# Patient Record
Sex: Male | Born: 1960 | Race: White | Hispanic: No | Marital: Married | Smoking: Former smoker
Health system: Southern US, Community
[De-identification: ages and names within clinical notes are randomized; demographics above are authoritative.]

## PROBLEM LIST (undated history)

## (undated) DIAGNOSIS — E669 Obesity, unspecified: Secondary | ICD-10-CM

## (undated) DIAGNOSIS — E119 Type 2 diabetes mellitus without complications: Secondary | ICD-10-CM

## (undated) DIAGNOSIS — E7800 Pure hypercholesterolemia, unspecified: Secondary | ICD-10-CM

## (undated) DIAGNOSIS — I2510 Atherosclerotic heart disease of native coronary artery without angina pectoris: Secondary | ICD-10-CM

## (undated) DIAGNOSIS — M1990 Unspecified osteoarthritis, unspecified site: Secondary | ICD-10-CM

## (undated) DIAGNOSIS — K219 Gastro-esophageal reflux disease without esophagitis: Secondary | ICD-10-CM

## (undated) DIAGNOSIS — I10 Essential (primary) hypertension: Secondary | ICD-10-CM

## (undated) HISTORY — PX: BACK SURGERY: SHX140

## (undated) HISTORY — DX: Gastro-esophageal reflux disease without esophagitis: K21.9

## (undated) HISTORY — PX: CORONARY ANGIOPLASTY WITH STENT PLACEMENT: SHX49

## (undated) HISTORY — PX: TIBIA HARDWARE REMOVAL: SUR1133

## (undated) HISTORY — PX: LEG SURGERY: SHX1003

## (undated) HISTORY — DX: Unspecified osteoarthritis, unspecified site: M19.90

---

## 1998-11-04 ENCOUNTER — Encounter: Admission: RE | Admit: 1998-11-04 | Discharge: 1999-02-02 | Payer: Self-pay | Admitting: Family Medicine

## 1999-06-10 ENCOUNTER — Encounter: Admission: RE | Admit: 1999-06-10 | Discharge: 1999-09-08 | Payer: Self-pay | Admitting: Family Medicine

## 1999-07-14 ENCOUNTER — Encounter: Payer: Self-pay | Admitting: Family Medicine

## 1999-07-14 ENCOUNTER — Encounter: Admission: RE | Admit: 1999-07-14 | Discharge: 1999-07-14 | Payer: Self-pay | Admitting: Family Medicine

## 1999-08-11 ENCOUNTER — Encounter: Admission: RE | Admit: 1999-08-11 | Discharge: 1999-08-11 | Payer: Self-pay | Admitting: Family Medicine

## 1999-08-11 ENCOUNTER — Encounter: Payer: Self-pay | Admitting: Family Medicine

## 1999-09-10 ENCOUNTER — Encounter: Payer: Self-pay | Admitting: Family Medicine

## 1999-09-10 ENCOUNTER — Encounter: Admission: RE | Admit: 1999-09-10 | Discharge: 1999-09-10 | Payer: Self-pay | Admitting: Family Medicine

## 1999-09-14 ENCOUNTER — Encounter: Admission: RE | Admit: 1999-09-14 | Discharge: 1999-09-14 | Payer: Self-pay | Admitting: Family Medicine

## 1999-09-14 ENCOUNTER — Encounter: Payer: Self-pay | Admitting: Family Medicine

## 2000-02-07 ENCOUNTER — Ambulatory Visit (HOSPITAL_COMMUNITY): Admission: RE | Admit: 2000-02-07 | Discharge: 2000-02-07 | Payer: Self-pay | Admitting: Neurosurgery

## 2000-02-07 ENCOUNTER — Encounter: Payer: Self-pay | Admitting: Neurosurgery

## 2001-09-19 HISTORY — PX: ORIF PROXIMAL TIBIAL PLATEAU FRACTURE: SUR953

## 2001-09-19 HISTORY — PX: DECOMPRESSION FASCIOTOMY LEG: SUR403

## 2001-12-07 ENCOUNTER — Encounter: Admission: RE | Admit: 2001-12-07 | Discharge: 2001-12-07 | Payer: Self-pay | Admitting: Gastroenterology

## 2001-12-07 ENCOUNTER — Encounter: Payer: Self-pay | Admitting: Gastroenterology

## 2002-03-13 ENCOUNTER — Encounter: Admission: RE | Admit: 2002-03-13 | Discharge: 2002-06-11 | Payer: Self-pay | Admitting: Family Medicine

## 2002-05-11 ENCOUNTER — Inpatient Hospital Stay (HOSPITAL_COMMUNITY): Admission: EM | Admit: 2002-05-11 | Discharge: 2002-05-14 | Payer: Self-pay | Admitting: Emergency Medicine

## 2002-05-11 ENCOUNTER — Encounter: Payer: Self-pay | Admitting: Orthopedic Surgery

## 2002-05-24 ENCOUNTER — Inpatient Hospital Stay (HOSPITAL_COMMUNITY): Admission: RE | Admit: 2002-05-24 | Discharge: 2002-05-29 | Payer: Self-pay | Admitting: Orthopedic Surgery

## 2002-05-24 ENCOUNTER — Encounter: Payer: Self-pay | Admitting: Orthopedic Surgery

## 2002-05-26 ENCOUNTER — Encounter: Payer: Self-pay | Admitting: Orthopedic Surgery

## 2002-08-09 ENCOUNTER — Inpatient Hospital Stay (HOSPITAL_COMMUNITY): Admission: RE | Admit: 2002-08-09 | Discharge: 2002-08-13 | Payer: Self-pay | Admitting: Orthopedic Surgery

## 2002-08-09 ENCOUNTER — Encounter: Payer: Self-pay | Admitting: Orthopedic Surgery

## 2005-05-20 HISTORY — PX: ANTERIOR CERVICAL DECOMP/DISCECTOMY FUSION: SHX1161

## 2005-06-02 ENCOUNTER — Inpatient Hospital Stay (HOSPITAL_COMMUNITY): Admission: RE | Admit: 2005-06-02 | Discharge: 2005-06-04 | Payer: Self-pay | Admitting: Specialist

## 2005-09-12 ENCOUNTER — Emergency Department (HOSPITAL_COMMUNITY): Admission: EM | Admit: 2005-09-12 | Discharge: 2005-09-13 | Payer: Self-pay | Admitting: Emergency Medicine

## 2007-06-20 HISTORY — PX: CARDIAC CATHETERIZATION: SHX172

## 2007-06-28 ENCOUNTER — Inpatient Hospital Stay (HOSPITAL_BASED_OUTPATIENT_CLINIC_OR_DEPARTMENT_OTHER): Admission: RE | Admit: 2007-06-28 | Discharge: 2007-06-28 | Payer: Self-pay | Admitting: Interventional Cardiology

## 2007-07-03 ENCOUNTER — Ambulatory Visit (HOSPITAL_COMMUNITY): Admission: RE | Admit: 2007-07-03 | Discharge: 2007-07-04 | Payer: Self-pay | Admitting: Interventional Cardiology

## 2007-07-19 ENCOUNTER — Encounter (HOSPITAL_COMMUNITY): Admission: RE | Admit: 2007-07-19 | Discharge: 2007-09-19 | Payer: Self-pay | Admitting: Interventional Cardiology

## 2007-09-20 ENCOUNTER — Encounter (HOSPITAL_COMMUNITY): Admission: RE | Admit: 2007-09-20 | Discharge: 2007-11-02 | Payer: Self-pay | Admitting: Interventional Cardiology

## 2007-11-03 ENCOUNTER — Encounter (HOSPITAL_COMMUNITY): Admission: RE | Admit: 2007-11-03 | Discharge: 2007-11-23 | Payer: Self-pay | Admitting: Interventional Cardiology

## 2008-07-08 ENCOUNTER — Emergency Department (HOSPITAL_COMMUNITY): Admission: EM | Admit: 2008-07-08 | Discharge: 2008-07-08 | Payer: Self-pay | Admitting: Emergency Medicine

## 2010-12-06 ENCOUNTER — Observation Stay (HOSPITAL_COMMUNITY)
Admission: RE | Admit: 2010-12-06 | Discharge: 2010-12-07 | Disposition: A | Discharge status: Home / Self Care | Payer: 59 | Source: Ambulatory Visit | Attending: Interventional Cardiology | Admitting: Interventional Cardiology

## 2010-12-06 DIAGNOSIS — I2510 Atherosclerotic heart disease of native coronary artery without angina pectoris: Principal | ICD-10-CM | POA: Insufficient documentation

## 2010-12-06 DIAGNOSIS — E785 Hyperlipidemia, unspecified: Secondary | ICD-10-CM | POA: Insufficient documentation

## 2010-12-06 DIAGNOSIS — I209 Angina pectoris, unspecified: Secondary | ICD-10-CM | POA: Insufficient documentation

## 2010-12-06 LAB — GLUCOSE, CAPILLARY
Glucose-Capillary: 112 mg/dL — ABNORMAL HIGH (ref 70–99)
Glucose-Capillary: 135 mg/dL — ABNORMAL HIGH (ref 70–99)

## 2010-12-07 LAB — BASIC METABOLIC PANEL
CO2: 27 mEq/L (ref 19–32)
GFR calc Af Amer: >60 mL/min (ref >60)
GFR calc non Af Amer: >60 mL/min (ref >60)
Potassium: 4 mEq/L (ref 3.5–5.1)
Sodium: 139 mEq/L (ref 135–145)

## 2010-12-07 LAB — PLATELET INHIBITION P2Y12
Platelet Function  P2Y12: 285 [PRU] (ref 194–418)
Platelet Function Baseline: 331 [PRU] (ref 194–418)

## 2010-12-07 LAB — CBC
HCT: 39.7 % (ref 39.0–52.0)
MCV: 84.3 fL (ref 78.0–100.0)
Platelets: 207 10*3/uL (ref 150–400)
RBC: 4.71 MIL/uL (ref 4.22–5.81)

## 2010-12-07 LAB — GLUCOSE, CAPILLARY: Glucose-Capillary: 131 mg/dL — ABNORMAL HIGH (ref 70–99)

## 2010-12-20 ENCOUNTER — Other Ambulatory Visit: Payer: Self-pay | Admitting: Interventional Cardiology

## 2010-12-20 ENCOUNTER — Encounter (HOSPITAL_COMMUNITY): Payer: 59 | Attending: Interventional Cardiology

## 2010-12-20 DIAGNOSIS — E785 Hyperlipidemia, unspecified: Secondary | ICD-10-CM | POA: Insufficient documentation

## 2010-12-20 DIAGNOSIS — I209 Angina pectoris, unspecified: Secondary | ICD-10-CM | POA: Insufficient documentation

## 2010-12-20 DIAGNOSIS — I2510 Atherosclerotic heart disease of native coronary artery without angina pectoris: Secondary | ICD-10-CM | POA: Insufficient documentation

## 2010-12-20 DIAGNOSIS — Z5189 Encounter for other specified aftercare: Secondary | ICD-10-CM | POA: Insufficient documentation

## 2010-12-20 DIAGNOSIS — Z9861 Coronary angioplasty status: Secondary | ICD-10-CM | POA: Insufficient documentation

## 2010-12-20 LAB — GLUCOSE, CAPILLARY
Glucose-Capillary: 244 mg/dL — ABNORMAL HIGH (ref 70–99)
Glucose-Capillary: 174 mg/dL — ABNORMAL HIGH (ref 70–99)

## 2010-12-22 ENCOUNTER — Other Ambulatory Visit: Payer: Self-pay | Admitting: Interventional Cardiology

## 2010-12-22 ENCOUNTER — Encounter (HOSPITAL_COMMUNITY): Payer: 59

## 2010-12-22 NOTE — Discharge Summary (Signed)
  NAME:  Troy Martinez, KINCAID NO.:  [id number]  MEDICAL RECORD NO.:  [id number]           PATIENT TYPE:  O  LOCATION:  6526                         FACILITY:  [facility]  PHYSICIAN:  Corky Crafts, MDDATE OF BIRTH:  17-Apr-1961  DATE OF ADMISSION:  12/06/2010 DATE OF DISCHARGE:  12/07/2010                              DISCHARGE SUMMARY   FINAL DIAGNOSES: 1. Coronary artery disease. 2. Hyperlipidemia.  PROCEDURE PERFORMED:  Cardiac catheterization with drug-eluting stent implantation of the proximal right coronary artery.  HOSPITAL COURSE:  The patient underwent cardiac catheterization from the right radial approach.  He had a 3.5 x 16 Promus Element stents placed in his proximal right coronary artery after an 80% stenosis was found. He subsequently had the stent postdilated to greater than 4 mm.  He tolerated the procedure well.  He was stable in the day following.  He did have a platelet function tests showing inadequate response to Plavix, therefore he was switched to Brilinta.  DISCHARGE MEDICATIONS: 1. Aspirin 81 mg daily. 2. Tylenol p.r.n. 3. Metformin 500 mg nightly, first dose on December 08, 2010. 4. Lisinopril 10 mg daily. 5. Nexium 20 mg daily. 6. Simvastatin 40 mg daily. 7. Multivitamin. 8. Bystolic 5 mg daily. 9. Fenofibrate 160 mg daily.  HOSPITAL LABORATORY DATA:  Creatinine 0.83 on the day of discharge. P2Y12 inhibition on Plavix only 12%.  Hemoglobin 12.9.  FOLLOWUP:  He will follow up with Dr. Eldridge Dace in 2 weeks.  Return to work on Thursday, December 09, 2010.  ACTIVITY:  Follow post-radial cath instructions.  DIET:  Low-sodium, heart-healthy diet.  OTHER INSTRUCTIONS:  He will pick up some samples of Brilinta in our office.  He will also join cardiac rehab.     Corky Crafts, MD     JSV/MEDQ  D:  12/07/2010  T:  12/08/2010  Job:  [id number]  Electronically Signed by Lance Muss MD on 12/22/2010 08:20:34 AM

## 2010-12-22 NOTE — Procedures (Signed)
NAME:  Troy Martinez, Troy Martinez NO.:  [id number]  MEDICAL RECORD NO.:  [id number]           PATIENT TYPE:  O  LOCATION:  6526                         FACILITY:  [facility]  PHYSICIAN:  Corky Crafts, MDDATE OF BIRTH:  September 03, 1961  DATE OF PROCEDURE:  12/06/2010 DATE OF DISCHARGE:  12/07/2010                           CARDIAC CATHETERIZATION   PROCEDURES PERFORMED: 1. Left heart catheterization. 2. Left ventriculogram. 3. Coronary angiogram. 4. PCI of the RCA.  PRIMARY CARE PHYSICIAN:  L. Lupe Carney, MD  OPERATOR:  Corky Crafts, MD  INDICATIONS:  Abnormal stress test and angina.  PROCEDURE NARRATIVE:  The risks and benefits of cardiac catheterization were explained to the patient and informed consent was obtained.  He was brought to the cath lab.  He was prepped and draped in usual sterile fashion.  His right wrist was infiltrated with 1% lidocaine.  A 5-French glide sheath was placed into the right radial artery using the modified Seldinger technique.  Right coronary artery angiography was performed using a JR-4 pigtail catheter.  Catheter was advanced to the vessel ostium under fluoroscopic guidance.  Digital angiography was performed in multiple projections using hand injection of contrast.  Left coronary artery angiography was performed using a JL-3.5 catheter in a similar fashion.  A pigtail catheter was advanced to the ascending aorta and across the aortic valve under fluoroscopic guidance.  Power injection of contrast was performed in the RAO projection to image the left ventricle.  Catheter was pulled back under continuous hemodynamic pressure monitoring.  The intervention was then performed.  Please see below for details.  Heparin was initially given after arterial access was obtained.  Angiomax was used for the intervention.  A TR band was applied for hemostasis.  FINDINGS:  The right coronary artery is a large dominant vessel.  There is an  80% ulcerated plaque in the proximal vessel and mild irregularities in the remainder of the vessel. The PDA is a medium-sized vessel. The posterolateral artery is small and patent. Left main is a short vessel and widely patent. The ramus vessel has moderate disease with up to sequential 50% stenosis in 1 view. Left circumflex is a large vessel.  There are mild luminal irregularities.  The stent in the distal vessel is widely patent.  AV continuation branch is small with a 50% lesion distally. Left anterior descending is a large vessel, wraps around the apex. There is mild-to-moderate proximal disease which appears unchanged when compared with prior catheterization from 2008.  There are mild luminal irregularities in the rest of the vessel.  The first diagonal is a medium-sized vessel and appears patent and the second diagonal is a large vessel and appears widely patent. Left ventriculogram reveals normal ventricular function with an estimated EF of 55%.  HEMODYNAMIC RESULTS:  Left ventricular pressure 101/0 with an LVEDP of 14 mmHg.  Aortic pressure 98/56 with a mean aortic pressure of 76 mmHg.  PCI NARRATIVE:  JR-4 guiding catheter was used.  A Prowater wire was placed across the area of disease in the proximal right coronary artery. A 3.0 x 12 Apex balloon was inflated at  10 atmospheres.  Subsequently, 3.5 x 16 Promus stent was placed across the diseased area and deployed at 18 atmospheres.  A 4.0 x 12 mm [place] Quantum Apex balloon was inflated at 18 atmospheres to postdilate the stent.  There was no residual stenosis.  TIMI-3 flow was maintained throughout the procedure.  IMPRESSION: 1. Significant proximal right coronary artery disease, successfully     stented with a drug-eluting stent, both the proximal and distal     portion of the stent were postdilated to greater than 4 mm with 2     separate balloon inflations. 2. Mild-to-moderate disease in the ramus and LAD.  The LAD  disease     appears stable from prior cath.  There has been some progression in     the ramus vessel disease. 3. Normal left ventricular function and normal hemodynamics.  RECOMMENDATIONS:  Continue aggressive secondary prevention.  He will need to stay on dual antiplatelet therapy.  We will check a platelet function testing since he has been on Plavix for sometime and now with a second drug-eluting stent, the plaque is somewhat ulcerated and I wanted to be sure that he does respond well to Plavix.  He will be watched overnight.     Corky Crafts, MD     JSV/MEDQ  D:  12/09/2010  T:  12/10/2010  Job:  [id number]  Electronically Signed by Lance Muss MD on 12/22/2010 08:22:11 AM

## 2010-12-24 ENCOUNTER — Encounter (HOSPITAL_COMMUNITY): Payer: 59

## 2010-12-27 ENCOUNTER — Encounter (HOSPITAL_COMMUNITY): Payer: 59

## 2010-12-29 ENCOUNTER — Encounter (HOSPITAL_COMMUNITY): Payer: 59

## 2010-12-31 ENCOUNTER — Encounter (HOSPITAL_COMMUNITY): Payer: 59

## 2010-12-31 ENCOUNTER — Other Ambulatory Visit: Payer: Self-pay | Admitting: Interventional Cardiology

## 2011-01-03 ENCOUNTER — Encounter (HOSPITAL_COMMUNITY): Payer: 59

## 2011-01-05 ENCOUNTER — Encounter (HOSPITAL_COMMUNITY): Payer: 59

## 2011-01-07 ENCOUNTER — Encounter (HOSPITAL_COMMUNITY): Payer: 59

## 2011-01-10 ENCOUNTER — Encounter (HOSPITAL_COMMUNITY): Payer: 59

## 2011-01-12 ENCOUNTER — Encounter (HOSPITAL_COMMUNITY): Payer: 59

## 2011-01-14 ENCOUNTER — Encounter (HOSPITAL_COMMUNITY): Payer: 59

## 2011-01-14 ENCOUNTER — Other Ambulatory Visit: Payer: Self-pay | Admitting: Interventional Cardiology

## 2011-01-17 ENCOUNTER — Encounter (HOSPITAL_COMMUNITY): Payer: 59

## 2011-01-19 ENCOUNTER — Encounter (HOSPITAL_COMMUNITY): Payer: 59 | Attending: Interventional Cardiology

## 2011-01-19 DIAGNOSIS — I209 Angina pectoris, unspecified: Secondary | ICD-10-CM | POA: Insufficient documentation

## 2011-01-19 DIAGNOSIS — I2510 Atherosclerotic heart disease of native coronary artery without angina pectoris: Secondary | ICD-10-CM | POA: Insufficient documentation

## 2011-01-19 DIAGNOSIS — Z9861 Coronary angioplasty status: Secondary | ICD-10-CM | POA: Insufficient documentation

## 2011-01-19 DIAGNOSIS — E785 Hyperlipidemia, unspecified: Secondary | ICD-10-CM | POA: Insufficient documentation

## 2011-01-19 DIAGNOSIS — Z5189 Encounter for other specified aftercare: Secondary | ICD-10-CM | POA: Insufficient documentation

## 2011-01-21 ENCOUNTER — Encounter (HOSPITAL_COMMUNITY): Payer: 59

## 2011-01-24 ENCOUNTER — Encounter (HOSPITAL_COMMUNITY): Payer: 59

## 2011-01-26 ENCOUNTER — Encounter (HOSPITAL_COMMUNITY): Payer: 59

## 2011-01-28 ENCOUNTER — Encounter (HOSPITAL_COMMUNITY): Payer: 59

## 2011-01-31 ENCOUNTER — Encounter (HOSPITAL_COMMUNITY): Payer: 59

## 2011-02-01 NOTE — Cardiovascular Report (Signed)
NAME:  Troy Martinez, Troy Martinez NO.:  [id number]   MEDICAL RECORD NO.:  [id number]          PATIENT TYPE:  OIB   LOCATION:  1961                         FACILITY:  [facility]   PHYSICIAN:  Corky Crafts, MDDATE OF BIRTH:  Feb 19, 1961   DATE OF PROCEDURE:  06/28/2007  DATE OF DISCHARGE:                            CARDIAC CATHETERIZATION   REFERRING PHYSICIAN:  Dr. Lupe Carney   PROCEDURE PERFORMED:  Left heart catheterization, left ventriculogram,  coronary angiogram, abdominal aortogram.   OPERATOR:  Dr. Eldridge Dace   INDICATIONS:  Abnormal stress test, angina.   PROCEDURE NARRATIVE:  The risks and benefits of cardiac catheterization  were explained to the patient and informed consent was obtained.  The  patient was brought to the catheterization laboratory.  He was prepped  and draped in the usual sterile fashion.  His right groin was  infiltrated with 1% lidocaine.  A 4-French arterial sheath was placed  into the right femoral artery using the modified Seldinger technique.  Left coronary artery angiography was performed using a JL-4.0 catheter.  The catheter was advanced through the vessel ostium under fluoroscopic  guidance.  Digital angiography was performed in multiple projections  using hand injection of contrast.  Right coronary artery angiography was  performed using a 3DRC catheter.  The catheter was advanced through the  vessel ostium under fluoroscopic guidance.  Visual angiography was  performed in multiple projections using hand injection of contrast.  A  pigtail catheter was then advanced through the ascending aorta and  across the aortic valve under fluoroscopic guidance.  A power injection  of contrast was performed in the 30-degree RAO position.  The catheter  was pulled back under continuous hemodynamic pressure monitoring.  The  catheter was then pulled back to the abdominal aorta at the level of the  renal arteries.  A power injection of contrast was  performed in the AP  projection to visualize the infrarenal abdominal aorta.  The sheath was  removed and manual compression was used for hemostasis.   FINDINGS:  The left main was a short vessel angiographically normal but  the left circumflex was a large vessel.  There were minor irregularities  throughout the proximal and mid vessel.  There is a 95% distal  circumflex lesion.  There is a large first obtuse marginal which had  minor irregularities.  There is a medium-sized second obtuse marginal  which originated just after the 95% distal circumflex lesion.  This OM-2  had minor irregularities.  The left anterior descending was a large  vessel with mild atherosclerosis throughout.  The first and second  diagonals were medium-sized vessels with mild luminal irregularities.  The right coronary artery was a large dominant vessel.  There was a 30%  proximal lesion.  The left ventriculogram showed normal LV function.  There was no significant mitral regurgitation   HEMODYNAMICS:  Left ventricular pressure 116/90 with an LVEDP of 13  mmHg.  Aortic pressure of 113/75 with a mean aortic pressure of 93 mmHg.  The abdominal aortogram showed single renal arteries bilaterally, both  of which were widely patent.  There was no abdominal aortic aneurysm.   IMPRESSION:  1. A 95% distal circumflex lesion which corresponds with the patient's      stress test abnormality.  2. Normal left ventricular function.  3. No renal artery stenosis.  4. Normal hemodynamics.   RECOMMENDATIONS:  We will start the patient on Plavix and plan for a PCI  of the distal circumflex next week.  The risks and benefits were  explained to the patient and he is willing to proceed.      Corky Crafts, MD  Electronically Signed     JSV/MEDQ  D:  06/28/2007  T:  06/28/2007  Job:  [id number]

## 2011-02-01 NOTE — Cardiovascular Report (Signed)
NAME:  Troy Martinez, Troy Martinez NO.:  [id number]   MEDICAL RECORD NO.:  [id number]          PATIENT TYPE:  OIB   LOCATION:  6525                         FACILITY:  [facility]   PHYSICIAN:  Corky Crafts, MDDATE OF BIRTH:  16-Jul-1961   DATE OF PROCEDURE:  07/03/2007  DATE OF DISCHARGE:                            CARDIAC CATHETERIZATION   REFERRING PHYSICIAN:  Dr. Lupe Carney.   PROCEDURES PERFORMED:  Percutaneous coronary intervention of the distal  left circumflex.   OPERATOR:  Corky Crafts, M.D.   INDICATIONS:  Stable angina, coronary artery disease, abnormal stress  test.   FINDINGS:  Diagnostic angiography revealed a 90% distal left circumflex  lesion.  A CLS guide was used to engage the ostium of the left main.  A  Prowater wire was placed across the lesion.  A  2 x 12 [place] balloon  was deployed across the lesion and inflated 8 atmospheres for 25  seconds.  A 2.5 x 18 mm Endeavor stent was then placed across the lesion  and deployed at 9 atmospheres for 30 seconds.  The proximal and mid  portion of the stent were post dilated with a 2.75 x 12-mm Quantum  balloon initially inflated at 16 atmospheres for 29 seconds.  The  balloon was then advanced slightly and inflated again to 14 atmospheres  for 17 seconds in the mid portion of the stent.  There was mild vessel  spasm noted distal to the stent.  This resolved with 100 mcg of  intracoronary nitroglycerin.  There is an excellent angiographic result  with no residual stenosis.  There is TIMI-3 flow at the end of the  procedure.   IMPRESSION:  1. Successful drug-eluting stent to the distal circumflex with a 2.5 x      18 mm Endeavor stent.  This was postdilated to 2.8 mm in diameter.  2. Excellent angiographic result.   RECOMMENDATIONS:  Continue aspirin and Plavix for 1 year along with  other secondary prevention.  I will follow up with the patient in the  office.  Angiomax was used for  anticoagulation.  The patient was already  on Plavix and he will be watched overnight.      Corky Crafts, MD  Electronically Signed     JSV/MEDQ  D:  07/03/2007  T:  07/04/2007  Job:  [id number]

## 2011-02-02 ENCOUNTER — Encounter (HOSPITAL_COMMUNITY): Payer: 59

## 2011-02-04 ENCOUNTER — Encounter (HOSPITAL_COMMUNITY): Payer: 59
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
## 2011-02-04 NOTE — Discharge Summary (Signed)
NAME:  [name], [name] NO.:  [id number]   MEDICAL RECORD NO.:  [id number]          PATIENT TYPE:  INP   LOCATION:  5008                         FACILITY:  [facility]   PHYSICIAN:  Troy Martinez, M.D.   DATE OF BIRTH:  11-Jun-1961   DATE OF ADMISSION:  06/02/2005  DATE OF DISCHARGE:  06/04/2005                                 DISCHARGE SUMMARY   ADMISSION DIAGNOSIS:  1.  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  2.  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  3.  Type 2 diabetes mellitus.   DISCHARGE DIAGNOSIS:  1.  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  2.  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  3.  Type 2 diabetes mellitus.   PROCEDURE:  On June 02, 2005 the patient underwent anterior cervical  diskectomy and fusion at C4-5 and C5-6 with right iliac crest bone graft  harvested through a separate fascial incision.  This was performed by Dr.  Otelia Sergeant, assisted by Maud Deed, P.A.-C. under general anesthesia.   CONSULTATIONS:  None.   BRIEF HISTORY:  Patient is a 50 year old right hand dominant male with  severe neck pain radiation into the left arm for the past month.  MRI study  demonstrated moderately large disk protrusion left-sided at C5-6 with  foraminal narrowing causing left C6 nerve root compression, also significant  spondylosis involving uncovertebral joint at the C4-5 with apparent C5 nerve  root compression as well.  Conservative treatment was attempted, however,  unsuccessful in giving him relief of his symptoms.  It was felt he would  need surgical intervention and was admitted for the procedure as stated  above.   BRIEF HOSPITAL COURSE:  Patient was seen preoperatively by Dr. Lupe Carney  and found to have minimal risk for surgery.  He underwent the procedure and  general anesthesia without complications.  Postoperatively he had  significant pain control issues.   Medications were adjusted utilizing IV  medications initially and weaning to oral medications including OxyContin  and OxyIR combination.  Valium was given for muscle relaxation as well.  Eventually, his pain was better controlled and he was able to utilize oral  analgesics only for his discomfort.  Drain was removed from the anterior  neck on the first postoperative day.  Dressing changes done daily thereafter  of the neck as well as the right hip showed wounds to be healing well.  Patient was seen by occupational therapy for ADLs and physical therapy for  ambulation and gait training utilizing a walker.  Patient was seen by  diabetic treatment team for evaluation and recommendations.  His blood sugar  was stable during the hospital stay.  Neurovascular and motor function of  the upper extremities remained intact throughout the hospital stay.  On  June 04, 2005 the patient was felt stable for transfer to home.   PERTINENT LABORATORY VALUES:  On admission CBC within normal limits.  Postoperative hemoglobin and hematocrit 12.6 and 38.8, respectively.  Coagulation studies on admission normal.  Chemistry studies on admission  with values normal range with the exception of AST 40 and ALT 67.  Urinalysis on admission showed moderate hemoglobin, otherwise within normal  limits.   PLANS:  Patient was discharged to his home.  He was given instructions to  change his dressing daily and he would be allowed to shower four to five  days after surgery if no drainage from his wound.  He was instructed to use  his [place] collar for showering purposes.  He will continue to use his  [place] collar at all times.  He was encouraged to use ice on the neck and hip  incision.  Patient was advised to continue on his diabetic diet utilizing  clear liquids until he had less sore throat.  He will continue to ambulate  using his walker.   DISCHARGE MEDICATIONS:  1.  OxyContin 20 mg q.12h.  2.  OxyIR  one to two every four to six hours as needed for pain.  3.  Robaxin 500 mg one every eight hours as needed for spasm.   He was instructed in no aspirin, Advil, or ibuprofen products.  He was  instructed in smoking cessation.  Patient will use over-the-counter stool  softeners or laxatives as needed.  He was advised to call to arrange an  appointment to see Dr. Otelia Sergeant two weeks from the date of surgery.  All  questions encouraged and answered.   CONDITION ON DISCHARGE:  Stable.      Troy Martinez, P.A.      Troy Martinez, M.D.  Electronically Signed    SMV/MEDQ  D:  08/08/2005  T:  08/08/2005  Job:  [id number]

## 2011-02-04 NOTE — Op Note (Signed)
NAME:  Troy Martinez, Troy Martinez NO.:  [id number]   MEDICAL RECORD NO.:  [id number]          PATIENT TYPE:  INP   LOCATION:  2864                         FACILITY:  [facility]   PHYSICIAN:  Kerrin Champagne, M.D.   DATE OF BIRTH:  [birth year]   DATE OF PROCEDURE:  06/02/2005  DATE OF DISCHARGE:                                 OPERATIVE REPORT   PREOPERATIVE DIAGNOSES:  1.  Herniated nucleus pulposus, left C5-C6, with severe left C6      radiculopathy.  2.  Spondylosis changes at the left side, C4-C5.   POSTOPERATIVE DIAGNOSES:  1.  Herniated nucleus pulposus, left C5-C6, with severe left C6      radiculopathy.  2.  Spondylosis changes at the left side, C4-C5.   PROCEDURES:  1.  Anterior cervical diskectomy and fusion at C4-C5 and C5-C6 with right      iliac crest bone graft harvested through a separate incision.  2.  Internal fixation from C4-C6 over two disc space levels using 45 mm      DePuy Slim Locking cervical plate with 14 mm screws x 4.   SURGEON:  Kerrin Champagne, M.D.   ASSISTANT:  Wende Neighbors, P.A.   ANESTHESIA:  GOT, Judie Petit, M.D. also supplemented with local  infiltration of the neck and right iliac crest, 5 cc each, with Marcaine  0.5% with 1:200,000 epinephrine.   SPECIMEN:  None.   ESTIMATED BLOOD LOSS:  75 cc.   DRAINS:  Foley to straight drain, TLS 10 French anterior neck.   COMPLICATIONS:  None.   The patient returned to the PACU in good condition.   HISTORY OF PRESENT ILLNESS:  The patient is a 50 year old male who is right-  hand dominant.  He has been experiencing severe neck pain, radiation into  his left arm for the past four weeks.  Initially seen by Dr. Jene Every  and evaluated with severe pain in the left arm, an abduction sign in which  pain was relieved by elevation of his arm.  Significant weakness in biceps,  triceps and pronation of the left forearm.  He underwent MRI study which  demonstrated a moderately large  disc protrusion, left-sided, at C5-C6 with  foraminal narrowing causing left C6 nerve root compression.  Also, was found  to have a significant spondylosis involving uncovertebral joint at C4-C5  with apparent C5 nerve root compression as well.  After undergoing a  preoperative evaluation and steroid management because of significant  weakness, the patient was brought to the operating room to undergo  anteriorly diskectomy and fusion.  He was requiring extreme amounts of  narcotic medication to alleviate pain.   INTRAOPERATIVE FINDINGS:  At least four free fragments of disc material were  found over the left side of the cord and left neural foramen for C6 causing  left C6 nerve root compression.  Significant degenerative disc disease of  both C4-C5 and C5-C6, spondylosis with uncovertebral hypertrophy,  particularly over the superior aspect of C5 into the left C4-C5 neural  foramen affecting the left C5 nerve root.   DESCRIPTION  OF PROCEDURE:  After adequate general anesthesia, the patient in  the beach chair position, bump under his right iliac crest, the neck in  slight extension with [place] horseshoe well-padded, 5 pounds cervical  halter traction applied.  Standard prep over the anterior neck after marking  the patient's skin lines for the expected incision and also the right iliac  crest, prepping over the iliac crest and anterior neck with DuraPrep  solution.  All pressure points were well-padded.  The skids were used to  hold the shoulders and arms well within the bed, well-padding each of the  upper extremities to prevent any nerve compression areas.  Standard TED hose  stockings for both lower extremities to prevent DVT.  Foley catheter placed  prior to the beginning of the procedure.  Following prep, the patient was  draped in the usual manner.   Incision was made over the anterior left neck at the expected C5 level after  palpation of carotid tuberosity, cricothyroid  cartilage also, through the  skin and subcutaneous layers.  The incision was approximately 3.5 inches in  length, down to the platysma layer.  This was incised in line with the skin  incision using electrocautery.  Metzenbaum scissors then used to spread the  fascial layers, developing the interval between the trachea and the  esophagus medially and the carotid sheath laterally, medial to the anterior  prevertebral space where the prevertebral fascia was encountered.  The  omohyoid muscle was preserved.  Retracting the esophagus and trachea with  hand-held Cloward, the medial border of the longus colli muscle was freed  from the prevertebral fascia using electrocautery and then teased across the  midline.  Needle was then placed in the expected disc space of C4-C5 and C5-  C6.  Intraoperative lateral radiograph obtained.  These did demonstrate the  needles at the expected C4-C5 and C5-C6 levels.   While the radiograph was being developed for the neck, right iliac crest  bone graft harvest site was exposed using approximately 3.5 inch incision  through skin and subcutaneous layers in line with the right anterolateral  iliac crest, approximately 3 inches posterior to the anterior superior iliac  spine through the skin and subcutaneous layers, down to the patient's  superior and lateral aspect of the iliac crest.  The periosteal dissection  was then taken medially and laterally, exposing the superomedial and lateral  aspects of the iliac crest anterolateral, about 2.5-3 inches posterior to  the anterior superior iliac spine.  This area was then packed later to  obtain bone graft.   Returning to the neck then, after finding the needle was at the expected C4-  C5 and C5-C6 level, under direct visualization using head lamp and loop  magnification, the needles were each removed individually and a portion of the anterior aspect of the disc excised above C4-C5 and C5-C6 using both 15  blade  scalpel and pituitary rongeur.  Following marking of these levels for  continued identification throughout the case, hand-held Clowards were  continued within the incision and the longus colli muscle was then freed up  on both sides of the anterior aspect of the cervical spine, extending from  the vertebral body of C4 to C6.  Freeing up using a T-elevator as well as  using electrocautery to remove any periosteum over the anterior aspect of  the cervical regions so that the plate later could be applied.   Attention then turned to the C5-C6 level where a Insurance claims handler  was  inserted with the blade beneath the medial border of the longus colli muscle  on each side, obtaining excellent visualization here.  A 14 mm screw post  was then inserted in the vertebral body of C5 and C6 and distraction  obtained across the disc space.  A 15 lade scalpel was then used to further  incise the anterior disc, pituitary rongeurs.  A Kerrison was then used to  debride disc material from the anterior aspect of the intervertebral disc  space, removing anterior lip osteophyte with Kerrisons.  Micro curets were  then used to debride further disc material within the intervertebral disc  space, excising and removing the cartilaginous end plates, inferior aspect  of C5 and superior aspect of C6.  High-speed bur used to further thin  posterior lip osteophytes, then 1 mm Kerrison introduced to excise posterior  lip osteophytes over the posterior superior aspect of C6 and posterior  inferior aspect of C5.  The posterior annulus was resected in total.   Following this resection, noted to have disc material extending into the  left C6 neural foramen anterior to the posterior longitudinal ligament.  This was excised using micropituitary rongeurs after bringing the microscope  into the field sterilely.  While bringing the microscope into the field, a  portion of the microscope was noted to have a tear in it.  This  required a  changing of the instruments and the irrigation of the field.  Changing of  the outer gloves each.  This was noted early on after draping of the scope  and bringing it in so that I felt the procedure could continue without  changing a full set of instruments.   With this then continuing on, irrigation was performed of the surgical  operative sites.  The osteophytes of the posterior lip of C5-C6 were  resected with the disc space.  The posterior annulus resected.  The  posterior longitudinal ligament resected into the left C6 neural foramen.  At least four pieces of free disc material were excised from the posterior  left corner of the disc space into the neural foramen, affecting the left C6  nerve root. Following the resection and resection of the vertebral joint,  the C6 nerve root was noted to be exiting without further compression.  A  regular nerve hook could be passed out the foramen easily.  Turning the right side, additional disc material was noted to be herniated  into the right C6 neural foramen through a rent in the right posterior  longitudinal ligament.  This was excised.  The uncovertebral joint was  excised on the right side and the spinal canal was felt to be fully  decompressed at this point.  Measuring the depth with the Cloward depth cage  at 17 mm, a height at 8 mm using sounders, a #7 Green sounder.   A dual oscillating saw measuring 8 mm was then used to obtain iliac crest  bone graft from the right side, protecting the soft tissue structures  medially with Cobbs and outer layers with hand-held Clowards and the [employer].  The base of the graft was then cut following the use of the dual  oscillating saw, using a 1/4-inch curved osteotome.  This was carefully  tapered to the dimensions of the intervertebral disc space.  It was made  into a J-graft as it was quite wide, 8 mm in height with a depth of about 14  mm was chosen. The graft was  carefully  tapered and rounded in order to allow  for keying into the disc space, and this was performed after first  irrigation of the intervertebral disc space.  Hemostasis with thrombin-  soaked Gelfoam.  Removing the thrombin-soaked Gelfoam, the graft was then  impacted into place carefully with distraction in place, insuring that there  was no soft tissue present that could be retropulsed through the insertion.   Following this, then, the screw post at the C6 level was removed.  Bleeding  from the screw post hole controlled using bone wax.  The McCullough  retractors were then replaced at the C4-C5 level for the blade to lie  beneath the medial border of the longus colli muscle on both sides.  Careful  retraction obtained.  A 14 mm screw post was then placed into the vertebral  body of C4 and distraction obtained across the C4-C5 disc space.  Similarly,  then, the anterior portion of the anulus was excised using a 15 blade  scalpel, pituitary rongeurs, curets.  A Kerrison was used to debride the  disc space of degenerative disc disease.  The cartilaginous end plates were  then resected over the inferior aspect of C4, superior aspect of C6 and C5,  back to the posterior lip osteophytes which were carefully burred using high-  speed drill and removed using 1 and 2 mm Kerrisons across their base,  excising posterior lip osteophytes.  Over the left side over the superior  posterior lip of C5, in particular, a rather large osteophyte affected to be  affecting the C5 nerve root.  The left C5 neural foramen was then carefully  decompressed, excising uncovertebral joint here posteriorly until the nerve  root was well decompressed. The height of the intervertebral disc space was  also measured and with a #7 sounder, a Green provided the best fit.  Again,  an 8 mm width.  Depth measured with a Cloward depth gauge again at 17 mm.  Graft obtained from the right iliac crest bone graft harvest site  again protecting the soft tissue structures medially and laterally using a dual  oscillating saw.  Base of the graft cut using a 1/4-inch osteotome.  This  was then tapered  to the dimensions of the intervertebral disc space.  Again, another J-graft was necessary, removing single portion of the  cortical bone on one side of the graft in order to allow for graft made less  wide, in order to be inserted into the disc space at the C4-C5 level and  fit.  The graft was then placed over the disc space and noted that the high-  speed bur was used to carefully decorticate the bone on either side of the  disc space down to bleeding cortical bone.  Graft then inserted, impacted  into place after careful inspection demonstrated there was no soft tissue  remaining that could be retropulsed with insertion of the bone graft.  Note  that the spinal canal was well-decompressed prior to the insertion of the  graft.  The graft was inserted, subset an additional 1-2 mm.  Note that the  depth of the graft, again, at 14 mm and height 8 mm.   Following insertion of this graft then, longitudinal retraction was released  on the neck.  Careful measurement obtained across the disc spaces.  A 45 mm  DePuy Slim Lock locking plate was then applied to the anterior aspect of the  cervical spine.  This then fixed into place superiorly  using a single  fixation pin.  The two middle screw holes at the C5 level were then first  placed using a 14 mm drill and a positive stop.  A 14 mm screw was first  placed on the left side at C5, and on the right at C5.  Then at the C4 level  on the left side, then right side after removal of the retaining pin.  Following this, attention turned to the C6 level where again 14 mm screws  were placed on left side then right side.  Each of the locking screws were  then turned, locking the plate to the screws at each level.   Intraoperative lateral radiograph then obtained with traction on the  arms to  allow for reviewing of the neck and this demonstrated placement of screws in  adequate position and alignment.  No evidence of retropulsion of graft  material.  The screw length appeared to be appropriate.  Restoration of  normal alignment of the cervical spine was present.  With this, irrigation  was performed.  The patient had some generalized oozing present.  The area  where the retention pin was placed did show some bleeding and this was bone  waxed through the hole and it was placed through in the plate.  Other areas  demonstrated oozing.  There were no bleeders present.  Examination of the  esophagus demonstrated no abnormalities.   A 10 French drain was then placed in the depth of the incision, exiting over  the anterior aspect of the neck just to the right side of the incision and  sewn into place with a 4-0 nylon stitch.  As there was no active bleeding  present, the incision in the neck was closed.  Right iliac crest bone graft harvest site was also closed, first applying bone wax to the bleeding  cancellous bone surfaces and removing excess bone wax and applying Gelfoam.  The abdominal fascia was approximated with proximal thigh fascia on the  right iliac crest bone graft harvest site using interrupted #1 Vicryl  sutures following irrigation there.  Deep subcutaneous layers were  approximated with #1 and 0 Vicryl sutures, more superficial layers with  interrupted 2-0 Vicryl sutures and skin closed with a running subcuticular  stitch of 4-0 Vicryl.  Tincture of benzoin and Steri-Strips applied here.  In the neck area, platysma layer was reapproximated with interrupted 3-0  Vicryl sutures.  The deep subcutaneous layers were approximated with  interrupted 3-0 Vicryl sutures and the skin closed with a running  subcutaneous stitch of 4-0 Vicryl.  Tincture of benzoin and Steri-Strips  applied.  The TLS drain charged to a red top tube.  [place] collar was  applied.  The  patient was then extubated and returned to the recovery room  in satisfactory condition.  All instrument and sponge counts were correct.      Kerrin Champagne, M.D.  Electronically Signed     JEN/MEDQ  D:  06/02/2005  T:  06/02/2005  Job:  [id number]

## 2011-02-07 ENCOUNTER — Encounter (HOSPITAL_COMMUNITY): Payer: 59

## 2011-02-09 ENCOUNTER — Encounter (HOSPITAL_COMMUNITY): Payer: 59

## 2011-02-11 ENCOUNTER — Encounter (HOSPITAL_COMMUNITY): Payer: 59

## 2011-02-14 ENCOUNTER — Encounter (HOSPITAL_COMMUNITY): Payer: 59

## 2011-02-16 ENCOUNTER — Encounter (HOSPITAL_COMMUNITY): Payer: 59

## 2011-02-18 ENCOUNTER — Encounter (HOSPITAL_COMMUNITY): Payer: 59 | Attending: Interventional Cardiology

## 2011-02-18 DIAGNOSIS — I209 Angina pectoris, unspecified: Secondary | ICD-10-CM | POA: Insufficient documentation

## 2011-02-18 DIAGNOSIS — I2510 Atherosclerotic heart disease of native coronary artery without angina pectoris: Secondary | ICD-10-CM | POA: Insufficient documentation

## 2011-02-18 DIAGNOSIS — Z9861 Coronary angioplasty status: Secondary | ICD-10-CM | POA: Insufficient documentation

## 2011-02-18 DIAGNOSIS — Z5189 Encounter for other specified aftercare: Secondary | ICD-10-CM | POA: Insufficient documentation

## 2011-02-18 DIAGNOSIS — E785 Hyperlipidemia, unspecified: Secondary | ICD-10-CM | POA: Insufficient documentation

## 2011-02-21 ENCOUNTER — Encounter (HOSPITAL_COMMUNITY): Payer: 59

## 2011-02-23 ENCOUNTER — Encounter (HOSPITAL_COMMUNITY): Payer: 59

## 2011-02-25 ENCOUNTER — Encounter (HOSPITAL_COMMUNITY): Payer: 59

## 2011-02-28 ENCOUNTER — Encounter (HOSPITAL_COMMUNITY): Payer: 59

## 2011-03-02 ENCOUNTER — Encounter (HOSPITAL_COMMUNITY): Payer: 59

## 2011-03-04 ENCOUNTER — Encounter (HOSPITAL_COMMUNITY): Payer: 59

## 2011-03-07 ENCOUNTER — Encounter (HOSPITAL_COMMUNITY): Payer: 59

## 2011-03-09 ENCOUNTER — Encounter (HOSPITAL_COMMUNITY): Payer: 59

## 2011-03-11 ENCOUNTER — Encounter (HOSPITAL_COMMUNITY): Payer: 59

## 2011-03-14 ENCOUNTER — Encounter (HOSPITAL_COMMUNITY): Payer: 59

## 2011-03-16 ENCOUNTER — Encounter (HOSPITAL_COMMUNITY): Payer: 59

## 2011-03-18 ENCOUNTER — Encounter (HOSPITAL_COMMUNITY): Payer: 59

## 2011-03-21 ENCOUNTER — Encounter (HOSPITAL_COMMUNITY): Payer: 59

## 2011-03-23 ENCOUNTER — Encounter (HOSPITAL_COMMUNITY): Payer: 59

## 2011-03-25 ENCOUNTER — Encounter (HOSPITAL_COMMUNITY): Payer: 59

## 2011-06-20 LAB — CBC
HCT: 41.1
Hemoglobin: 13.4
MCHC: 32.7
MCV: 83.2
Platelets: 225
RBC: 4.94
WBC: 5.7

## 2011-06-20 LAB — POCT CARDIAC MARKERS
CKMB, poc: <1 — ABNORMAL LOW
CKMB, poc: <1 — ABNORMAL LOW

## 2011-06-20 LAB — POCT I-STAT, CHEM 8
Creatinine, Ser: 0.9
Potassium: 4.1

## 2011-06-20 LAB — DIFFERENTIAL
Basophils Absolute: 0
Eosinophils Absolute: 0.1
Lymphs Abs: 1.4
Monocytes Relative: 7
Neutro Abs: 3.8
Neutrophils Relative %: 66

## 2011-06-29 LAB — CBC
MCHC: 32.7
Platelets: 210
RBC: 4.73

## 2011-06-29 LAB — BASIC METABOLIC PANEL
Calcium: 8.5
Chloride: 106
GFR calc Af Amer: >60
GFR calc non Af Amer: >60
Potassium: 4.2
Sodium: 139

## 2013-08-08 ENCOUNTER — Other Ambulatory Visit: Payer: Self-pay | Admitting: Gastroenterology

## 2013-08-08 DIAGNOSIS — R140 Abdominal distension (gaseous): Secondary | ICD-10-CM

## 2013-08-14 ENCOUNTER — Ambulatory Visit
Admission: RE | Admit: 2013-08-14 | Discharge: 2013-08-14 | Disposition: A | Discharge status: Home / Self Care | Payer: 59 | Source: Ambulatory Visit | Attending: Gastroenterology | Admitting: Gastroenterology

## 2013-08-14 DIAGNOSIS — R140 Abdominal distension (gaseous): Secondary | ICD-10-CM

## 2013-10-24 ENCOUNTER — Ambulatory Visit: Payer: 59 | Admitting: Interventional Cardiology

## 2013-11-04 ENCOUNTER — Ambulatory Visit: Payer: 59 | Admitting: Interventional Cardiology

## 2013-11-27 ENCOUNTER — Encounter: Payer: Self-pay | Admitting: General Surgery

## 2013-11-27 ENCOUNTER — Encounter: Payer: Self-pay | Admitting: Interventional Cardiology

## 2013-11-27 ENCOUNTER — Ambulatory Visit (INDEPENDENT_AMBULATORY_CARE_PROVIDER_SITE_OTHER): Payer: 59 | Admitting: Interventional Cardiology

## 2013-11-27 ENCOUNTER — Encounter (INDEPENDENT_AMBULATORY_CARE_PROVIDER_SITE_OTHER): Payer: Self-pay

## 2013-11-27 VITALS — BP 96/60 | HR 62 | Ht 68.0 in | Wt 210.0 lb

## 2013-11-27 DIAGNOSIS — I2510 Atherosclerotic heart disease of native coronary artery without angina pectoris: Secondary | ICD-10-CM

## 2013-11-27 DIAGNOSIS — E782 Mixed hyperlipidemia: Secondary | ICD-10-CM

## 2013-11-27 DIAGNOSIS — I10 Essential (primary) hypertension: Secondary | ICD-10-CM

## 2013-11-27 DIAGNOSIS — E669 Obesity, unspecified: Secondary | ICD-10-CM

## 2013-11-27 NOTE — Progress Notes (Signed)
Patient ID: Troy Martinez, male   DOB: 1960/12/21, 53 y.o.   MRN: [id number]    [address] [place], [place]  [address] Phone: [phone] Fax:  [phone]  Date:  11/27/2013   ID:  Troy Martinez, DOB 06-20-1961, MRN [id number]  PCP:  Desmond Dike, MD      History of Present Illness: Troy Martinez is a 53 y.o. male who has had CAD. He did a hiking trip in Jan 2014 where hiked 11 miles and had no chest/throat discomfort. He was limited by his knees.  Angina in the past has been pain in the throat and DOE with exertion. Minimal sx like that recently in the past few weeks, only in cold weather.  Used NTG one time with no change in sx. He had one episode of left shoulder soreness after working a lot in the yard. it has resolved. CAD/ASCVD:  Denies : Dizziness.  Dyspnea on exertion.  Fatigue.  Leg edema.  Orthopnea.  Palpitations.  Paroxysmal nocturnal dyspnea.  Syncope.  improvement with coughing.    Wt Readings from Last 3 Encounters:  11/27/13 210 lb (95.255 kg)     Past Medical History  Diagnosis Date  . GERD (gastroesophageal reflux disease)   . Diabetes     diet controlled  . Arthritis     Dr. Margaretha Sheffield    Current Outpatient Prescriptions  Medication Sig Dispense Refill  . acetaminophen (TYLENOL) 325 MG tablet Take 650 mg by mouth every 6 (six) hours as needed.      Marland Kitchen aspirin 81 MG tablet Take 81 mg by mouth daily.      Marland Kitchen atorvastatin (LIPITOR) 80 MG tablet Take 80 mg by mouth daily.      Marland Kitchen esomeprazole (NEXIUM) 20 MG capsule Take 20 mg by mouth daily at 12 noon.      . fenofibrate 160 MG tablet Take 160 mg by mouth daily.      Marland Kitchen glimepiride (AMARYL) 1 MG tablet Take 0.5 mg by mouth daily with breakfast.      . lisinopril (PRINIVIL,ZESTRIL) 10 MG tablet Take 10 mg by mouth daily.      . nebivolol (BYSTOLIC) 5 MG tablet Take 5 mg by mouth daily.      . nitroGLYCERIN (NITROSTAT) 0.4 MG SL tablet Place 0.4 mg under the tongue every 5 (five) minutes  as needed for chest pain.      . ONE TOUCH ULTRA TEST test strip 1 each.       . sitaGLIPtin-metformin (JANUMET) 50-1000 MG per tablet Take 1 tablet by mouth 2 (two) times daily with a meal.       No current facility-administered medications for this visit.    Allergies:   No Known Allergies  Social History:  The patient  reports that he has never smoked. He does not have any smokeless tobacco history on file. He reports that he does not drink alcohol or use illicit drugs.   Family History:  The patient's family history is not on file.   ROS:  Please see the history of present illness.  No nausea, vomiting.  No fevers, chills.  No focal weakness.  No dysuria.   All other systems reviewed and negative.   PHYSICAL EXAM: VS:  BP 96/60  Pulse 62  Ht 5\' 8"  (1.727 m)  Wt 210 lb (95.255 kg)  BMI 31.94 kg/m2 Well nourished, well developed, in no acute distress HEENT: normal Neck: no JVD, no carotid bruits  Cardiac:  normal S1, S2; RRR;  Lungs:  clear to auscultation bilaterally, no wheezing, rhonchi or rales Abd: soft, nontender, no hepatomegaly WUJ:WJXBJExt:right leg edema Skin: warm and dry Neuro:   no focal abnormalities noted  EKG:  NSR, rSR', mo ST segment changes     ASSESSMENT AND PLAN:  Coronary atherosclerosis of native coronary artery  Increase Aspirin Tablet Delayed Release, 81 MG, 2 tablets, Orally, Once a day Stopped Effient Tablet, 10 MG, 1 tablet, Orally, once a day, 90 days, 90 bruising is better  Notes: No typical angina. Changed Brilinta to Effient and SHOB better. Plavix resistant in the past. Encouraged him to try to modify risk factors with diet and exercise.  No chest pain. THroat pain as noted above. Continue to follow. It has been over a year since his last stent. He will let us know symptoms persist or get worse. I would've a low threshold to perform a repeat angiogram. He did have a negative nuclear stress test in April 2014.    2. Mixed hyperlipidemia  Refill  Fenofibrate Tablet, 160 MG, 1 tablet, Orally, every evening, 90 days, 90, Refills 3 Continue Atorvastatin Calcium Tablet, 80 MG, 1 tablet, Orally, Once a day Notes: Controlled. Lipids checked by his primary care Dr.    3. Essential hypertension, benign  Continue Lisinopril Tablet, 10 MG, 1 tablet, Orally, Once a day Refill Bystolic Tablet, 5 MG, 1 tablet Once a day, orally, Once a day, 90, Refills 3 Notes: Controlled. He has some sx of low BP. He will get dizzy and fatigued, and will feel better with eating salt and ice. He does not check his BP at home. Try to stay well hydrated. Check readings at home. COuld consider stopping bystolic if readings are low.  Target blood pressure was 100  To 130 systolic   4. Obesity, unspecified  Notes: Continue to try to lose weight with diet and exercise. He has lost some weight. He has tried a fresh juice diet. He started weight watchers.    Preventive Medicine  Adult topics discussed:  Diet: healthy diet, low calorie, low fat.  Exercise: 5 days a week, at least 30 minutes of aerobic exercise.      Signed, Fredric MareJay S. Marlyce Mcdougald, MD, [facility] 11/27/2013 4:34 PM

## 2013-11-27 NOTE — Patient Instructions (Signed)
Your physician has requested that you regularly monitor and record your blood pressure readings at home. Please use the same machine at the same time of day to check your readings and record them. Systolic should range from 100-130 (top number). If the systolic is out of this range call to let us know.  Your physician recommends that you continue on your current medications as directed. Please refer to the Current Medication list given to you today.  Your physician wants you to follow-up in: 6 months with Dr. Eldridge DaceVaranasi.  You will receive a reminder letter in the mail two months in advance. If you don't receive a letter, please call our office to schedule the follow-up appointment.

## 2013-12-16 ENCOUNTER — Telehealth: Payer: Self-pay | Admitting: Interventional Cardiology

## 2013-12-16 MED ORDER — RANOLAZINE ER 500 MG PO TB12: 500.0000 mg | ORAL_TABLET | Freq: Two times a day (BID) | ORAL | Status: DC

## 2013-12-16 NOTE — Telephone Encounter (Signed)
New message     Blood pressure was 95/57 and 98/57.  Pt is supposed to call if it drops under 100.  Please advise

## 2013-12-16 NOTE — Telephone Encounter (Addendum)
Spoke with pt and he is feeling okay, but he feels just extremely tired. Pt has checked BP daily over the weekend. Pts BP was low yesterday morning and yesterday afternoon 90's/50's. Pts Bp this am was 120/57. Pt denies dizziness and lighthedtedness. Pt has had several more instances of where his throat tightens as well.

## 2013-12-16 NOTE — Telephone Encounter (Signed)
Per Dr. Eldridge DaceVaranasi add Ranexa 500 mg BID to current regimen and call if symptoms persist. Continue to monitor BP.

## 2013-12-16 NOTE — Telephone Encounter (Signed)
Pt notified and samples upfront

## 2013-12-16 NOTE — Telephone Encounter (Signed)
Lmtrc, I need to know if pt is symptomatic.

## 2013-12-17 MED ORDER — LISINOPRIL 10 MG PO TABS: 5.0000 mg | ORAL_TABLET | Freq: Every day | ORAL | Status: DC

## 2013-12-17 NOTE — Telephone Encounter (Signed)
Pt.notified

## 2013-12-17 NOTE — Telephone Encounter (Signed)
Decrease lisinopril to 5 mg daily to see if BP increases.  Would like systolic between [id number].

## 2013-12-17 NOTE — Telephone Encounter (Signed)
Lmtrc, meds updated.

## 2013-12-17 NOTE — Addendum Note (Signed)
Addended byOrlene Plum: Christabelle Hanzlik H on: 12/17/2013 01:30 PM   Modules accepted: Orders

## 2014-03-31 ENCOUNTER — Telehealth: Payer: Self-pay | Admitting: Interventional Cardiology

## 2014-03-31 DIAGNOSIS — R072 Precordial pain: Secondary | ICD-10-CM

## 2014-03-31 DIAGNOSIS — R070 Pain in throat: Secondary | ICD-10-CM

## 2014-03-31 NOTE — Telephone Encounter (Signed)
New message          Pt is extremely tired and has had some pains in his body / pt has a recall for Sept but would like to be worked in to be seen sooner

## 2014-03-31 NOTE — Telephone Encounter (Signed)
Spoke with pt and he has felt extremely tired since last OV. Pt denies SOB. Pt will intermittently have chest pressure after exertion about twice a week. Pt has also noticed occurences of "pouring sweat" with light exertion outside, but it has been hot. He also has some occasional sweating while just sitting at his desk at work. Pts BP consistently runs 110's/60's with a HR of 70-80's.

## 2014-04-02 NOTE — Telephone Encounter (Signed)
Already on 2 antianginals.  If he is still having regular chest pressure, would go straight to cath.

## 2014-04-02 NOTE — Telephone Encounter (Signed)
If he feels sx are similar to what he had before previous stents, can schedule cath to further evaluate.

## 2014-04-03 ENCOUNTER — Encounter: Payer: Self-pay | Admitting: Cardiology

## 2014-04-03 ENCOUNTER — Other Ambulatory Visit: Payer: Self-pay | Admitting: Interventional Cardiology

## 2014-04-03 ENCOUNTER — Other Ambulatory Visit (INDEPENDENT_AMBULATORY_CARE_PROVIDER_SITE_OTHER): Payer: 59

## 2014-04-03 ENCOUNTER — Encounter (HOSPITAL_COMMUNITY): Payer: Self-pay | Admitting: Pharmacy Technician

## 2014-04-03 DIAGNOSIS — R070 Pain in throat: Secondary | ICD-10-CM

## 2014-04-03 DIAGNOSIS — I209 Angina pectoris, unspecified: Secondary | ICD-10-CM

## 2014-04-03 DIAGNOSIS — R072 Precordial pain: Secondary | ICD-10-CM

## 2014-04-03 LAB — CBC WITH DIFFERENTIAL/PLATELET
Basophils Absolute: 0 10*3/uL (ref 0.0–0.1)
Basophils Relative: 0.2 % (ref 0.0–3.0)
EOS PCT: 1 % (ref 0.0–5.0)
Eosinophils Absolute: 0.1 10*3/uL (ref 0.0–0.7)
HCT: 38.4 % — ABNORMAL LOW (ref 39.0–52.0)
Hemoglobin: 12.6 g/dL — ABNORMAL LOW (ref 13.0–17.0)
Lymphocytes Relative: 16.2 % (ref 12.0–46.0)
Lymphs Abs: 1.5 10*3/uL (ref 0.7–4.0)
MCHC: 33 g/dL (ref 30.0–36.0)
MCV: 85.3 fl (ref 78.0–100.0)
MONOS PCT: 8.9 % (ref 3.0–12.0)
Monocytes Absolute: 0.8 10*3/uL (ref 0.1–1.0)
NEUTROS PCT: 73.7 % (ref 43.0–77.0)
Neutro Abs: 6.8 10*3/uL (ref 1.4–7.7)
PLATELETS: 230 10*3/uL (ref 150.0–400.0)
RBC: 4.5 Mil/uL (ref 4.22–5.81)
RDW: 14.5 % (ref 11.5–15.5)
WBC: 9.3 10*3/uL (ref 4.0–10.5)

## 2014-04-03 LAB — BASIC METABOLIC PANEL
BUN: 18 mg/dL (ref 6–23)
CO2: 28 meq/L (ref 19–32)
Calcium: 9.2 mg/dL (ref 8.4–10.5)
Chloride: 100 mEq/L (ref 96–112)
Creatinine, Ser: 0.9 mg/dL (ref 0.4–1.5)
GFR: 98.9 mL/min (ref >60.00)
GLUCOSE: 90 mg/dL (ref 70–99)
POTASSIUM: 3.8 meq/L (ref 3.5–5.1)
SODIUM: 136 meq/L (ref 135–145)

## 2014-04-03 LAB — PROTIME-INR
INR: 1.1 ratio — ABNORMAL HIGH (ref 0.8–1.0)
Prothrombin Time: 11.7 s (ref 9.6–13.1)

## 2014-04-03 NOTE — Telephone Encounter (Addendum)
Spoke with pt and I offered to try and work him in with the PA today but he feels he can wait a few weeks. Pt does feel that he is having symptoms like he did prior to his previous stents. He feels that they are not as significant as what he has had in the past. He also told me that he has been off Ranexa for over a month because he became extremely fatgiued while taking it and it didn't help the tightness in his throat. Pt still has fatigue and he would be willing to go back on Ranexa if Dr. Eldridge DaceVaranasi would like for him to. Appt made for 04/14/14 for Pre-cath Work-up.

## 2014-04-03 NOTE — Telephone Encounter (Signed)
Spoke with Dr. Eldridge DaceVaranasi and he thinks pt should go ahead with heart cath tomorrow. Cath scheduled for 04/04/14 at 9 am. Pt notified and instructions given. Pt will come for stat labs today. H&P will be done at the hospital in the am per Dr. Eldridge DaceVaranasi.

## 2014-04-04 ENCOUNTER — Other Ambulatory Visit: Payer: Self-pay

## 2014-04-04 ENCOUNTER — Ambulatory Visit (HOSPITAL_COMMUNITY)
Admission: RE | Admit: 2014-04-04 | Discharge: 2014-04-05 | Disposition: A | Discharge status: Home / Self Care | Payer: 59 | Source: Ambulatory Visit | Attending: Interventional Cardiology | Admitting: Interventional Cardiology

## 2014-04-04 ENCOUNTER — Encounter (HOSPITAL_COMMUNITY): Payer: Self-pay | Admitting: General Practice

## 2014-04-04 ENCOUNTER — Encounter (HOSPITAL_COMMUNITY)
Admission: RE | Disposition: A | Discharge status: Home / Self Care | Payer: Self-pay | Source: Ambulatory Visit | Attending: Interventional Cardiology

## 2014-04-04 DIAGNOSIS — E669 Obesity, unspecified: Secondary | ICD-10-CM | POA: Diagnosis present

## 2014-04-04 DIAGNOSIS — I209 Angina pectoris, unspecified: Secondary | ICD-10-CM

## 2014-04-04 DIAGNOSIS — E119 Type 2 diabetes mellitus without complications: Secondary | ICD-10-CM | POA: Diagnosis present

## 2014-04-04 DIAGNOSIS — I25119 Atherosclerotic heart disease of native coronary artery with unspecified angina pectoris: Secondary | ICD-10-CM | POA: Diagnosis present

## 2014-04-04 DIAGNOSIS — K219 Gastro-esophageal reflux disease without esophagitis: Secondary | ICD-10-CM | POA: Diagnosis present

## 2014-04-04 DIAGNOSIS — E782 Mixed hyperlipidemia: Secondary | ICD-10-CM | POA: Diagnosis present

## 2014-04-04 DIAGNOSIS — Z6832 Body mass index (BMI) 32.0-32.9, adult: Secondary | ICD-10-CM | POA: Insufficient documentation

## 2014-04-04 DIAGNOSIS — I10 Essential (primary) hypertension: Secondary | ICD-10-CM | POA: Diagnosis present

## 2014-04-04 DIAGNOSIS — M129 Arthropathy, unspecified: Secondary | ICD-10-CM | POA: Insufficient documentation

## 2014-04-04 DIAGNOSIS — Z9861 Coronary angioplasty status: Secondary | ICD-10-CM | POA: Insufficient documentation

## 2014-04-04 DIAGNOSIS — I2510 Atherosclerotic heart disease of native coronary artery without angina pectoris: Secondary | ICD-10-CM | POA: Insufficient documentation

## 2014-04-04 DIAGNOSIS — I200 Unstable angina: Secondary | ICD-10-CM

## 2014-04-04 DIAGNOSIS — Z7982 Long term (current) use of aspirin: Secondary | ICD-10-CM | POA: Insufficient documentation

## 2014-04-04 HISTORY — PX: PERCUTANEOUS STENT INTERVENTION: SHX5500

## 2014-04-04 HISTORY — PX: CORONARY ANGIOPLASTY WITH STENT PLACEMENT: SHX49

## 2014-04-04 HISTORY — DX: Type 2 diabetes mellitus without complications: E11.9

## 2014-04-04 HISTORY — DX: Pure hypercholesterolemia, unspecified: E78.00

## 2014-04-04 HISTORY — PX: LEFT HEART CATHETERIZATION WITH CORONARY ANGIOGRAM: SHX5451

## 2014-04-04 HISTORY — DX: Essential (primary) hypertension: I10

## 2014-04-04 HISTORY — DX: Atherosclerotic heart disease of native coronary artery without angina pectoris: I25.10

## 2014-04-04 HISTORY — DX: Obesity, unspecified: E66.9

## 2014-04-04 LAB — GLUCOSE, CAPILLARY
GLUCOSE-CAPILLARY: 128 mg/dL — AB (ref 70–99)
Glucose-Capillary: 143 mg/dL — ABNORMAL HIGH (ref 70–99)
Glucose-Capillary: 130 mg/dL — ABNORMAL HIGH (ref 70–99)
Glucose-Capillary: 123 mg/dL — ABNORMAL HIGH (ref 70–99)

## 2014-04-04 LAB — PLATELET COUNT: Platelets: 215 10*3/uL (ref 150–400)

## 2014-04-04 LAB — POCT ACTIVATED CLOTTING TIME: Activated Clotting Time: 298 seconds

## 2014-04-04 SURGERY — LEFT HEART CATHETERIZATION WITH CORONARY ANGIOGRAM
Anesthesia: LOCAL

## 2014-04-04 MED ORDER — ATORVASTATIN CALCIUM 80 MG PO TABS
80.0000 mg | ORAL_TABLET | Freq: Every day | ORAL | Status: DC
  Administered 2014-04-04: 80 mg via ORAL
  Filled 2014-04-04 (×2): qty 1

## 2014-04-04 MED ORDER — GLIMEPIRIDE 1 MG PO TABS
1.0000 mg | ORAL_TABLET | Freq: Every day | ORAL | Status: DC
  Administered 2014-04-05: 1 mg via ORAL
  Filled 2014-04-04 (×2): qty 1

## 2014-04-04 MED ORDER — ACETAMINOPHEN 325 MG PO TABS: 650.0000 mg | ORAL_TABLET | Freq: Four times a day (QID) | ORAL | Status: DC | PRN

## 2014-04-04 MED ORDER — LINAGLIPTIN 5 MG PO TABS
5.0000 mg | ORAL_TABLET | Freq: Every day | ORAL | Status: DC
  Administered 2014-04-04: 5 mg via ORAL
  Filled 2014-04-04 (×2): qty 1

## 2014-04-04 MED ORDER — ASPIRIN 81 MG PO CHEW
CHEWABLE_TABLET | ORAL | Status: AC
  Administered 2014-04-04: 81 mg via ORAL
  Filled 2014-04-04: qty 1

## 2014-04-04 MED ORDER — HEPARIN SODIUM (PORCINE) 1000 UNIT/ML IJ SOLN
INTRAMUSCULAR | Status: AC
  Filled 2014-04-04: qty 1

## 2014-04-04 MED ORDER — ASPIRIN 81 MG PO CHEW
81.0000 mg | CHEWABLE_TABLET | Freq: Every day | ORAL | Status: DC
  Filled 2014-04-04: qty 1

## 2014-04-04 MED ORDER — TIROFIBAN HCL IV 5 MG/100ML
INTRAVENOUS | Status: AC
  Filled 2014-04-04: qty 100

## 2014-04-04 MED ORDER — PRASUGREL HCL 10 MG PO TABS
10.0000 mg | ORAL_TABLET | Freq: Every day | ORAL | Status: DC
  Filled 2014-04-04 (×2): qty 1

## 2014-04-04 MED ORDER — ASPIRIN 81 MG PO CHEW
162.0000 mg | CHEWABLE_TABLET | Freq: Every day | ORAL | Status: DC
  Filled 2014-04-04: qty 2

## 2014-04-04 MED ORDER — ONDANSETRON HCL 4 MG/2ML IJ SOLN: 4.0000 mg | Freq: Four times a day (QID) | INTRAMUSCULAR | Status: DC | PRN

## 2014-04-04 MED ORDER — SODIUM CHLORIDE 0.9 % IJ SOLN: 3.0000 mL | Freq: Two times a day (BID) | INTRAMUSCULAR | Status: DC

## 2014-04-04 MED ORDER — SODIUM CHLORIDE 0.9 % IV SOLN
INTRAVENOUS | Status: DC
  Administered 2014-04-04: 08:00:00 via INTRAVENOUS

## 2014-04-04 MED ORDER — PRASUGREL HCL 10 MG PO TABS
ORAL_TABLET | ORAL | Status: AC
  Filled 2014-04-04: qty 6

## 2014-04-04 MED ORDER — NEBIVOLOL HCL 5 MG PO TABS
5.0000 mg | ORAL_TABLET | Freq: Every day | ORAL | Status: DC
  Administered 2014-04-04: 5 mg via ORAL
  Filled 2014-04-04 (×2): qty 1

## 2014-04-04 MED ORDER — NITROGLYCERIN 0.4 MG SL SUBL: 0.4000 mg | SUBLINGUAL_TABLET | SUBLINGUAL | Status: DC | PRN

## 2014-04-04 MED ORDER — PANTOPRAZOLE SODIUM 40 MG PO TBEC
40.0000 mg | DELAYED_RELEASE_TABLET | Freq: Every day | ORAL | Status: DC
  Filled 2014-04-04: qty 1

## 2014-04-04 MED ORDER — SODIUM CHLORIDE 0.9 % IV SOLN: INTRAVENOUS | Status: AC

## 2014-04-04 MED ORDER — SODIUM CHLORIDE 0.9 % IJ SOLN: 3.0000 mL | INTRAMUSCULAR | Status: DC | PRN

## 2014-04-04 MED ORDER — ASPIRIN 81 MG PO TABS: 162.0000 mg | ORAL_TABLET | Freq: Every day | ORAL | Status: DC

## 2014-04-04 MED ORDER — HEPARIN (PORCINE) IN NACL 2-0.9 UNIT/ML-% IJ SOLN
INTRAMUSCULAR | Status: AC
  Filled 2014-04-04: qty 1500

## 2014-04-04 MED ORDER — VERAPAMIL HCL 2.5 MG/ML IV SOLN
INTRAVENOUS | Status: AC
  Filled 2014-04-04: qty 2

## 2014-04-04 MED ORDER — FENOFIBRATE 160 MG PO TABS
160.0000 mg | ORAL_TABLET | Freq: Every day | ORAL | Status: DC
  Administered 2014-04-04: 160 mg via ORAL
  Filled 2014-04-04 (×2): qty 1

## 2014-04-04 MED ORDER — SODIUM CHLORIDE 0.9 % IV SOLN: 250.0000 mL | INTRAVENOUS | Status: DC | PRN

## 2014-04-04 MED ORDER — ASPIRIN 81 MG PO CHEW
81.0000 mg | CHEWABLE_TABLET | ORAL | Status: AC
  Administered 2014-04-04: 81 mg via ORAL

## 2014-04-04 MED ORDER — FENTANYL CITRATE 0.05 MG/ML IJ SOLN
INTRAMUSCULAR | Status: AC
  Filled 2014-04-04: qty 2

## 2014-04-04 MED ORDER — NITROGLYCERIN 1 MG/10 ML FOR IR/CATH LAB
INTRA_ARTERIAL | Status: AC
  Filled 2014-04-04: qty 10

## 2014-04-04 MED ORDER — ACETAMINOPHEN 325 MG PO TABS: 650.0000 mg | ORAL_TABLET | ORAL | Status: DC | PRN

## 2014-04-04 MED ORDER — LIDOCAINE HCL (PF) 1 % IJ SOLN
INTRAMUSCULAR | Status: AC
  Filled 2014-04-04: qty 30

## 2014-04-04 MED ORDER — MIDAZOLAM HCL 2 MG/2ML IJ SOLN
INTRAMUSCULAR | Status: AC
  Filled 2014-04-04: qty 2

## 2014-04-04 MED ORDER — LISINOPRIL 5 MG PO TABS
5.0000 mg | ORAL_TABLET | Freq: Every day | ORAL | Status: DC
  Filled 2014-04-04 (×2): qty 1

## 2014-04-04 NOTE — Progress Notes (Signed)
Patient arrived to Holding w/Aggrastat infusion at 0.6715mcg/kg/min

## 2014-04-04 NOTE — Progress Notes (Signed)
Wife in to see. Eating [place]key sandwich. Waiting on 6C bed assignment

## 2014-04-04 NOTE — Progress Notes (Signed)
Dr. Eldridge DaceVaranasi made aware of radial hematoma. Aggrastat drip turned off per order.

## 2014-04-04 NOTE — CV Procedure (Signed)
PROCEDURE:  Left heart catheterization with selective coronary angiography, left ventriculogram.  PCI Ramus intermedius  INDICATIONS:  Angina  The risks, benefits, and details of the procedure were explained to the patient.  The patient verbalized understanding and wanted to proceed.  Informed written consent was obtained.  PROCEDURE TECHNIQUE:  After Xylocaine anesthesia a 59F slender sheath was placed in the right radial artery with a single anterior needle wall stick.  IV heparin was given. Right coronary angiography was done using a Judkins R4 guide catheter.  Left coronary angiography was done using a Judkins L3.5 guide catheter.  Left ventriculography was done using a pigtail catheter. The intervention was performed. Please see below for details. A TR band was used for hemostasis.   CONTRAST:  Total of 175 cc.  COMPLICATIONS:  None.    HEMODYNAMICS:  Aortic pressure was 101/58; LV pressure was 101/8; LVEDP 15.  There was no gradient between the left ventricle and aorta.    ANGIOGRAPHIC DATA:   The left main coronary artery is a short vessel that is widely patent.  The left anterior descending artery is a large vessel which reaches the apex. There is mild disease in the proximal to mid LAD. There is a large septal branch which is patent. The.  The left circumflex artery is a large vessel. The stents in the distal circumflex is widely patent. The large obtuse marginal is widely patent. The circumflex beyond the large marginal is quite small. There is a 80% lesion with TIMI 3 flow beyond in the terminal branch.  This vessel was too small for intervention. There is a large ramus vessel. There is a 75% proximal stenosis. There is a 50% mid vessel stenosis. The proximal lesion appears somewhat ulcerated. It is significantly worse, paired to the previous film from 2012.  The right coronary artery is a large, dominant vessel. The proximal stent is widely patent. There is mild  atherosclerosis throughout the RCA.  The posterior descending artery is medium-sized and patent.  LEFT VENTRICULOGRAM:  Left ventricular angiogram was done in the 30 RAO projection and revealed normal left ventricular wall motion and systolic function with an estimated ejection fraction of 55%.  LVEDP was 15 mmHg.  PCI NARRATIVE: A CLS 3.0 guiding catheter was used to engage the left main. Additional heparin was given. IV tirofiban was given. ACT was used to check that the anticoagulation is therapeutic. A pro-water wire was placed into the circumflex for support. A BMW wire was placed into the large ramus vessel across the area disease. A 2.5 x 15 balloon was used to predilate both areas of focal disease. A 2.5 x 32 Promus drug-eluting stent was then deployed. The stent was post dilated with a 2.5 x 20 noncompliant balloon. There appeared to be a proximal edge dissection. A 2.5 x 8 promus drug-eluting stent was then deployed in overlapping fashion. There was an excellent angiographic result.  Several doses of intra-coronary nitroglycerin were given.  The patient did have his anginal symptoms during balloon inflations like he was having at home.  IMPRESSIONS:  1. Normal left main coronary artery. 2. Mild disease in the left anterior descending artery and its branches. 3. Mild disease in the left circumflex artery and its branches.  Patent stent in the distal circumflex.  Successful PCI to a large ramus branch with a 2.5 x 32 promus drug-eluting stent overlapping a 2.5 x 8 promus drug-eluting stent 4. Mild  Disease in the right coronary  artery.  Patent stent in the proximal vessel. 5. Normal left ventricular systolic function.  LVEDP 15 mmHg.  Ejection fraction 55%.  RECOMMENDATION:  Continue dual antiplatelet therapy for at least a year without interruption. We will use Effient for now because he had resistance to Plavix in the past. He will be watched overnight.  IV tirofiban continue for an hour.  Continue aggressive medical therapy.Marland Kitchen

## 2014-04-04 NOTE — H&P (Signed)
Troy Martinez is an 53 y.o. male.   Primary Cardiologist: PMD: Chief Complaint:  chest pain HPI: 53 y/o with multiple PCI in the past.  He has been having increased sweating and chest pain similar to what he had prior to his stents.  Sx have been getting gworse.  No relief with ranexa and beta blocker combination.  He is referred for LHC.  Past Medical History  Diagnosis Date  . GERD (gastroesophageal reflux disease)   . Diabetes     diet controlled  . Arthritis     Dr. Margaretha Sheffield    Past Surgical History  Procedure Laterality Date  . Cardiac catheterization  06/2007    DES in distal circumflex ([facility]) w normal LV function 11/2010 DES to RCA-Promus    Family History  Problem Relation Age of Onset  . Heart disease Father    Social History:  reports that he has never smoked. He does not have any smokeless tobacco history on file. He reports that he does not drink alcohol or use illicit drugs.  Allergies: No Known Allergies  Medications Prior to Admission  Medication Sig Dispense Refill  . acetaminophen (TYLENOL) 325 MG tablet Take 650 mg by mouth every 6 (six) hours as needed for mild pain, fever or headache.       Marland Kitchen aspirin 81 MG tablet Take 162 mg by mouth daily.       Marland Kitchen atorvastatin (LIPITOR) 80 MG tablet Take 80 mg by mouth daily.      Marland Kitchen esomeprazole (NEXIUM) 20 MG capsule Take 20 mg by mouth daily at 12 noon.      . fenofibrate 160 MG tablet Take 160 mg by mouth daily.      Marland Kitchen glimepiride (AMARYL) 1 MG tablet Take 1 mg by mouth daily with breakfast.       . lisinopril (PRINIVIL,ZESTRIL) 5 MG tablet Take 5 mg by mouth daily.      . nebivolol (BYSTOLIC) 5 MG tablet Take 5 mg by mouth daily.      . nitroGLYCERIN (NITROSTAT) 0.4 MG SL tablet Place 0.4 mg under the tongue every 5 (five) minutes as needed for chest pain.      . ONE TOUCH ULTRA TEST test strip 1 each.       . sitaGLIPtin-metformin (JANUMET) 50-1000 MG per tablet Take 1 tablet by mouth 2 (two) times daily with a  meal.        Results for orders placed in visit on 04/03/14 (from the past 48 hour(s))  CBC WITH DIFFERENTIAL     Status: Abnormal   Collection Time    04/03/14 12:10 PM      Result Value Ref Range   WBC 9.3  4.0 - 10.5 K/uL   RBC 4.50  4.22 - 5.81 Mil/uL   Hemoglobin 12.6 (*) 13.0 - 17.0 g/dL   HCT 09.8 (*) 11.9 - 14.7 %   MCV 85.3  78.0 - 100.0 fl   MCHC 33.0  30.0 - 36.0 g/dL   RDW 82.9  56.2 - 13.0 %   Platelets 230.0  150.0 - 400.0 K/uL   Neutrophils Relative % 73.7  43.0 - 77.0 %   Lymphocytes Relative 16.2  12.0 - 46.0 %   Monocytes Relative 8.9  3.0 - 12.0 %   Eosinophils Relative 1.0  0.0 - 5.0 %   Basophils Relative 0.2  0.0 - 3.0 %   Neutro Abs 6.8  1.4 - 7.7 K/uL   Lymphs Abs 1.5  0.7 - 4.0 K/uL   Monocytes Absolute 0.8  0.1 - 1.0 K/uL   Eosinophils Absolute 0.1  0.0 - 0.7 K/uL   Basophils Absolute 0.0  0.0 - 0.1 K/uL  BASIC METABOLIC PANEL     Status: None   Collection Time    04/03/14 12:10 PM      Result Value Ref Range   Sodium 136  135 - 145 mEq/L   Potassium 3.8  3.5 - 5.1 mEq/L   Chloride 100  96 - 112 mEq/L   CO2 28  19 - 32 mEq/L   Glucose, Bld 90  70 - 99 mg/dL   BUN 18  6 - 23 mg/dL   Creatinine, Ser 0.9  0.4 - 1.5 mg/dL   Calcium 9.2  8.4 - 04.510.5 mg/dL   GFR 40.9898.90  >11.91>60.00 mL/min  PROTIME-INR     Status: Abnormal   Collection Time    04/03/14 12:10 PM      Result Value Ref Range   INR 1.1 (*) 0.8 - 1.0 ratio   Prothrombin Time 11.7  9.6 - 13.1 sec   No results found.  ROS: As above; all others negative  OBJECTIVE:   Vitals:  There were no vitals filed for this visit. pending I&O's:  No intake or output data in the 24 hours ending 04/04/14 0718 TELEMETRY: Reviewed telemetry pt in :     PHYSICAL EXAM General: Well developed, well nourished, in no acute distress Head:   Normal cephalic and atramatic  Lungs:   Clear bilaterally to auscultation. Heart:   HRRR S1 S2  No JVD.   Abdomen: abdomen soft and non-tender Msk:  Back normal,   Normal strength and tone for age. Extremities:   No edema.   Neuro: Alert and oriented. Psych:  Normal affect, responds appropriately  LABS: Basic Metabolic Panel:  Recent Labs  47/82/9507/16/15 1210  NA 136  K 3.8  CL 100  CO2 28  GLUCOSE 90  BUN 18  CREATININE 0.9  CALCIUM 9.2   Liver Function Tests: No results found for this basename: AST, ALT, ALKPHOS, BILITOT, PROT, ALBUMIN,  in the last 72 hours No results found for this basename: LIPASE, AMYLASE,  in the last 72 hours CBC:  Recent Labs  04/03/14 1210  WBC 9.3  NEUTROABS 6.8  HGB 12.6*  HCT 38.4*  MCV 85.3  PLT 230.0   Cardiac Enzymes: No results found for this basename: CKTOTAL, CKMB, CKMBINDEX, TROPONINI,  in the last 72 hours BNP: No components found with this basename: POCBNP,  D-Dimer: No results found for this basename: DDIMER,  in the last 72 hours Hemoglobin A1C: No results found for this basename: HGBA1C,  in the last 72 hours Fasting Lipid Panel: No results found for this basename: CHOL, HDL, LDLCALC, TRIG, CHOLHDL, LDLDIRECT,  in the last 72 hours Thyroid Function Tests: No results found for this basename: TSH, T4TOTAL, FREET3, T3FREE, THYROIDAB,  in the last 72 hours Anemia Panel: No results found for this basename: VITAMINB12, FOLATE, FERRITIN, TIBC, IRON, RETICCTPCT,  in the last 72 hours Coag Panel:   Lab Results  Component Value Date   INR 1.1* 04/03/2014       Assessment/Plan Sx concerning for angina in a patient with known CAD.  Plan for cath +/- PCI.  VARANASI,JAYADEEP S. 04/04/2014, 7:18 AM

## 2014-04-04 NOTE — Progress Notes (Signed)
TR BAND REMOVAL  LOCATION:  right radial  DEFLATED PER PROTOCOL:  Yes.    TIME BAND OFF / DRESSING APPLIED:   1500    SITE UPON ARRIVAL:   Level 1  SITE AFTER BAND REMOVAL:  Level 1  REVERSE ALLEN'S TEST:    positive  CIRCULATION SENSATION AND MOVEMENT:  Within Normal Limits  Yes.    COMMENTS:

## 2014-04-04 NOTE — Interval H&P Note (Signed)
Cath Lab Visit (complete for each Cath Lab visit)  Clinical Evaluation Leading to the Procedure:   ACS: No.  Non-ACS:    Anginal Classification: CCS III  Anti-ischemic medical therapy: Maximal Therapy (2 or more classes of medications)  Non-Invasive Test Results: No non-invasive testing performed  Prior CABG: No previous CABG      History and Physical Interval Note:  04/04/2014 8:49 AM  Troy Martinez  has presented today for surgery, with the diagnosis of fatigue, throat tightness  The various methods of treatment have been discussed with the patient and family. After consideration of risks, benefits and other options for treatment, the patient has consented to  Procedure(s): LEFT HEART CATHETERIZATION WITH CORONARY ANGIOGRAM (N/A) as a surgical intervention .  The patient's history has been reviewed, patient examined, no change in status, stable for surgery.  I have reviewed the patient's chart and labs.  Questions were answered to the patient's satisfaction.     Nidal Rivet S.

## 2014-04-04 NOTE — Progress Notes (Signed)
Right radial site continued to ooze throughout afternoon.  Per Dr. Excell Seltzerooper, TR band applied at 1830 with 6 cc of air, will be titrated and DC'd for hemostasis tonite.

## 2014-04-05 ENCOUNTER — Encounter (HOSPITAL_COMMUNITY): Payer: Self-pay | Admitting: Physician Assistant

## 2014-04-05 DIAGNOSIS — I25119 Atherosclerotic heart disease of native coronary artery with unspecified angina pectoris: Secondary | ICD-10-CM | POA: Diagnosis present

## 2014-04-05 DIAGNOSIS — E119 Type 2 diabetes mellitus without complications: Secondary | ICD-10-CM | POA: Diagnosis present

## 2014-04-05 DIAGNOSIS — I10 Essential (primary) hypertension: Secondary | ICD-10-CM

## 2014-04-05 DIAGNOSIS — I200 Unstable angina: Secondary | ICD-10-CM

## 2014-04-05 DIAGNOSIS — I209 Angina pectoris, unspecified: Secondary | ICD-10-CM

## 2014-04-05 DIAGNOSIS — K219 Gastro-esophageal reflux disease without esophagitis: Secondary | ICD-10-CM | POA: Diagnosis present

## 2014-04-05 LAB — CBC
HEMATOCRIT: 36.9 % — AB (ref 39.0–52.0)
Hemoglobin: 11.8 g/dL — ABNORMAL LOW (ref 13.0–17.0)
MCH: 27.4 pg (ref 26.0–34.0)
MCHC: 32 g/dL (ref 30.0–36.0)
MCV: 85.8 fL (ref 78.0–100.0)
Platelets: 219 10*3/uL (ref 150–400)
RBC: 4.3 MIL/uL (ref 4.22–5.81)
RDW: 14.1 % (ref 11.5–15.5)
WBC: 8.1 10*3/uL (ref 4.0–10.5)

## 2014-04-05 LAB — BASIC METABOLIC PANEL
Anion gap: 13 (ref 5–15)
BUN: 17 mg/dL (ref 6–23)
CALCIUM: 8.7 mg/dL (ref 8.4–10.5)
CO2: 25 meq/L (ref 19–32)
Chloride: 101 mEq/L (ref 96–112)
Creatinine, Ser: 0.91 mg/dL (ref 0.50–1.35)
GFR calc Af Amer: >90 mL/min (ref >90)
GFR calc non Af Amer: >90 mL/min (ref >90)
GLUCOSE: 122 mg/dL — AB (ref 70–99)
Potassium: 4 mEq/L (ref 3.7–5.3)
Sodium: 139 mEq/L (ref 137–147)

## 2014-04-05 LAB — GLUCOSE, CAPILLARY: GLUCOSE-CAPILLARY: 125 mg/dL — AB (ref 70–99)

## 2014-04-05 MED ORDER — SITAGLIPTIN PHOS-METFORMIN HCL 50-1000 MG PO TABS: 1.0000 | ORAL_TABLET | Freq: Two times a day (BID) | ORAL | Status: DC

## 2014-04-05 MED ORDER — PRASUGREL HCL 10 MG PO TABS: 10.0000 mg | ORAL_TABLET | Freq: Every day | ORAL | Status: DC

## 2014-04-05 MED ORDER — ASPIRIN 81 MG PO TABS: 81.0000 mg | ORAL_TABLET | Freq: Every day | ORAL | Status: DC

## 2014-04-05 MED ORDER — NITROGLYCERIN 0.4 MG SL SUBL: 0.4000 mg | SUBLINGUAL_TABLET | SUBLINGUAL | Status: DC | PRN

## 2014-04-05 NOTE — Discharge Summary (Signed)
Discharge Summary   Patient ID: Troy Martinez MRN: [id number], DOB/AGE: 03-21-1961 53 y.o. Admit date: 04/04/2014 D/C date:     53/18/2015  Primary Cardiologist: Dr. Eldridge Dace  Principal Problem:   Unstable angina Active Problems:   Mixed hyperlipidemia   Essential hypertension, benign   Obesity  Body mass index is 32.46 kg/(m^2).   Coronary artery disease   GERD (gastroesophageal reflux disease)   Type II diabetes mellitus  Discharge Diagnosis: [place] s/p LHC with successful placement of overlapping DESx2 to large ramus on 04/04/14  HPI: Troy Martinez is a 53 y.o. male with a history of GERD, DM, HTN, HLD, and CAD s/p DES to LCx (2008) and DES to RCA (2012) who presented to the office with symptoms concerning for unstable angina. He was admitted for elective cardiac catheterization.   In the office he reported having increased sweating and chest pain similar to what he had prior to his stents and the symptoms had bee getting worse. He had no relief with ranexa and beta blocker combination and was therefore referred for LHC.   Hospital Course: He underwent LHC on 04/04/14 which revealed  IMPRESSIONS:  1. Normal left main coronary artery. 2. Mild disease in the left anterior descending artery and its branches. 3. Mild disease in the left circumflex artery and its branches. Patent stent in the distal circumflex. Successful PCI to a large ramus branch with a 2.5 x 32 promus drug-eluting stent overlapping a 2.5 x 8 promus drug-eluting stent 4. Mild Disease in the right coronary artery. Patent stent in the proximal vessel. 5. Normal left ventricular systolic function. LVEDP 15 mmHg. Ejection fraction 55%. It has been recommended that he continue DAPT for at least a year without interruption. It was chosen to use Effient for now because he had resistance to Plavix in the past.  The patient has had an uncomplicated hospital course and is recovering well. The radial catheter site is stable.  He has been seen by Dr. Elease Hashimoto today and deemed ready for discharge home. A voicemail was left with the office to call this patient to make a TOC appointment ( office closed on weekends). Discharge medications are listed below and include ASA/Effient, statin, BB, SL NTG and ACE. He will restart his sitaGLIPtin-metformin on 04/07/14 which is >48 hours post contrast dye exposure.   Discharge Vitals: Blood pressure 106/54, pulse 56, temperature 98 F (36.7 C), temperature source Oral, resp. rate 16, height 5\' 8"  (1.727 m), weight 213 lb 6.5 oz (96.8 kg), SpO2 97.00%.  Labs: Lab Results  Component Value Date   WBC 8.1 04/05/2014   HGB 11.8* 04/05/2014   HCT 36.9* 04/05/2014   MCV 85.8 04/05/2014   PLT 219 04/05/2014     Recent Labs Lab 04/05/14 0340  NA 139  K 4.0  CL 101  CO2 25  BUN 17  CREATININE 0.91  CALCIUM 8.7  GLUCOSE 122*     Diagnostic Studies/Procedures   LHC 04/04/14  PROCEDURE: Left heart catheterization with selective coronary angiography, left ventriculogram. PCI Ramus intermedius  INDICATIONS: Angina  The risks, benefits, and details of the procedure were explained to the patient. The patient verbalized understanding and wanted to proceed. Informed written consent was obtained.  PROCEDURE TECHNIQUE: After Xylocaine anesthesia a 16F slender sheath was placed in the right radial artery with a single anterior needle wall stick. IV heparin was given. Right coronary angiography was done using a Judkins R4 guide catheter. Left coronary angiography was done using  a Judkins L3.5 guide catheter. Left ventriculography was done using a pigtail catheter. The intervention was performed. Please see below for details. A TR band was used for hemostasis.  CONTRAST: Total of 175 cc.  COMPLICATIONS: None.  HEMODYNAMICS: Aortic pressure was 101/58; LV pressure was 101/8; LVEDP 15. There was no gradient between the left ventricle and aorta.  ANGIOGRAPHIC DATA: The left main coronary artery is  a short vessel that is widely patent.  The left anterior descending artery is a large vessel which reaches the apex. There is mild disease in the proximal to mid LAD. There is a large septal branch which is patent. The.  The left circumflex artery is a large vessel. The stents in the distal circumflex is widely patent. The large obtuse marginal is widely patent. The circumflex beyond the large marginal is quite small. There is a 80% lesion with TIMI 3 flow beyond in the terminal branch. This vessel was too small for intervention.  There is a large ramus vessel. There is a 75% proximal stenosis. There is a 50% mid vessel stenosis. The proximal lesion appears somewhat ulcerated. It is significantly worse, paired to the previous film from 2012.  The right coronary artery is a large, dominant vessel. The proximal stent is widely patent. There is mild atherosclerosis throughout the RCA. The posterior descending artery is medium-sized and patent.  LEFT VENTRICULOGRAM: Left ventricular angiogram was done in the 30 RAO projection and revealed normal left ventricular wall motion and systolic function with an estimated ejection fraction of 55%. LVEDP was 15 mmHg.  PCI NARRATIVE: A CLS 3.0 guiding catheter was used to engage the left main. Additional heparin was given. IV tirofiban was given. ACT was used to check that the anticoagulation is therapeutic. A pro-water wire was placed into the circumflex for support. A BMW wire was placed into the large ramus vessel across the area disease. A 2.5 x 15 balloon was used to predilate both areas of focal disease. A 2.5 x 32 Promus drug-eluting stent was then deployed. The stent was post dilated with a 2.5 x 20 noncompliant balloon. There appeared to be a proximal edge dissection. A 2.5 x 8 promus drug-eluting stent was then deployed in overlapping fashion. There was an excellent angiographic result. Several doses of intra-coronary nitroglycerin were given. The patient did have  his anginal symptoms during balloon inflations like he was having at home.  IMPRESSIONS:  6. Normal left main coronary artery. 7. Mild disease in the left anterior descending artery and its branches. 8. Mild disease in the left circumflex artery and its branches. Patent stent in the distal circumflex. Successful PCI to a large ramus branch with a 2.5 x 32 promus drug-eluting stent overlapping a 2.5 x 8 promus drug-eluting stent 9. Mild Disease in the right coronary artery. Patent stent in the proximal vessel. 10. Normal left ventricular systolic function. LVEDP 15 mmHg. Ejection fraction 55%. RECOMMENDATION: Continue dual antiplatelet therapy for at least a year without interruption. We will use Effient for now because he had resistance to Plavix in the past. He will be watched overnight. IV tirofiban continue for an hour. Continue aggressive medical therapy.Marland Kitchen.    Discharge Medications     Medication List         acetaminophen 325 MG tablet  Commonly known as:  TYLENOL  Take 650 mg by mouth every 6 (six) hours as needed for mild pain, fever or headache.     aspirin 81 MG tablet  Take 1 tablet (  81 mg total) by mouth daily.     atorvastatin 80 MG tablet  Commonly known as:  LIPITOR  Take 80 mg by mouth daily.     esomeprazole 20 MG capsule  Commonly known as:  NEXIUM  Take 20 mg by mouth daily at 12 noon.     fenofibrate 160 MG tablet  Take 160 mg by mouth daily.     glimepiride 1 MG tablet  Commonly known as:  AMARYL  Take 1 mg by mouth daily with breakfast.     lisinopril 5 MG tablet  Commonly known as:  PRINIVIL,ZESTRIL  Take 5 mg by mouth daily.     nebivolol 5 MG tablet  Commonly known as:  BYSTOLIC  Take 5 mg by mouth daily.     nitroGLYCERIN 0.4 MG SL tablet  Commonly known as:  NITROSTAT  Place 1 tablet (0.4 mg total) under the tongue every 5 (five) minutes as needed for chest pain.     ONE TOUCH ULTRA TEST test strip  Generic drug:  glucose blood  1 each.       prasugrel 10 MG Tabs tablet  Commonly known as:  EFFIENT  Take 1 tablet (10 mg total) by mouth daily.     sitaGLIPtin-metformin 50-1000 MG per tablet  Commonly known as:  JANUMET  Take 1 tablet by mouth 2 (two) times daily with a meal.  Start taking on:  04/07/2014        Disposition   The patient will be discharged in stable condition to home.  Follow-up Information   Follow up with Corky Crafts., MD On 04/14/2014. (The office will call to make an appointment in the next 1-2 weeks. Please contact them if you do not hear from them by Tuesday morning )    Specialty:  Interventional Cardiology   Contact information:   [address] [place] [address] [phone]         Duration of Discharge Encounter: Greater than 30 minutes including physician and PA time.  SignedVenetia Maxon, KATHRYN PA-C 04/05/2014, 9:20 AM  Attending Note:   The patient was seen and examined.  Agree with assessment and plan as noted above.  Changes made to the above note as needed.  See my note from earlier today.  Vesta Mixer, Montez Hageman., MD, [facility] 04/05/2014, 12:47 PM

## 2014-04-05 NOTE — Progress Notes (Addendum)
PROGRESS NOTE  Subjective:   Troy Martinez is a 53 year old gentleman who was admitted And angioplasty /stenting of his ramus intermediate vessel. He's done very well. He was noted to have a very small 80% stenosis in the terminal branch of his left circumflex artery. This branch is too small for intervention.  I talked with him while he was ambulating today. He denies any chest pain or shortness breath. He's ready for discharge.  Objective:    Vital Signs:   Temp:  [97.2 F (36.2 C)-98.6 F (37 C)] 98 F (36.7 C) (07/18 0742) Pulse Rate:  [53-82] 56 (07/18 0742) Resp:  [15-18] 16 (07/18 0742) BP: (95-129)/(50-78) 106/54 mmHg (07/18 0742) SpO2:  [93 %-99 %] 97 % (07/18 0742) Weight:  [213 lb 6.5 oz (96.8 kg)] 213 lb 6.5 oz (96.8 kg) (07/18 0009)  Last BM Date: 04/04/14   24-hour weight change: Weight change:   Weight trends: Filed Weights   04/04/14 0727 04/05/14 0009  Weight: 210 lb (95.255 kg) 213 lb 6.5 oz (96.8 kg)    Intake/Output:  07/17 0701 - 07/18 0700 In: 1792.5 [P.O.:980; I.V.:812.5] Out: 2500 [Urine:2500]     Physical Exam: BP 106/54  Pulse 56  Temp(Src) 98 F (36.7 C) (Oral)  Resp 16  Ht 5\' 8"  (1.727 m)  Wt 213 lb 6.5 oz (96.8 kg)  BMI 32.46 kg/m2  SpO2 97%  Wt Readings from Last 3 Encounters:  04/05/14 213 lb 6.5 oz (96.8 kg)  04/05/14 213 lb 6.5 oz (96.8 kg)  11/27/13 210 lb (95.255 kg)    General: Vital signs reviewed and noted.   Head: Normocephalic, atraumatic.  Eyes: conjunctivae/corneas clear.  EOM's intact.   Throat: normal  Neck:  normal   Lungs:    clear  Heart:  RR, normal S1S2  Abdomen:  Soft, non-tender, non-distended    Extremities:  normal,  Right radial cath site looks great  Neurologic: A&O X3, CN II - XII are grossly intact.   Psych: Normal     Labs: BMET:  Recent Labs  04/03/14 1210 04/05/14 0340  NA 136 139  K 3.8 4.0  CL 100 101  CO2 28 25  GLUCOSE 90 122*  BUN 18 17  CREATININE 0.9 0.91  CALCIUM  9.2 8.7    Liver function tests: No results found for this basename: AST, ALT, ALKPHOS, BILITOT, PROT, ALBUMIN,  in the last 72 hours No results found for this basename: LIPASE, AMYLASE,  in the last 72 hours  CBC:  Recent Labs  04/03/14 1210 04/04/14 2007 04/05/14 0340  WBC 9.3  --  8.1  NEUTROABS 6.8  --   --   HGB 12.6*  --  11.8*  HCT 38.4*  --  36.9*  MCV 85.3  --  85.8  PLT 230.0 215 219    Cardiac Enzymes: No results found for this basename: CKTOTAL, CKMB, TROPONINI,  in the last 72 hours  Coagulation Studies:  Recent Labs  04/03/14 1210  LABPROT 11.7  INR 1.1*     Other results:  Tele:  Sinus brady at 56  Medications:    Infusions:    Scheduled Medications: . aspirin  162 mg Oral Daily  . aspirin  81 mg Oral Daily  . atorvastatin  80 mg Oral Daily  . fenofibrate  160 mg Oral Daily  . glimepiride  1 mg Oral Q breakfast  . linagliptin  5 mg Oral Daily  . lisinopril  5 mg Oral  Daily  . nebivolol  5 mg Oral Daily  . pantoprazole  40 mg Oral Daily  . prasugrel  10 mg Oral Daily    Assessment/ Plan:   Active Problems:   Other and unspecified angina pectoris  Status post PTCA/stenting of his ramus intermediate vessel. He's done very well.  He has a very tight stenosis in the terminal aspect is also complex artery.   B. discharge on aspirin 81 mg a day Fenofibrate 160 mg a day Amaryl 1 mg a day Tradjenta  5 mg a day Lisinopril 5 mg date Bystolic 5 mg a day Atorvastatin 80 mg a day Nitroglycerin as needed Prasugrel  10 mg a day Protonix 40 mg a day he'll follow up with Dr. Eldridge DaceVaranasi in the office in several weeks  2. Hypertension: His blood pressures were stable. Continue with current medications.  3.   hyperlipidemia: Continue with the atorvastatin   Disposition: DC to home Needs script fr NTG.  Length of Stay: 1  Vesta MixerPhilip J. Shataya Martinez, Troy HagemanJr., MD, [facility] 04/05/2014, 8:16 AM Office [phone] Pager [phone]

## 2014-04-05 NOTE — Progress Notes (Signed)
CARDIAC REHAB PHASE I   PRE:  Rate/Rhythm: 56 SB  BP:  Sitting: 122/67      SaO2: 97 RA  MODE:  Ambulation: 950 ft   POST:  Rate/Rhythm: 64 SR  BP:  Sitting: 122/59     SaO2: 99 RA  [id number] Patient ambulated in hall independently. Steady gait noted. Patient denied complaints of CP or SOB. Post walk patient to chair with call bell in reach. Patient and wife educated on discharge instructions including nitro use, exercise guidelines, antiplatelet therapy, and restrictions. Patient refused phase 2 cardiac rehab. Stated he has completed the program before and is comfortable exercising on his own. Patient to be discharged later this am.  Maude Lericheayne, Tylah Mancillas EnglishRN, BSN 04/05/2014 8:55 AM

## 2014-04-08 LAB — POCT ACTIVATED CLOTTING TIME: ACTIVATED CLOTTING TIME: 225 s

## 2014-04-09 ENCOUNTER — Telehealth: Payer: Self-pay | Admitting: Interventional Cardiology

## 2014-04-09 NOTE — Telephone Encounter (Signed)
**Note De-Identified Rider Troy Martinez Obfuscation** The pt is aware that Dr Eldridge DaceVaranasi and his nurse, Amy, are not in the office today. He requests a call from Amy to discuss his f/u date. Message has been forwarded to Amy.

## 2014-04-09 NOTE — Telephone Encounter (Signed)
New problem   Pt had stent placement and was told to come in to see Dr Eldridge DaceVaranasi 04/14/14 but there are not appt avail. Please advise pt of appt.

## 2014-04-10 NOTE — Telephone Encounter (Signed)
Pt aware of appt.

## 2014-04-10 NOTE — Telephone Encounter (Signed)
Lmtrc, Appt made for 04/17/14 at 2:45 pm.

## 2014-04-14 ENCOUNTER — Ambulatory Visit: Payer: 59 | Admitting: Interventional Cardiology

## 2014-04-17 ENCOUNTER — Encounter: Payer: Self-pay | Admitting: Interventional Cardiology

## 2014-04-17 ENCOUNTER — Ambulatory Visit (INDEPENDENT_AMBULATORY_CARE_PROVIDER_SITE_OTHER): Payer: 59 | Admitting: Interventional Cardiology

## 2014-04-17 VITALS — BP 110/70 | HR 64 | Ht 68.0 in | Wt 209.8 lb

## 2014-04-17 DIAGNOSIS — I2510 Atherosclerotic heart disease of native coronary artery without angina pectoris: Secondary | ICD-10-CM

## 2014-04-17 DIAGNOSIS — E669 Obesity, unspecified: Secondary | ICD-10-CM

## 2014-04-17 DIAGNOSIS — E782 Mixed hyperlipidemia: Secondary | ICD-10-CM

## 2014-04-17 DIAGNOSIS — E119 Type 2 diabetes mellitus without complications: Secondary | ICD-10-CM

## 2014-04-17 NOTE — Progress Notes (Signed)
Patient ID: Troy Martinez Troy Martinez, male   DOB: 18-Feb-1961, 53 y.o.   MRN: [id number] Patient ID: Troy Martinez Curnutt, male   DOB: 18-Feb-1961, 53 y.o.   MRN: [id number]    [address] [place], [place]  [id number] Phone: [phone]) [phone] Fax:  [phone]) [phone]  Date:  04/17/2014   ID:  Troy Martinez Pryer, DOB 18-Feb-1961, MRN [id number]  PCP:  Desmond DikeAMERON,JOHN, MD      History of Present Illness: Troy Martinez Troy Martinez is a 53 y.o. male who has had CAD. He has had stents to teh circumflex and RCA in the past.  He had a stent to the OM in July 2015.   Angina in the past has been pain in the throat and DOE with exertion. Felt tired and minimal throat pain in hot weather but it resolved quickly without NTG.  He had a wrist hematoma and now the bruising is resolving.  He had mild anemia.  FOBT negative CAD/ASCVD:  Denies : Dizziness.  Dyspnea on exertion.  Leg edema.  Orthopnea.  Palpitations.  Paroxysmal nocturnal dyspnea.  Syncope.   Fatigue better   Wt Readings from Last 3 Encounters:  04/17/14 209 lb 12.8 oz (95.165 kg)  04/05/14 213 lb 6.5 oz (96.8 kg)  04/05/14 213 lb 6.5 oz (96.8 kg)     Past Medical History  Diagnosis Date  . GERD (gastroesophageal reflux disease)   . Coronary artery disease     a. DES to LCx (2008)  b. DES to RCA (2012) c. Overlapping DESx2 to large ramus (03/2014)  . High cholesterol   . Hypertension   . Type II diabetes mellitus   . Arthritis   . Obesity     Current Outpatient Prescriptions  Medication Sig Dispense Refill  . acetaminophen (TYLENOL) 325 MG tablet Take 650 mg by mouth every 6 (six) hours as needed for mild pain, fever or headache.       Marland Kitchen. aspirin 81 MG tablet Take 162 mg by mouth daily.      Marland Kitchen. atorvastatin (LIPITOR) 80 MG tablet Take 80 mg by mouth daily.      Marland Kitchen. esomeprazole (NEXIUM) 20 MG capsule Take 20 mg by mouth daily at 12 noon.      . fenofibrate 160 MG tablet Take 160 mg by mouth daily.      Marland Kitchen. glimepiride (AMARYL) 1 MG tablet Take 0.5 mg by  mouth daily with breakfast.       . lisinopril (PRINIVIL,ZESTRIL) 5 MG tablet Take 5 mg by mouth daily.      . nebivolol (BYSTOLIC) 5 MG tablet Take 5 mg by mouth daily.      . nitroGLYCERIN (NITROSTAT) 0.4 MG SL tablet Place 1 tablet (0.4 mg total) under the tongue every 5 (five) minutes as needed for chest pain.  25 tablet  12  . ONE TOUCH ULTRA TEST test strip 1 each.       . prasugrel (EFFIENT) 10 MG TABS tablet Take 1 tablet (10 mg total) by mouth daily.  30 tablet  12  . sitaGLIPtin-metformin (JANUMET) 50-1000 MG per tablet Take 1 tablet by mouth 2 (two) times daily with a meal.  60 tablet  11   No current facility-administered medications for this visit.    Allergies:   No Known Allergies  Social History:  The patient  reports that he quit smoking about 15 years ago. His smoking use included Cigarettes. He has a 69 pack-year smoking history. He has never used  smokeless tobacco. He reports that he drinks about 8.4 ounces of alcohol per week. He reports that he uses illicit drugs (Marijuana).   Family History:  The patient's family history includes Heart disease in his father.   ROS:  Please see the history of present illness.  No nausea, vomiting.  No fevers, chills.  No focal weakness.  No dysuria.   All other systems reviewed and negative.   PHYSICAL EXAM: VS:  BP 110/70  Pulse 64  Ht 5\' 8"  (1.727 m)  Wt 209 lb 12.8 oz (95.165 kg)  BMI 31.91 kg/m2 Well nourished, well developed, in no acute distress HEENT: normal Neck: no JVD, no carotid bruits Cardiac:  normal S1, S2; RRR;  Lungs:  clear to auscultation bilaterally, no wheezing, rhonchi or rales Abd: soft, nontender, no hepatomegaly ZOX:WRUEA leg edema-chronic; right wrist bruised Skin: warm and dry Neuro:   no focal abnormalities noted  EKG:  NSR, rSR', mo ST segment changes     ASSESSMENT AND PLAN:  Coronary atherosclerosis of native coronary artery   Aspirin Tablet Delayed Release, 81 MG, 1 tablet, Orally, Once a  day COntinue Effient Tablet, 10 MG, 1 tablet, Orally, once a day, 90 days,  Notes: No typical angina. Changed Brilinta to Effient and SHOB better. Plavix resistant in the past. Encouraged him to try to modify risk factors with diet and exercise.  No chest pain. THroat pain as noted above. Continue to follow.    2. Mixed hyperlipidemia  Refill Fenofibrate Tablet, 160 MG, 1 tablet, Orally, every evening, 90 days, 90, Refills 3 Continue Atorvastatin Calcium Tablet, 80 MG, 1 tablet, Orally, Once a day Notes: Controlled. Lipids checked by his primary care Dr.    3. Essential hypertension, benign  Continue Lisinopril Tablet, 10 MG, 1 tablet, Orally, Once a day Refill Bystolic Tablet, 5 MG, 1 tablet Once a day, orally, Once a day, 90, Refills 3 Notes: Controlled. He has some sx of low BP. He will get dizzy and fatigued, and will feel better with eating salt and ice. He does not check his BP at home. Try to stay well hydrated. Check readings at home. COuld consider stopping bystolic if readings are low.  Target blood pressure was 100  To 130 systolic   4. Obesity, unspecified  Notes: Continue to try to lose weight with diet and exercise. He has lost some weight. See below re nutritionist referral.    Preventive Medicine  Adult topics discussed:  Diet: healthy diet, low calorie, low fat.  Exercise: 5 days a week, at least 30 minutes of aerobic exercise. Will refer to nutritionist per his request.  We spoke about  The Mediterranean diet.  I would not recommend Atkins for him.       Signed, Fredric Mare, MD, [facility] 04/17/2014 3:00 PM

## 2014-04-17 NOTE — Patient Instructions (Addendum)
[facility] have [facility]   Your physician recommends that you continue on your current medications as directed. Please refer to the Current Medication list given to you today.  Your physician recommends that you schedule a follow-up appointment in: 4 months with Dr. Eldridge DaceVaranasi.

## 2014-04-22 ENCOUNTER — Other Ambulatory Visit: Payer: Self-pay

## 2014-04-22 MED ORDER — ATORVASTATIN CALCIUM 80 MG PO TABS: 80.0000 mg | ORAL_TABLET | Freq: Every day | ORAL | Status: DC

## 2014-05-20 ENCOUNTER — Other Ambulatory Visit: Payer: Self-pay | Admitting: *Deleted

## 2014-05-20 MED ORDER — FENOFIBRATE 160 MG PO TABS: 160.0000 mg | ORAL_TABLET | Freq: Every day | ORAL | Status: DC

## 2014-05-20 MED ORDER — NEBIVOLOL HCL 5 MG PO TABS: 5.0000 mg | ORAL_TABLET | Freq: Every day | ORAL | Status: DC

## 2014-06-04 ENCOUNTER — Encounter: Payer: 59 | Attending: Interventional Cardiology | Admitting: *Deleted

## 2014-06-04 ENCOUNTER — Encounter: Payer: Self-pay | Admitting: *Deleted

## 2014-06-04 VITALS — Ht 68.0 in | Wt 209.1 lb

## 2014-06-04 DIAGNOSIS — E119 Type 2 diabetes mellitus without complications: Secondary | ICD-10-CM | POA: Diagnosis present

## 2014-06-04 DIAGNOSIS — Z713 Dietary counseling and surveillance: Secondary | ICD-10-CM | POA: Insufficient documentation

## 2014-06-04 DIAGNOSIS — Z6831 Body mass index (BMI) 31.0-31.9, adult: Secondary | ICD-10-CM | POA: Insufficient documentation

## 2014-06-04 DIAGNOSIS — E669 Obesity, unspecified: Secondary | ICD-10-CM | POA: Insufficient documentation

## 2014-06-04 DIAGNOSIS — I2510 Atherosclerotic heart disease of native coronary artery without angina pectoris: Secondary | ICD-10-CM | POA: Diagnosis not present

## 2014-06-04 NOTE — Progress Notes (Signed)
Medical Nutrition Therapy:  Appt start time: 0830 end time:  0930.  Assessment:  Patient with type 2 diabetes and CAD, with history of stents, most recent in July 2015. He reports an A1c of 7.1% around July or August, which is a downward trend. He doesn't check his blood glucose daily, but typical fasting BG are under 110 with some highs in the 120-130s. He is currently on glimepiride and Janumet. Patient and wife have received education on carb counting and heart healthy diets previously. His main concern today is weight loss. He states that he generally takes a drastic approach to weight loss, and has given up on a plan when it does not seem effective. We discussed the importance of a more gradual approach to weight loss to promote lifestyle changes. Patient and wife report struggling to combine diabetes and CAD diet recommendations.  Physical activity is currently low due to sedentary job and 2 hours of commuting daily. He did recently get a FitBit and has bought a [employer] with plans to exercise in the mornings before work. He averages 3,000 steps on a work day, but will walk up to 15,000 steps on the weekends.   MEDICATIONS: Glimepiride, Janumet, for others, see list   DIETARY INTAKE:   Usual eating pattern includes 3 meals and 1-2 snacks per day.  24-hr recall:  B ( AM): Cereal (Cheerios/corn flakes, 2 tsp sugar, bananas, skim milk) OR Peanut butter toast OR scrambled eggs with cheese OR Carnation Instant Breakfast, coffee (Splenda, light creamer) Snk ( AM): None  L ( PM): Leftovers OR salad OR 1.5 sandwich (banana and mayo, pimento cheese), Coffee Snk ( PM): None usually, sandwich/hamburger D ( PM): [place] steak with mashed potatoes OR pasta with meatballs, marinara sauce OR eats out 3 days a week: Chinese/Mexican/Fish, sweet tea/diet Coke/water Snk ( PM): Popcorn with extra butter added  Beverages: Water, coffee, diet Coke, 8 oz sweet tea (reduced sugar)  Usual physical activity:  Active around house, 3,000 steps daily, [employer] at home mornings  Estimated energy needs: 1800 calories 203 g carbohydrates 113 g protein 60 g fat  Progress Towards Goal(s):  In progress.   Nutritional Diagnosis:  NB-1.1 Food and nutrition-related knowledge deficit As related to healthy eating for weight loss.  As evidenced by patient report.    Intervention:  Nutrition counseling. We discussed strategies for weight loss, including balancing nutrients (carbs, protein, fat), portion control, healthy snacks, and exercise. We discussed basic carb counting, including foods with carbs, label reading, portion size, and meal planning.   Goals:  1. 1-2 pounds weight loss per week, Goal weight: 190 pounds 2. 2-3 carb portions per meal, 1 portion per snack 3. Use plate method for healthy meal planning.  4. Limit snacks to no more than 300 calories per day. Choose healthy, lower calorie options (light buttered popcorn, raw vegetables with light ranch, 1/2 sandwich).  5. Increase average daily steps to 5,000 per day (walking for up to 5 minutes every 1-2 hours, exercise at home 20-30 minutes in the mornings 2-3 days a week, longer activity on weekends).  6. Monitor BG once daily, alternating between fasting and 2 hours after meals.   Handouts given during visit include:  1500 calorie 5 day meal plan  Weight loss tips  Meal plan card  Monitoring/Evaluation:  Dietary intake, exercise, blood glucose, and body weight prn.

## 2014-06-18 ENCOUNTER — Telehealth: Payer: Self-pay | Admitting: Interventional Cardiology

## 2014-06-18 NOTE — Telephone Encounter (Signed)
Can pt take celebrex? Pt is on Effient.

## 2014-06-18 NOTE — Telephone Encounter (Signed)
New message    Patient calling was advise by another physician  Today to take celebrex due to back pain.  Please advise should he take this or not.

## 2014-06-19 NOTE — Telephone Encounter (Signed)
OK to take for a few days but it should not be a regular medicine that he takes longterm.

## 2014-06-20 NOTE — Telephone Encounter (Signed)
Pt.notified

## 2014-06-20 NOTE — Telephone Encounter (Signed)
Spoke with pt and he was told to take medications for 3 weeks. Is this okay?

## 2014-06-20 NOTE — Telephone Encounter (Signed)
Ok to take.  Watch for any signs of bleeding. Hold aspirin while taking the celebrex.

## 2014-07-03 ENCOUNTER — Telehealth: Payer: Self-pay | Admitting: Interventional Cardiology

## 2014-07-03 NOTE — Telephone Encounter (Signed)
New message      Pt needs an epidural steroid injection because of a ruptured disk.  Need to stop effident 7 days prior.  Will this be ok?

## 2014-07-04 NOTE — Telephone Encounter (Signed)
Pt asking if OK to hold Effient for 7 days prior to epidural spinal injection.  Pt advised I will forward to Dr Eldridge DaceVaranasi for review.

## 2014-07-04 NOTE — Telephone Encounter (Signed)
F/u    Pt calling concerning previous message. Please advise.

## 2014-07-04 NOTE — Telephone Encounter (Signed)
LM for pt to call back.

## 2014-07-07 ENCOUNTER — Telehealth: Payer: Self-pay | Admitting: Interventional Cardiology

## 2014-07-07 NOTE — Telephone Encounter (Signed)
See below

## 2014-07-07 NOTE — Telephone Encounter (Signed)
No, he cannot hold Effient at this time.  He just had a stent in July 2105 so he would have to wait until July 2016.  If it becomes an emergency, we could discuss holding Effient 6 months post stent to allow the procedure.

## 2014-07-07 NOTE — Telephone Encounter (Signed)
I doubt injection can be given with the patient on Effient.  WOuld have to wait, minimum 6 months, but ideally 12 months from the time of stent placement.

## 2014-07-07 NOTE — Telephone Encounter (Signed)
New message           Pt wife calling wanting to know if pt can be off of effient for a week before procedure on pt back

## 2014-07-07 NOTE — Telephone Encounter (Signed)
Pt advised,verbalized understanding. 

## 2014-07-07 NOTE — Telephone Encounter (Signed)
Called and informed patient of Varanasi's instructions. Patient verbalized understanding.

## 2014-07-07 NOTE — Telephone Encounter (Signed)
Spoke with patient.  Informed patient that this medication is usually not stopped for 1 yr post PCI. (PCI in July) Explained two options are to postpone injection or have injection while on Effient - but would get recommendation from physician.  Told him that I would still forward this for doctor to address but that it may be next week before answer, as Dr. Eldridge DaceVaranasi is not in the office this week. Patient verbalized understanding.

## 2014-08-12 ENCOUNTER — Ambulatory Visit (INDEPENDENT_AMBULATORY_CARE_PROVIDER_SITE_OTHER): Payer: 59 | Admitting: Interventional Cardiology

## 2014-08-12 ENCOUNTER — Encounter: Payer: Self-pay | Admitting: Interventional Cardiology

## 2014-08-12 VITALS — BP 122/88 | HR 62 | Ht 68.0 in | Wt 213.0 lb

## 2014-08-12 DIAGNOSIS — Z01810 Encounter for preprocedural cardiovascular examination: Secondary | ICD-10-CM

## 2014-08-12 DIAGNOSIS — E669 Obesity, unspecified: Secondary | ICD-10-CM

## 2014-08-12 DIAGNOSIS — I10 Essential (primary) hypertension: Secondary | ICD-10-CM

## 2014-08-12 DIAGNOSIS — I2510 Atherosclerotic heart disease of native coronary artery without angina pectoris: Secondary | ICD-10-CM

## 2014-08-12 NOTE — Patient Instructions (Signed)
Your physician recommends that you continue on your current medications as directed. Please refer to the Current Medication list given to you today.  Your physician wants you to follow-up in: 6 months with Dr. Varanasi.  You will receive a reminder letter in the mail two months in advance. If you don't receive a letter, please call our office to schedule the follow-up appointment.  

## 2014-08-12 NOTE — Progress Notes (Signed)
Patient ID: Troy Martinez, male   DOB: [birth year], 53 y.o.   MRN: [id number] Patient ID: Troy Martinez, male   DOB: [birth year], 53 y.o.   MRN: [id number] Patient ID: Troy Martinez, male   DOB: [birth year], 53 y.o.   MRN: [id number]    [address] [place], [place]  [address] Phone: [phone]) [phone] Fax:  [phone]([phone]  Date:  08/12/2014   ID:  Troy Martinez, DOB [birth year], MRN [id number]  PCP:  Desmond DikeAMERON,JOHN, MD      History of Present Illness: Troy Martinez is a 53 y.o. male who has had CAD. He has had stents to teh circumflex and RCA in the past.  He had a stent to the OM in July 2015.   Angina in the past has been pain in the throat and DOE with exertion. Felt tired and minimal throat pain in hot weather.  It resolved quickly without NTG.  No further sx since the weather has cooled down.  No NTG use recently.     CAD/ASCVD:  Denies : Dizziness.  Dyspnea on exertion.  Leg edema.  Orthopnea.  Palpitations.  Paroxysmal nocturnal dyspnea.  Syncope.   Fatigue better.  He has a ruptured disk in his back and needs epidural shots, but this has been postponed due to Effient.  He is gettig a second opinion.  He is trying some pain management.  Celebrex was inefective.  Hurt back while lifting something heavy.   Wt Readings from Last 3 Encounters:  08/12/14 213 lb (96.616 kg)  06/04/14 209 lb 1.6 oz (94.847 kg)  04/17/14 209 lb 12.8 oz (95.165 kg)     Past Medical History  Diagnosis Date  . GERD (gastroesophageal reflux disease)   . Coronary artery disease     a. DES to LCx (2008)  b. DES to RCA (2012) c. Overlapping DESx2 to large ramus (03/2014)  . High cholesterol   . Hypertension   . Type II diabetes mellitus   . Arthritis   . Obesity     Current Outpatient Prescriptions  Medication Sig Dispense Refill  . aspirin 81 MG tablet Take 162 mg by mouth daily.    Marland Kitchen. atorvastatin (LIPITOR) 80 MG tablet Take 1 tablet (80 mg total) by mouth daily. 30 tablet 3  .  esomeprazole (NEXIUM) 20 MG capsule Take 20 mg by mouth daily at 12 noon.    . fenofibrate 160 MG tablet Take 1 tablet (160 mg total) by mouth daily. 90 tablet 0  . glimepiride (AMARYL) 1 MG tablet Take 0.5 mg by mouth daily with breakfast.     . lisinopril (PRINIVIL,ZESTRIL) 5 MG tablet Take 5 mg by mouth daily.    . nebivolol (BYSTOLIC) 5 MG tablet Take 1 tablet (5 mg total) by mouth daily. 90 tablet 0  . ONE TOUCH ULTRA TEST test strip 1 each.     Marland Kitchen. oxyCODONE-acetaminophen (PERCOCET/ROXICET) 5-325 MG per tablet   0  . prasugrel (EFFIENT) 10 MG TABS tablet Take 1 tablet (10 mg total) by mouth daily. 30 tablet 12  . sitaGLIPtin-metformin (JANUMET) 50-1000 MG per tablet Take 1 tablet by mouth 2 (two) times daily with a meal. 60 tablet 11  . acetaminophen (TYLENOL) 325 MG tablet Take 650 mg by mouth every 6 (six) hours as needed for mild pain, fever or headache.     . nitroGLYCERIN (NITROSTAT) 0.4 MG SL tablet Place 1 tablet (0.4 mg total) under the tongue every 5 (five) minutes  as needed for chest pain. (Patient not taking: Reported on 08/12/2014) 25 tablet 12   No current facility-administered medications for this visit.    Allergies:   No Known Allergies  Social History:  The patient  reports that he quit smoking about 15 years ago. His smoking use included Cigarettes. He has a 69 pack-year smoking history. He has never used smokeless tobacco. He reports that he drinks about 8.4 oz of alcohol per week. He reports that he uses illicit drugs (Marijuana).   Family History:  The patient's family history includes Heart disease in his father.   ROS:  Please see the history of present illness.  No nausea, vomiting.  No fevers, chills.  No focal weakness.  No dysuria.   All other systems reviewed and negative.   PHYSICAL EXAM: VS:  BP 122/88 mmHg  Pulse 62  Ht 5\' 8"  (1.727 m)  Wt 213 lb (96.616 kg)  BMI 32.39 kg/m2 Well nourished, well developed, in no acute distress HEENT: normal Neck: no  JVD, no carotid bruits Cardiac:  normal S1, S2; RRR;  Lungs:  clear to auscultation bilaterally, no wheezing, rhonchi or rales Abd: soft, nontender, no hepatomegaly ZOX:WRUEAExt:right leg edema-chronic; right wrist bruised Skin: warm and dry Neuro:   no focal abnormalities noted Psych: normal affect      ASSESSMENT AND PLAN:  Coronary atherosclerosis of native coronary artery   Aspirin Tablet Delayed Release, 81 MG, 1 tablet, Orally, Once a day COntinue Effient Tablet, 10 MG, 1 tablet, Orally, once a day, 90 days,  Notes: No typical angina. Changed Brilinta to Effient and SHOB better. Plavix resistant in the past. Encouraged him to try to modify risk factors with diet and exercise.  No chest pain. THroat pain as noted above in hot weather. Continue to follow.    2. Mixed hyperlipidemia  Refill Fenofibrate Tablet, 160 MG, 1 tablet, Orally, every evening, 90 days, 90, Refills 3 Continue Atorvastatin Calcium Tablet, 80 MG, 1 tablet, Orally, Once a day Notes: Controlled. Lipids checked by his primary care Dr.    3. Essential hypertension, benign  Continue Lisinopril Tablet, 10 MG, 1 tablet, Orally, Once a day Refill Bystolic Tablet, 5 MG, 1 tablet Once a day, orally, Once a day, 90, Refills 3 Notes: Controlled. He has less sx of low BP. Less dizzy and fatigued feeling, and will feel better with eating salt and ice. He does not check his BP at home. Try to stay well hydrated- not drinking a lot of water recently. Check readings at home. Could consider stopping bystolic if readings are low.  Target blood pressure was 100  To 130 systolic.    4. Obesity, unspecified  Notes: Continue to try to lose weight with diet and exercise. He has lost some weight. Lost weight after nutritionist referral.  Gaining some weight since the back problem.    5. Preoperative eval.: Tolerating back pain for now.  No surgery planned.  Will plan for epidural shots in Jan if they are still needed for herniated disk.  Await  recs of Dr. Noel Geroldohen.  Preventive Medicine  Adult topics discussed:  Diet: healthy diet, low calorie, low fat.  Exercise: 5 days a week, at least 30 minutes of aerobic exercise- but now limited due to back problem. Good results after seeing nutritionist.        Signed, Fredric MareJay S. Tel Hevia, MD, [facility] 08/12/2014 9:19 AM

## 2014-08-28 ENCOUNTER — Encounter (HOSPITAL_COMMUNITY): Payer: Self-pay | Admitting: Interventional Cardiology

## 2014-08-29 ENCOUNTER — Other Ambulatory Visit: Payer: Self-pay | Admitting: Interventional Cardiology

## 2014-08-30 ENCOUNTER — Other Ambulatory Visit: Payer: Self-pay | Admitting: Interventional Cardiology

## 2015-01-10 ENCOUNTER — Other Ambulatory Visit: Payer: Self-pay | Admitting: Interventional Cardiology

## 2015-01-19 ENCOUNTER — Other Ambulatory Visit: Payer: Self-pay | Admitting: Interventional Cardiology

## 2015-01-22 ENCOUNTER — Telehealth: Payer: Self-pay | Admitting: Interventional Cardiology

## 2015-01-22 NOTE — Telephone Encounter (Signed)
New message      Can pt donate blood to the [facility] being on effient?  Want to go tomorrow.

## 2015-01-22 NOTE — Telephone Encounter (Signed)
Reviewed information on [facility] Website--medication and medical condition restrictions/eligibility requirements with patient. There appears to be no restriction to donate whole blood on effient. Advised they will discuss health history with him at donation center to determine eligibility for certain. Pt verbalizes understanding and appreciative for the call.

## 2015-03-25 ENCOUNTER — Other Ambulatory Visit: Payer: Self-pay | Admitting: Interventional Cardiology

## 2015-03-26 ENCOUNTER — Other Ambulatory Visit: Payer: Self-pay

## 2015-03-26 MED ORDER — NEBIVOLOL HCL 5 MG PO TABS: 5.0000 mg | ORAL_TABLET | Freq: Every day | ORAL | Status: DC

## 2015-03-26 MED ORDER — FENOFIBRATE 160 MG PO TABS: 160.0000 mg | ORAL_TABLET | Freq: Every day | ORAL | Status: DC

## 2015-04-14 ENCOUNTER — Other Ambulatory Visit: Payer: Self-pay | Admitting: Interventional Cardiology

## 2015-05-08 ENCOUNTER — Encounter: Payer: Self-pay | Admitting: Interventional Cardiology

## 2015-05-08 ENCOUNTER — Ambulatory Visit (INDEPENDENT_AMBULATORY_CARE_PROVIDER_SITE_OTHER): Payer: 59 | Admitting: Interventional Cardiology

## 2015-05-08 VITALS — BP 96/60 | HR 61 | Ht 68.0 in | Wt 205.4 lb

## 2015-05-08 DIAGNOSIS — I2510 Atherosclerotic heart disease of native coronary artery without angina pectoris: Secondary | ICD-10-CM

## 2015-05-08 DIAGNOSIS — I10 Essential (primary) hypertension: Secondary | ICD-10-CM | POA: Diagnosis not present

## 2015-05-08 DIAGNOSIS — E782 Mixed hyperlipidemia: Secondary | ICD-10-CM | POA: Diagnosis not present

## 2015-05-08 NOTE — Patient Instructions (Signed)
**Note De-identified Troy Martinez Obfuscation** Medication Instructions:  Same-no changes  Labwork: None  Testing/Procedures: None  Follow-Up: Your physician wants you to follow-up in: 6 months. You will receive a reminder letter in the mail two months in advance. If you don't receive a letter, please call our office to schedule the follow-up appointment.      

## 2015-05-08 NOTE — Progress Notes (Signed)
Patient ID: Troy Martinez, male   DOB: April 27, 1961, 54 y.o.   MRN: [id number]     Cardiology Office Note   Date:  05/08/2015   ID:  Troy Martinez, DOB February 18, 1961, MRN [id number]  PCP:  Carlus Pavlov, MD    No chief complaint on file. CAD   Wt Readings from Last 3 Encounters:  05/08/15 205 lb 6.4 oz (93.169 kg)  08/12/14 213 lb (96.616 kg)  06/04/14 209 lb 1.6 oz (94.847 kg)       History of Present Illness: Troy Martinez is a 54 y.o. male  who has had CAD. He has had stents to teh circumflex and RCA in the past. He had a stent to the OM in July 2015.  Angina in the past has been pain in the throat and DOE with exertion.   Felt tired and w/u showed B12 deficiency, iron deficiency and Vit C defic per his report.  He is supplementing these. He has had improvement in his sx.  He has not had any throat tightness.    He had a ruptured disc in 8/15 but treat,ment was delayed due to the DAPT.  He recovered with rehab exercises. He has not required any surgery. He is still having some back pain will be evaluated again seen.     Past Medical History  Diagnosis Date  . GERD (gastroesophageal reflux disease)   . Coronary artery disease     a. DES to LCx (2008)  b. DES to RCA (2012) c. Overlapping DESx2 to large ramus (03/2014)  . High cholesterol   . Hypertension   . Type II diabetes mellitus   . Arthritis   . Obesity     Past Surgical History  Procedure Laterality Date  . Cardiac catheterization  06/2007   . Coronary angioplasty with stent placement  06/2007 11/2010    DES in distal circumflex (Endeavor) w normal LV function 11/2010 DES to RCA-Promus  . Coronary angioplasty with stent placement  04/04/2014    "2; got a total of 4 now"  . Orif proximal tibial plateau fracture Right 2003  . Leg surgery Right     "cut scar tissue out"  . Tibia hardware removal Right ?2004  . Decompression fasciotomy leg Right 2003  . Back surgery    . Anterior cervical decomp/discectomy  fusion  05/2005  . Left heart catheterization with coronary angiogram N/A 04/04/2014    Procedure: LEFT HEART CATHETERIZATION WITH CORONARY ANGIOGRAM;  Surgeon: Jettie Booze, MD;  Location: [facility] CATH LAB;  Service: Cardiovascular;  Laterality: N/A;  . Percutaneous stent intervention  04/04/2014    Procedure: PERCUTANEOUS STENT INTERVENTION;  Surgeon: Jettie Booze, MD;  Location: [facility] CATH LAB;  Service: Cardiovascular;;  DES x 2 Ramus 2.5x8, 2.5x32      Current Outpatient Prescriptions  Medication Sig Dispense Refill  . acetaminophen (TYLENOL) 325 MG tablet Take 650 mg by mouth every 6 (six) hours as needed for mild pain, fever or headache.     . Ascorbic Acid (VITAMIN C) 500 MG CHEW Chew 250 mg by mouth daily.    Marland Kitchen aspirin 81 MG tablet Take 162 mg by mouth daily.    Marland Kitchen atorvastatin (LIPITOR) 80 MG tablet TAKE 1 TABLET BY MOUTH EVERY DAY 30 tablet 3  . Cyanocobalamin (B-12 COMPLIANCE INJECTION) 1000 MCG/ML KIT Inject as directed once a week.    Marland Kitchen EFFIENT 10 MG TABS tablet TAKE 1 TABLET EVERY DAY 30 tablet 11  . esomeprazole (NEXIUM)  20 MG capsule Take 20 mg by mouth daily at 12 noon.    Marland Kitchen Fe-Succ Ac-C-Thre Ac-B12-FA (FERREX 150 FORTE PLUS) 50-100 MG CAPS Take by mouth as directed    . fenofibrate 160 MG tablet Take 1 tablet (160 mg total) by mouth daily. 90 tablet 0  . gabapentin (NEURONTIN) 300 MG capsule Take 300 mg by mouth 3 (three) times daily.  2  . glimepiride (AMARYL) 1 MG tablet Take 0.5 mg by mouth daily with breakfast.     . JANUMET XR 50-1000 MG TB24 Take 1 tablet by mouth 2 (two) times daily.  3  . lisinopril (PRINIVIL,ZESTRIL) 10 MG tablet Take 5 mg by mouth daily.  12  . lisinopril (PRINIVIL,ZESTRIL) 5 MG tablet Take 5 mg by mouth daily.    . nebivolol (BYSTOLIC) 5 MG tablet Take 1 tablet (5 mg total) by mouth daily. 90 tablet 0  . nitroGLYCERIN (NITROSTAT) 0.4 MG SL tablet Place 1 tablet (0.4 mg total) under the tongue every 5 (five) minutes as needed for chest pain.  25 tablet 12  . ONE TOUCH ULTRA TEST test strip 1 each.     . sitaGLIPtin-metformin (JANUMET) 50-1000 MG per tablet Take 1 tablet by mouth 2 (two) times daily with a meal. 60 tablet 11   No current facility-administered medications for this visit.    Allergies:   Review of patient's allergies indicates no known allergies.    Social History:  The patient  reports that he quit smoking about 16 years ago. His smoking use included Cigarettes. He has a 69 pack-year smoking history. He has never used smokeless tobacco. He reports that he drinks about 8.4 oz of alcohol per week. He reports that he uses illicit drugs (Marijuana).   Family History:  The patient's family history includes Heart disease in his father.    ROS:  Please see the history of present illness.   Otherwise, review of systems are positive for fatigue.   All other systems are reviewed and negative.    PHYSICAL EXAM: VS:  BP 96/60 mmHg  Pulse 61  Ht '5\' 8"'  (1.727 m)  Wt 205 lb 6.4 oz (93.169 kg)  BMI 31.24 kg/m2 , BMI Body mass index is 31.24 kg/(m^2). GEN: Well nourished, well developed, in no acute distress HEENT: normal Neck: no JVD, carotid bruits, or masses Cardiac: RRR; no murmurs, rubs, or gallops,no edema  Respiratory:  clear to auscultation bilaterally, normal work of breathing GI: soft, nontender, nondistended, + BS MS: no deformity or atrophy Skin: warm and dry, no rash Neuro:  Strength and sensation are intact Psych: euthymic mood, full affect   EKG:   The ekg ordered today demonstrates normal   Recent Labs: No results found for requested labs within last 365 days.   Lipid Panel No results found for: CHOL, TRIG, HDL, CHOLHDL, VLDL, LDLCALC, LDLDIRECT   Other studies Reviewed: Additional studies/ records that were reviewed today with results demonstrating: cath report 2015.   ASSESSMENT AND PLAN:  1. CAD: He had some symptoms which were concerning him for angina. They improved with B-12  supplementation. There is no throat tightness involved which he has had before all of his prior revascularizations. Given that the symptoms are not like to previous, we'll hold off on any type of angiogram. Continue B-12 supplementation with his primary care doctor. He'll let us know if his symptoms get worse. 2. Hyperlipidemia: Continue atorvastatin. LDL target less than 100. 3. Hypertension: Continue current medicines. Blood pressure well controlled.  Current medicines are reviewed at length with the patient today.  The patient concerns regarding his medicines were addressed.  The following changes have been made:  No change  Labs/ tests ordered today include:  No orders of the defined types were placed in this encounter.    Recommend 150 minutes/week of aerobic exercise Low fat, low carb, high fiber diet recommended  Disposition:   FU in 6 months   Teresita Madura., MD  05/08/2015 5:05 PM    [facility] [place], [place], [place]  [address] Phone: [phone]; Fax: [phone]

## 2015-05-19 ENCOUNTER — Other Ambulatory Visit: Payer: Self-pay | Admitting: Orthopaedic Surgery

## 2015-05-19 DIAGNOSIS — M4727 Other spondylosis with radiculopathy, lumbosacral region: Secondary | ICD-10-CM

## 2015-06-01 ENCOUNTER — Other Ambulatory Visit: Payer: 59

## 2015-06-01 ENCOUNTER — Other Ambulatory Visit: Payer: Self-pay | Admitting: Interventional Cardiology

## 2015-06-05 ENCOUNTER — Ambulatory Visit
Admission: RE | Admit: 2015-06-05 | Discharge: 2015-06-05 | Disposition: A | Discharge status: Home / Self Care | Payer: 59 | Source: Ambulatory Visit | Attending: Orthopaedic Surgery | Admitting: Orthopaedic Surgery

## 2015-06-05 DIAGNOSIS — M4727 Other spondylosis with radiculopathy, lumbosacral region: Secondary | ICD-10-CM

## 2015-06-27 ENCOUNTER — Other Ambulatory Visit: Payer: Self-pay | Admitting: Interventional Cardiology

## 2015-08-18 ENCOUNTER — Encounter: Payer: Self-pay | Admitting: Interventional Cardiology

## 2015-10-13 ENCOUNTER — Other Ambulatory Visit: Payer: Self-pay | Admitting: Interventional Cardiology

## 2015-12-01 ENCOUNTER — Ambulatory Visit (INDEPENDENT_AMBULATORY_CARE_PROVIDER_SITE_OTHER): Payer: 59 | Admitting: Interventional Cardiology

## 2015-12-01 ENCOUNTER — Encounter: Payer: Self-pay | Admitting: Interventional Cardiology

## 2015-12-01 VITALS — BP 100/60 | HR 68 | Ht 68.0 in | Wt 197.6 lb

## 2015-12-01 DIAGNOSIS — I2510 Atherosclerotic heart disease of native coronary artery without angina pectoris: Secondary | ICD-10-CM

## 2015-12-01 DIAGNOSIS — E782 Mixed hyperlipidemia: Secondary | ICD-10-CM | POA: Diagnosis not present

## 2015-12-01 DIAGNOSIS — I10 Essential (primary) hypertension: Secondary | ICD-10-CM | POA: Diagnosis not present

## 2015-12-01 DIAGNOSIS — E1159 Type 2 diabetes mellitus with other circulatory complications: Secondary | ICD-10-CM

## 2015-12-01 MED ORDER — NITROGLYCERIN 0.4 MG SL SUBL: 0.4000 mg | SUBLINGUAL_TABLET | SUBLINGUAL | Status: DC | PRN

## 2015-12-01 NOTE — Progress Notes (Signed)
Patient ID: Troy Martinez, male   DOB: 13-Dec-1960, 55 y.o.   MRN: [id number]     Cardiology Office Note   Date:  12/01/2015   ID:  Troy Martinez, DOB 1961-06-28, MRN [id number]  PCP:  Carlus Pavlov, MD    No chief complaint on file. CAD   Wt Readings from Last 3 Encounters:  12/01/15 197 lb 9.6 oz (89.631 kg)  05/08/15 205 lb 6.4 oz (93.169 kg)  08/12/14 213 lb (96.616 kg)       History of Present Illness: Troy Martinez is a 55 y.o. male  who has had CAD. He has had stents to the circumflex and RCA in the past. He had a stent to the OM in July 2015. The was the most recent PCI. Angina in the past has been pain in the throat and DOE with exertion.   In 2016, Felt tired and w/u showed B12 deficiency, iron deficiency and Vit C defic per his report.  He is supplementing these, previously with injection but now with oral. He has had improvement and is feeling much better.  He has not had any throat tightness.    He had a ruptured disc in 8/15 but treat,ment was delayed due to the DAPT.  He recovered with rehab exercises. He has not required any surgery. He is still having some back pain will be evaluated again.  Epidural is being considered.  Since the last visit, he has lost weight doing [employer].  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  As long as he does not bend and twist, then his back his ok.  Occasional brief tightness in the chest, when he takes a deep breath- not related to exertion.  Does not occur every day.  Tried NTG once but it dd not seem to change. Different from prior angina sx.    Past Medical History  Diagnosis Date  . GERD (gastroesophageal reflux disease)   . Coronary artery disease     a. DES to LCx (2008)  b. DES to RCA (2012) c. Overlapping DESx2 to large ramus (03/2014)  . High cholesterol   . Hypertension   . Type II diabetes mellitus ([place])   . Arthritis     . Obesity     Past Surgical History  Procedure Laterality Date  . Cardiac catheterization  06/2007   . Coronary angioplasty with stent placement  06/2007 11/2010    DES in distal circumflex (Endeavor) w normal LV function 11/2010 DES to RCA-Promus  . Coronary angioplasty with stent placement  04/04/2014    "2; got a total of 4 now"  . Orif proximal tibial plateau fracture Right 2003  . Leg surgery Right     "cut scar tissue out"  . Tibia hardware removal Right ?2004  . Decompression fasciotomy leg Right 2003  . Back surgery    . Anterior cervical decomp/discectomy fusion  05/2005  . Left heart catheterization with coronary angiogram N/A 04/04/2014    Procedure: LEFT HEART CATHETERIZATION WITH CORONARY ANGIOGRAM;  Surgeon: Jettie Booze, MD;  Location[facility] CATH LAB;  Service: Cardiovascular;  Laterality: N/A;  . Percutaneous stent intervention  04/04/2014    Procedure: PERCUTANEOUS STENT INTERVENTION;  Surgeon: Jettie Booze, MD;  Location: [facility] CATH LAB;  Service: Cardiovascular;;  DES x 2 Ramus 2.5x8, 2.5x32      Current Outpatient Prescriptions  Medication Sig Dispense Refill  . acetaminophen (TYLENOL) 325 MG tablet Take 650 mg by mouth every 6 (six) hours as needed for mild pain, fever or headache.     . Ascorbic Acid (VITAMIN C) 500 MG CHEW Chew 250 mg by mouth daily.    Marland Kitchen aspirin 81 MG tablet Take 81 mg by mouth daily.     Marland Kitchen atorvastatin (LIPITOR) 80 MG tablet TAKE 1 TABLET BY MOUTH EVERY DAY 30 tablet 3  . BYSTOLIC 5 MG tablet TAKE 1 TABLET EVERY DAY 90 tablet 0  . Cyanocobalamin (B-12 COMPLIANCE INJECTION) 1000 MCG/ML KIT Inject as directed once a week.    Marland Kitchen EFFIENT 10 MG TABS tablet TAKE 1 TABLET EVERY DAY 30 tablet 11  . esomeprazole (NEXIUM) 20 MG capsule Take 20 mg by mouth daily at 12 noon.    Marland Kitchen Fe-Succ Ac-C-Thre Ac-B12-FA (FERREX 150 FORTE PLUS) 50-100 MG CAPS Take by mouth as directed    . fenofibrate 160 MG tablet TAKE 1 TABLET EVERY DAY 90 tablet 0  .  gabapentin (NEURONTIN) 300 MG capsule Take 300 mg by mouth 3 (three) times daily.  2  . glimepiride (AMARYL) 1 MG tablet Take 0.5 mg by mouth daily with breakfast.     . JANUMET XR 50-1000 MG TB24 Take 1 tablet by mouth 2 (two) times daily.  3  . lisinopril (PRINIVIL,ZESTRIL) 10 MG tablet Take 5 mg by mouth daily.  12  . nitroGLYCERIN (NITROSTAT) 0.4 MG SL tablet Place 1 tablet (0.4 mg total) under the tongue every 5 (five) minutes as needed for chest pain. 25 tablet 12  . ONE TOUCH ULTRA TEST test strip 1 each.      No current facility-administered medications for this visit.    Allergies:   Review of patient's allergies indicates no known allergies.    Social History:  The patient  reports that he quit smoking about 16 years ago. His smoking use included Cigarettes. He has a 69 pack-year smoking history. He has never used smokeless tobacco. He reports that he drinks about 8.4 oz of alcohol per week. He reports that he uses illicit drugs (Marijuana).   Family History:  The patient's family history includes Heart attack in his father, maternal grandmother, and paternal grandfather; Heart disease in his father; Hypertension in his father; Stroke in his mother.    ROS:  Please see the history of present illness.   Otherwise, review of systems are positive for fatigue.   All other systems are reviewed and negative.    PHYSICAL EXAM: VS:  BP 100/60 mmHg  Pulse 68  Ht '5\' 8"'  (1.727 m)  Wt 197 lb 9.6 oz (89.631 kg)  BMI 30.05 kg/m2 , BMI Body mass index is 30.05 kg/(m^2). GEN: Well nourished, well developed, in no acute distress HEENT: normal Neck: no JVD, carotid bruits, or masses Cardiac: RRR; no murmurs, rubs, or gallops,no edema  Respiratory:  clear to auscultation bilaterally, normal work of breathing GI: soft, nontender, nondistended, + BS, small umbilical hernia MS: no deformity or atrophy Skin: warm and dry, no rash Neuro:  Strength and sensation are intact Psych: euthymic mood,  full affect      Recent Labs: No results found for requested labs within last 365 days.   Lipid Panel No results found for: CHOL, TRIG, HDL, CHOLHDL, VLDL, LDLCALC, LDLDIRECT   Other studies Reviewed: Additional studies/ records that were reviewed today with results demonstrating: cath report 2015.   ASSESSMENT AND PLAN:  1. CAD: He  had some symptoms which were concerning him for angina. Feels llike a muscle pull in his upper chest.  There is no throat tightness involved which he has had before all of his prior revascularizations. Given that the symptoms are not like the previous, we'll hold off on any type of angiogram. Continue B-12 supplementation with his primary care doctor.  THis has helped him in the past. He'll let us know if his symptoms get worse.  Stop aspirin. COntinue Effient. 2. Hyperlipidemia: Continue atorvastatin. LDL target less than 100.  He has not checked lipids since losing the weight.  This may help his lipids as well.   3. Hypertension: Continue current medicines. Blood pressure well controlled.  May need less meds as he loses weight. 4. Diabetes: Control improved more recently. Managed by his primary care doctor.   Current medicines are reviewed at length with the patient today.  The patient concerns regarding his medicines were addressed.  The following changes have been made:  No change  Labs/ tests ordered today include:  No orders of the defined types were placed in this encounter.    Recommend 150 minutes/week of aerobic exercise Low fat, low carb, high fiber diet recommended  Disposition:   FU in 6 months   Teresita Madura., MD  12/01/2015 8:38 AM    [facility] [place], [place], [place]  [address] Phone: [phone]; Fax: [phone]

## 2015-12-01 NOTE — Patient Instructions (Signed)
Medication Instructions:  Stop taking Aspirin-All of your other medications will remain the same.  Labwork: None  Testing/Procedures: None  Follow-Up: Your physician wants you to follow-up in: 6 months. You will receive a reminder letter in the mail two months in advance. If you don't receive a letter, please call our office to schedule the follow-up appointment.     If you need a refill on your cardiac medications before your next appointment, please call your pharmacy.

## 2015-12-29 ENCOUNTER — Other Ambulatory Visit: Payer: Self-pay | Admitting: Interventional Cardiology

## 2016-03-25 ENCOUNTER — Other Ambulatory Visit: Payer: Self-pay | Admitting: *Deleted

## 2016-03-25 MED ORDER — ATORVASTATIN CALCIUM 80 MG PO TABS: 80.0000 mg | ORAL_TABLET | Freq: Every day | ORAL | Status: DC

## 2016-03-25 NOTE — Telephone Encounter (Signed)
Can give refill for 30 tablets with 2 refills. His lab work will be discussed at his f/u in September. Thanks, [employer] CompanyLynn

## 2016-03-25 NOTE — Telephone Encounter (Signed)
Please advise on request as there is not a recent lipid panel in epic and per last office visit:Hyperlipidemia: Continue atorvastatin. LDL target less than 100. He has not checked lipids since losing the weight. This may help his lipids as well.Thanks, MI

## 2016-04-11 ENCOUNTER — Encounter: Payer: Self-pay | Admitting: Interventional Cardiology

## 2016-05-02 ENCOUNTER — Other Ambulatory Visit: Payer: Self-pay | Admitting: Interventional Cardiology

## 2016-06-05 NOTE — Progress Notes (Signed)
Patient Troy Martinez: Troy Martinez, male   DOB: 10/15/1960, 55 y.o.   MRN: [id number]     Cardiology Office Note   Date:  06/06/2016   Troy Martinez:  [name], DOB 04-19-1961, MRN [id number]  PCP:  Carlus Pavlov, MD    No chief complaint on file. CAD   Wt Readings from Last 3 Encounters:  06/06/16 210 lb 3.2 oz (95.3 kg)  12/01/15 197 lb 9.6 oz (89.6 kg)  05/08/15 205 lb 6.4 oz (93.2 kg)       History of Present Illness: Troy Martinez is a 55 y.o. male  who has had CAD. He has had stents to the circumflex and RCA in the past. He had a stent to the OM in July 2015. That was the most recent PCI. Angina in the past has been pain in the throat and DOE with exertion.   In 2016, Felt tired and w/u showed B12 deficiency, iron deficiency and Vit C defic per his report.  He is supplementing these, previously with injection but now with oral. He has had improvement and is feeling much better.  He has not had any throat tightness.    He had a ruptured disc in 8/15 but treat,ment was delayed due to the DAPT.  He recovered with rehab exercises. He has not required any surgery. He is still having some back pain will be evaluated again.  Epidural is being considered.  At the last visit, he had lost weight doing [employer].  He has gained weight back since that time.    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  As long as he does not bend and twist, then his back his ok.  With his depression noted below, this activity decreased.  This is likely the cause of his weight gain.   He has had depression.  He started Lexapro, and this has helped.    No further tightness in the chest.  Rare sharp pains in the chest lasting a secod or two,not related to exertion. Different from prior angina sx.  No NTG use.    Past Medical History:  Diagnosis Date  . Arthritis   . Coronary artery disease    a. DES to LCx (2008)  b.  DES to RCA (2012) c. Overlapping DESx2 to large ramus (03/2014)  . GERD (gastroesophageal reflux disease)   . High cholesterol   . Hypertension   . Obesity   . Type II diabetes mellitus (Troy Martinez)     Past Surgical History:  Procedure Laterality Date  . ANTERIOR CERVICAL DECOMP/DISCECTOMY FUSION  05/2005  . BACK SURGERY    . CARDIAC CATHETERIZATION  06/2007   . CORONARY ANGIOPLASTY WITH STENT PLACEMENT  06/2007 11/2010   DES in distal circumflex ([facility]) w normal LV function 11/2010 DES to RCA-Promus  . CORONARY ANGIOPLASTY WITH STENT PLACEMENT  04/04/2014   "2; got a total of 4 now"  . DECOMPRESSION FASCIOTOMY LEG Right 2003  . LEFT HEART CATHETERIZATION WITH CORONARY ANGIOGRAM N/A 04/04/2014   Procedure: LEFT HEART CATHETERIZATION WITH CORONARY ANGIOGRAM;  Surgeon: Jettie Booze, MD;  Location: [facility] CATH LAB;  Service: Cardiovascular;  Laterality: N/A;  . LEG SURGERY Right    "cut scar tissue out"  . ORIF PROXIMAL TIBIAL PLATEAU FRACTURE Right 2003  . PERCUTANEOUS STENT INTERVENTION  04/04/2014   Procedure: PERCUTANEOUS STENT INTERVENTION;  Surgeon: Conception Oms  Hassell Done, MD;  Location: [facility] CATH LAB;  Service: Cardiovascular;;  DES x 2 Ramus 2.5x8, 2.5x32   . TIBIA HARDWARE REMOVAL Right ?2004     Current Outpatient Prescriptions  Medication Sig Dispense Refill  . acetaminophen (TYLENOL) 325 MG tablet Take 650 mg by mouth every 6 (six) hours as needed for mild pain, fever or headache.     . Ascorbic Acid (VITAMIN C) 500 MG CHEW Chew 250 mg by mouth daily.    Marland Kitchen atorvastatin (LIPITOR) 80 MG tablet Take 1 tablet (80 mg total) by mouth daily. Please call and schedule an appointment for September 30 tablet 2  . BYSTOLIC 5 MG tablet TAKE 1 TABLET EVERY DAY 90 tablet 3  . Cyanocobalamin (B-12 COMPLIANCE INJECTION) 1000 MCG/ML KIT Inject as directed every 30 (thirty) days.     Marland Kitchen esomeprazole (NEXIUM) 20 MG capsule Take 20 mg by mouth daily at 12 noon.    Marland Kitchen Fe-Succ Ac-C-Thre Ac-B12-FA (FERREX  150 FORTE PLUS) 50-100 MG CAPS Take 1 capsule by mouth at bedtime. Take by mouth as directed    . fenofibrate 160 MG tablet TAKE 1 TABLET EVERY DAY 90 tablet 3  . gabapentin (NEURONTIN) 300 MG capsule Take 300 mg by mouth 3 (three) times daily.  2  . glimepiride (AMARYL) 1 MG tablet Take 0.5 mg by mouth daily with breakfast.     . JANUMET XR 50-1000 MG TB24 Take 1 tablet by mouth 2 (two) times daily.  3  . lisinopril (PRINIVIL,ZESTRIL) 10 MG tablet Take 5 mg by mouth daily.  12  . meloxicam (MOBIC) 15 MG tablet Take 15 mg by mouth daily as needed for pain. ARM PAIN    . nitroGLYCERIN (NITROSTAT) 0.4 MG SL tablet Place 0.4 mg under the tongue every 5 (five) minutes as needed for chest pain. 3 DOSES MAX    . ONE TOUCH ULTRA TEST test strip 1 each.     . prasugrel (EFFIENT) 10 MG TABS tablet Take 1 tablet (10 mg total) by mouth daily. 30 tablet 6  . escitalopram (LEXAPRO) 20 MG tablet Take 20 mg by mouth at bedtime.      No current facility-administered medications for this visit.     Allergies:   Review of patient's allergies indicates no known allergies.    Social History:  The patient  reports that he quit smoking about 17 years ago. His smoking use included Cigarettes. He has a 69.00 pack-year smoking history. He has never used smokeless tobacco. He reports that he drinks about 8.4 oz of alcohol per week . He reports that he uses drugs, including Marijuana.   Family History:  The patient's family history includes Heart attack in his father, maternal grandmother, and paternal grandfather; Heart disease in his father; Hypertension in his father; Stroke in his mother.    ROS:  Please see the history of present illness.   Otherwise, review of systems are positive for fatigue, right knee pain.   All other systems are reviewed and negative.    PHYSICAL EXAM: VS:  BP 110/60   Pulse (!) 53   Ht _0  (1.727 m)   Wt 210 lb 3.2 oz (95.3 kg)   BMI 31.96 kg/m  , BMI Body mass index is 31.96  kg/m. GEN: Well nourished, well developed, in no acute distress  HEENT: normal  Neck: no JVD, carotid bruits, or masses Cardiac: RRR; no murmurs, rubs, or gallops,no edema  Respiratory:  clear to auscultation bilaterally, normal work of  breathing GI: soft, nontender, nondistended, + BS, small umbilical hernia MS: no deformity or atrophy  Skin: warm and dry, no rash Neuro:  Strength and sensation are intact Psych: euthymic mood, full affect   ECG: sinus bradycardia, no ST segment changes   Recent Labs: No results found for requested labs within last 8760 hours.   Lipid Panel No results found for: CHOL, TRIG, HDL, CHOLHDL, VLDL, LDLCALC, LDLDIRECT   Other studies Reviewed: Additional studies/ records that were reviewed today with results demonstrating: cath report 2015.   ASSESSMENT AND PLAN:  1. CAD: No angina.  Increase activity.  Continue B-12 supplementation and depression treatment with his primary care doctor.  THis has helped him in the past. He'll let us know if his anginal symptoms return.  Stop aspirin. COntinue Effient.  No bleeding problems.   2. Hyperlipidemia: Continue atorvastatin. LDL target less than 100.  Close to target per the patient.  Checked with PMD.   This may help his lipids as well.   3. Hypertension: Continue current medicines. Blood pressure well controlled.  May need less meds as he loses weight.  He will try the portion control attempts to lose weight.   4. Diabetes: Control improved more recently. Managed by his primary care doctor. 5. Forearm pain: injured- tennis elbow.  Was taking daily meloxicam.  I advised him to stop the meloxicam daily, but he can take it prn for a few days at a time.  6. He is considering knee replacement.  He will follow with the Dr. Alvan Dame.     Current medicines are reviewed at length with the patient today.  The patient concerns regarding his medicines were addressed.  The following changes have been made:  No  change  Labs/ tests ordered today include:  No orders of the defined types were placed in this encounter.   Recommend 150 minutes/week of aerobic exercise Low fat, low carb, high fiber diet recommended  Disposition:   FU in 6 months   Signed, Larae Grooms, MD  06/06/2016 9:24 AM    [facility] [place], [place],   [address] Phone: [phone]; Fax: [phone]

## 2016-06-06 ENCOUNTER — Encounter: Payer: Self-pay | Admitting: Interventional Cardiology

## 2016-06-06 ENCOUNTER — Ambulatory Visit (INDEPENDENT_AMBULATORY_CARE_PROVIDER_SITE_OTHER): Payer: 59 | Admitting: Interventional Cardiology

## 2016-06-06 VITALS — BP 110/60 | HR 53 | Ht 68.0 in | Wt 210.2 lb

## 2016-06-06 DIAGNOSIS — I2510 Atherosclerotic heart disease of native coronary artery without angina pectoris: Secondary | ICD-10-CM | POA: Diagnosis not present

## 2016-06-06 DIAGNOSIS — E782 Mixed hyperlipidemia: Secondary | ICD-10-CM | POA: Diagnosis not present

## 2016-06-06 DIAGNOSIS — E1159 Type 2 diabetes mellitus with other circulatory complications: Secondary | ICD-10-CM

## 2016-06-06 DIAGNOSIS — E669 Obesity, unspecified: Secondary | ICD-10-CM | POA: Diagnosis not present

## 2016-06-06 DIAGNOSIS — I10 Essential (primary) hypertension: Secondary | ICD-10-CM

## 2016-06-06 NOTE — Patient Instructions (Signed)
Medication Instructions:  Same-no changes  Labwork: None  Testing/Procedures: None  Follow-Up: Your physician wants you to follow-up in: 6 months. You will receive a reminder letter in the mail two months in advance. If you don't receive a letter, please call our office to schedule the follow-up appointment.      If you need a refill on your cardiac medications before your next appointment, please call your pharmacy.   

## 2016-07-31 ENCOUNTER — Other Ambulatory Visit: Payer: Self-pay | Admitting: Interventional Cardiology

## 2016-09-22 ENCOUNTER — Ambulatory Visit (INDEPENDENT_AMBULATORY_CARE_PROVIDER_SITE_OTHER): Payer: 59 | Admitting: Emergency Medicine

## 2016-09-22 VITALS — BP 112/72 | HR 65 | Temp 98.2°F | Resp 18 | Ht 68.0 in | Wt 212.0 lb

## 2016-09-22 DIAGNOSIS — M5431 Sciatica, right side: Secondary | ICD-10-CM

## 2016-09-22 DIAGNOSIS — M79604 Pain in right leg: Secondary | ICD-10-CM | POA: Diagnosis not present

## 2016-09-22 DIAGNOSIS — M5441 Lumbago with sciatica, right side: Secondary | ICD-10-CM

## 2016-09-22 MED ORDER — HYDROCODONE-ACETAMINOPHEN 5-325 MG PO TABS: 1.0000 | ORAL_TABLET | Freq: Four times a day (QID) | ORAL | 0 refills | Status: AC | PRN

## 2016-09-22 NOTE — Patient Instructions (Addendum)
   IF you received an x-ray today, you will receive an invoice from [facility]. Please contact [facility] at [phone] with questions or concerns regarding your invoice.   IF you received labwork today, you will receive an invoice from [facility]. Please contact [facility] at [phone] with questions or concerns regarding your invoice.   Our billing staff will not be able to assist you with questions regarding bills from these companies.  You will be contacted with the lab results as soon as they are available. The fastest way to get your results is to activate your My Chart account. Instructions are located on the last page of this paperwork. If you have not heard from us regarding the results in 2 weeks, please contact this office.     Sciatica Introduction Sciatica is pain, numbness, weakness, or tingling along your sciatic nerve. The sciatic nerve starts in the lower back and goes down the back of each leg. Sciatica happens when this nerve is pinched or has pressure put on it. Sciatica usually goes away on its own or with treatment. Sometimes, sciatica may keep coming back (recur). Follow these instructions at home: Medicines  Take over-the-counter and prescription medicines only as told by your doctor.  Do not drive or use heavy machinery while taking prescription pain medicine. Managing pain  If directed, put ice on the affected area.  Put ice in a plastic bag.  Place a towel between your skin and the bag.  Leave the ice on for 20 minutes, 2-3 times a day.  After icing, apply heat to the affected area before you exercise or as often as told by your doctor. Use the heat source that your doctor tells you to use, such as a moist heat pack or a heating pad.  Place a towel between your skin and the heat source.  Leave the heat on for 20-30 minutes.  Remove the heat if your skin turns bright red. This is especially important if you are unable to feel  pain, heat, or cold. You may have a greater risk of getting burned. Activity  Return to your normal activities as told by your doctor. Ask your doctor what activities are safe for you.  Avoid activities that make your sciatica worse.  Take short rests during the day. Rest in a lying or standing position. This is usually better than sitting to rest.  When you rest for a long time, do some physical activity or stretching between periods of rest.  Avoid sitting for a long time without moving. Get up and move around at least one time each hour.  Exercise and stretch regularly, as told by your doctor.  Do not lift anything that is heavier than 10 lb (4.5 kg) while you have symptoms of sciatica.  Avoid lifting heavy things even when you do not have symptoms.  Avoid lifting heavy things over and over.  When you lift objects, always lift in a way that is safe for your body. To do this, you should:  Bend your knees.  Keep the object close to your body.  Avoid twisting. General instructions  Use good posture.  Avoid leaning forward when you are sitting.  Avoid hunching over when you are standing.  Stay at a healthy weight.  Wear comfortable shoes that support your feet. Avoid wearing high heels.  Avoid sleeping on a mattress that is too soft or too hard. You might have less pain if you sleep on a mattress that is firm   enough to support your back.  Keep all follow-up visits as told by your doctor. This is important. Contact a doctor if:  You have pain that:  Wakes you up when you are sleeping.  Gets worse when you lie down.  Is worse than the pain you have had in the past.  Lasts longer than 4 weeks.  You lose weight for without trying. Get help right away if:  You cannot control when you pee (urinate) or poop (have a bowel movement).  You have weakness in any of these areas and it gets worse.  Lower back.  Lower belly (pelvis).  Butt (buttocks).  Legs.  You  have redness or swelling of your back.  You have a burning feeling when you pee. This information is not intended to replace advice given to you by your health care provider. Make sure you discuss any questions you have with your health care provider. Document Released: 06/14/2008 Document Revised: 02/11/2016 Document Reviewed: 05/15/2015  2017 [employer]  

## 2016-09-22 NOTE — Progress Notes (Signed)
Troy Martinez 56 y.o.   Chief Complaint  Patient presents with  . Leg Pain    RIGHT    HISTORY OF PRESENT ILLNESS: This is a 56 y.o. male complaining of pain to right hip/low back area that shoots down the leg x 48 hours; went camping last weekend and di a lot of strenous physical activity; thinks he injured himself.  Leg Pain       Prior to Admission medications   Medication Sig Start Date End Date Taking? Authorizing Provider  acetaminophen (TYLENOL) 325 MG tablet Take 650 mg by mouth every 6 (six) hours as needed for mild pain, fever or headache.    Yes Historical Provider, MD  Ascorbic Acid (VITAMIN C) 500 MG CHEW Chew 250 mg by mouth daily.   Yes Historical Provider, MD  atorvastatin (LIPITOR) 80 MG tablet Take 1 tablet (80 mg total) by mouth daily. 08/01/16  Yes Jettie Booze, MD  BYSTOLIC 5 MG tablet TAKE 1 TABLET EVERY DAY 12/29/15  Yes Jettie Booze, MD  Cyanocobalamin (B-12 COMPLIANCE INJECTION) 1000 MCG/ML KIT Inject as directed every 30 (thirty) days.    Yes Historical Provider, MD  escitalopram (LEXAPRO) 20 MG tablet Take 20 mg by mouth at bedtime.  06/04/16  Yes Historical Provider, MD  esomeprazole (NEXIUM) 20 MG capsule Take 20 mg by mouth daily at 12 noon.   Yes Historical Provider, MD  Fe-Succ Ac-C-Thre Ac-B12-FA (FERREX 150 FORTE PLUS) 50-100 MG CAPS Take 1 capsule by mouth at bedtime. Take by mouth as directed 04/17/15  Yes Historical Provider, MD  fenofibrate 160 MG tablet TAKE 1 TABLET EVERY DAY 12/29/15  Yes Jettie Booze, MD  gabapentin (NEURONTIN) 300 MG capsule Take 300 mg by mouth 3 (three) times daily. 03/28/15  Yes Historical Provider, MD  glimepiride (AMARYL) 1 MG tablet Take 0.5 mg by mouth daily with breakfast.    Yes Historical Provider, MD  JANUMET XR 50-1000 MG TB24 Take 1 tablet by mouth 2 (two) times daily. 04/14/15  Yes Historical Provider, MD  lisinopril (PRINIVIL,ZESTRIL) 10 MG tablet Take 5 mg by mouth daily. 04/13/15  Yes  Historical Provider, MD  meloxicam (MOBIC) 15 MG tablet Take 15 mg by mouth daily as needed for pain. ARM PAIN 03/25/16  Yes Historical Provider, MD  nitroGLYCERIN (NITROSTAT) 0.4 MG SL tablet Place 0.4 mg under the tongue every 5 (five) minutes as needed for chest pain. 3 DOSES MAX   Yes Historical Provider, MD  ONE TOUCH ULTRA TEST test strip 1 each.  11/02/13  Yes Historical Provider, MD  prasugrel (EFFIENT) 10 MG TABS tablet Take 1 tablet (10 mg total) by mouth daily. 05/02/16  Yes Jettie Booze, MD    No Known Allergies  Patient Active Problem List   Diagnosis Date Noted  . Unstable angina ([place]) 04/05/2014  . Coronary artery disease   . GERD (gastroesophageal reflux disease)   . Type II diabetes mellitus ([place])   . Mixed hyperlipidemia 11/27/2013  . Essential hypertension, benign 11/27/2013  . Obesity 11/27/2013    Past Medical History:  Diagnosis Date  . Arthritis   . Coronary artery disease    a. DES to LCx (2008)  b. DES to RCA (2012) c. Overlapping DESx2 to large ramus (03/2014)  . GERD (gastroesophageal reflux disease)   . High cholesterol   . Hypertension   . Obesity   . Type II diabetes mellitus ([place])     Past Surgical History:  Procedure Laterality Date  .  ANTERIOR CERVICAL DECOMP/DISCECTOMY FUSION  05/2005  . BACK SURGERY    . CARDIAC CATHETERIZATION  06/2007   . CORONARY ANGIOPLASTY WITH STENT PLACEMENT  06/2007 11/2010   DES in distal circumflex ([facility]) w normal LV function 11/2010 DES to RCA-Promus  . CORONARY ANGIOPLASTY WITH STENT PLACEMENT  04/04/2014   "2; got a total of 4 now"  . DECOMPRESSION FASCIOTOMY LEG Right 2003  . LEFT HEART CATHETERIZATION WITH CORONARY ANGIOGRAM N/A 04/04/2014   Procedure: LEFT HEART CATHETERIZATION WITH CORONARY ANGIOGRAM;  Surgeon: Jettie Booze, MD;  Location: [facility] CATH LAB;  Service: Cardiovascular;  Laterality: N/A;  . LEG SURGERY Right    "cut scar tissue out"  . ORIF PROXIMAL TIBIAL PLATEAU FRACTURE Right 2003  .  PERCUTANEOUS STENT INTERVENTION  04/04/2014   Procedure: PERCUTANEOUS STENT INTERVENTION;  Surgeon: Jettie Booze, MD;  Location: [facility] CATH LAB;  Service: Cardiovascular;;  DES x 2 Ramus 2.5x8, 2.5x32   . TIBIA HARDWARE REMOVAL Right ?2004    Social History   Social History  . Marital status: Married    Spouse name: N/A  . Number of children: N/A  . Years of education: N/A   Occupational History  . Not on file.   Social History Main Topics  . Smoking status: Former Smoker    Packs/day: 3.00    Years: 23.00    Types: Cigarettes    Quit date: 01/18/1999  . Smokeless tobacco: Never Used  . Alcohol use 8.4 oz/week    14 Shots of liquor per week     Comment: 04/04/2014 "2 shots/day on average"  . Drug use:     Types: Marijuana     Comment: "quit marijuana in 1994"  . Sexual activity: Yes   Other Topics Concern  . Not on file   Social History Narrative  . No narrative on file    Family History  Problem Relation Age of Onset  . Stroke Mother   . Heart disease Father   . Heart attack Father   . Hypertension Father   . Heart attack Maternal Grandmother   . Heart attack Paternal Grandfather      Review of Systems  Constitutional: Negative for chills and fever.  HENT: Negative.   Eyes: Negative.   Respiratory: Negative.   Cardiovascular: Negative.   Gastrointestinal: Negative for abdominal pain, nausea and vomiting.  Genitourinary: Negative for dysuria, flank pain, hematuria and urgency.  Musculoskeletal: Positive for back pain. Negative for falls.  Skin: Negative.   Neurological: Negative.   Endo/Heme/Allergies: Negative.   Psychiatric/Behavioral: Negative.   All other systems reviewed and are negative.  Vitals:   09/22/16 1359  BP: 112/72  Pulse: 65  Resp: 18  Temp: 98.2 F (36.8 C)     Physical Exam  Constitutional: He is oriented to person, place, and time. He appears well-developed and well-nourished.  HENT:  Head: Normocephalic and atraumatic.    Eyes: EOM are normal. Pupils are equal, round, and reactive to light.  Neck: Normal range of motion. Neck supple.  Cardiovascular: Normal rate, regular rhythm, normal heart sounds and intact distal pulses.   Pulmonary/Chest: Effort normal and breath sounds normal.  Abdominal: Soft. Bowel sounds are normal. He exhibits no distension. There is no tenderness.  Musculoskeletal:       Right hip: Normal.       Right knee: Normal. He exhibits no swelling.       Lumbar back: He exhibits decreased range of motion and tenderness. He exhibits no  bony tenderness and no swelling.  RLE:NVI with FROM; old surgical scar; chronic calf swelling; no erythema or tenderness.  Neurological: He is alert and oriented to person, place, and time. He displays normal reflexes. No sensory deficit. He exhibits normal muscle tone. Coordination normal.  Skin: Skin is warm and dry. Capillary refill takes less than 2 seconds.  Psychiatric: He has a normal mood and affect. His behavior is normal.  Vitals reviewed.    ASSESSMENT & PLAN: Kais was seen today for leg pain.  Diagnoses and all orders for this visit:  Sciatica of right side  Right leg pain  Acute right-sided low back pain with right-sided sciatica  Other orders -     HYDROcodone-acetaminophen (NORCO) 5-325 MG tablet; Take 1 tablet by mouth every 6 (six) hours as needed for moderate pain.    Patient Instructions       IF you received an x-ray today, you will receive an invoice from [facility]. Please contact [facility] at [phone] with questions or concerns regarding your invoice.   IF you received labwork today, you will receive an invoice from [facility]. Please contact [facility] at [phone] with questions or concerns regarding your invoice.   Our billing staff will not be able to assist you with questions regarding bills from these companies.  You will be contacted with the lab results as soon as they are  available. The fastest way to get your results is to activate your My Chart account. Instructions are located on the last page of this paperwork. If you have not heard from [place] regarding the results in 2 weeks, please contact this office.     Sciatica Introduction Sciatica is pain, numbness, weakness, or tingling along your sciatic nerve. The sciatic nerve starts in the lower back and goes down the back of each leg. Sciatica happens when this nerve is pinched or has pressure put on it. Sciatica usually goes away on its own or with treatment. Sometimes, sciatica may keep coming back (recur). Follow these instructions at home: Medicines  Take over-the-counter and prescription medicines only as told by your doctor.  Do not drive or use heavy machinery while taking prescription pain medicine. Managing pain  If directed, put ice on the affected area.  Put ice in a plastic bag.  Place a towel between your skin and the bag.  Leave the ice on for 20 minutes, 2-3 times a day.  After icing, apply heat to the affected area before you exercise or as often as told by your doctor. Use the heat source that your doctor tells you to use, such as a moist heat pack or a heating pad.  Place a towel between your skin and the heat source.  Leave the heat on for 20-30 minutes.  Remove the heat if your skin turns bright red. This is especially important if you are unable to feel pain, heat, or cold. You may have a greater risk of getting burned. Activity  Return to your normal activities as told by your doctor. Ask your doctor what activities are safe for you.  Avoid activities that make your sciatica worse.  Take short rests during the day. Rest in a lying or standing position. This is usually better than sitting to rest.  When you rest for a long time, do some physical activity or stretching between periods of rest.  Avoid sitting for a long time without moving. Get up and move around at least one  time each hour.  Exercise  and stretch regularly, as told by your doctor.  Do not lift anything that is heavier than 10 lb (4.5 kg) while you have symptoms of sciatica.  Avoid lifting heavy things even when you do not have symptoms.  Avoid lifting heavy things over and over.  When you lift objects, always lift in a way that is safe for your body. To do this, you should:  Bend your knees.  Keep the object close to your body.  Avoid twisting. General instructions  Use good posture.  Avoid leaning forward when you are sitting.  Avoid hunching over when you are standing.  Stay at a healthy weight.  Wear comfortable shoes that support your feet. Avoid wearing high heels.  Avoid sleeping on a mattress that is too soft or too hard. You might have less pain if you sleep on a mattress that is firm enough to support your back.  Keep all follow-up visits as told by your doctor. This is important. Contact a doctor if:  You have pain that:  Wakes you up when you are sleeping.  Gets worse when you lie down.  Is worse than the pain you have had in the past.  Lasts longer than 4 weeks.  You lose weight for without trying. Get help right away if:  You cannot control when you pee (urinate) or poop (have a bowel movement).  You have weakness in any of these areas and it gets worse.  Lower back.  Lower belly (pelvis).  Butt (buttocks).  Legs.  You have redness or swelling of your back.  You have a burning feeling when you pee. This information is not intended to replace advice given to you by your health care provider. Make sure you discuss any questions you have with your health care provider. Document Released: 06/14/2008 Document Revised: 02/11/2016 Document Reviewed: 05/15/2015  2017 Elsevier      Agustina Caroli, MD [facility]

## 2016-09-28 DIAGNOSIS — D510 Vitamin B12 deficiency anemia due to intrinsic factor deficiency: Secondary | ICD-10-CM | POA: Diagnosis not present

## 2016-10-21 DIAGNOSIS — E538 Deficiency of other specified B group vitamins: Secondary | ICD-10-CM | POA: Diagnosis not present

## 2016-11-24 ENCOUNTER — Encounter: Payer: Self-pay | Admitting: Interventional Cardiology

## 2016-11-29 ENCOUNTER — Other Ambulatory Visit: Payer: Self-pay | Admitting: Interventional Cardiology

## 2016-11-30 NOTE — Progress Notes (Signed)
Patient ID: Troy Martinez, male   DOB: [birth year], 56 y.o.   MRN: [id number]     Cardiology Office Note   Date:  12/01/2016   ID:  Troy Martinez, DOB [birth year], MRN [id number]  PCP:  Troy See, MD    Chief Complaint  Patient presents with  . Follow-up    SOB/Diziness  CAD   Wt Readings from Last 3 Encounters:  12/01/16 212 lb 6.4 oz (96.3 kg)  09/22/16 212 lb (96.2 kg)  06/06/16 210 lb 3.2 oz (95.3 kg)       History of Present Illness: Troy Martinez is a 56 y.o. male  who has had CAD. He has had stents to the circumflex and RCA in the past. He had a stent to the OM in July 2015. That was the most recent PCI. Angina in the past has been pain in the throat and DOE with exertion.   In 2016, Felt tired and w/u showed B12 deficiency, iron deficiency and Vit C defic per his report.  He is supplementing these, previously with injection but now with oral. He has had improvement and is feeling much better.  He has not had any throat tightness.    He had a ruptured disc in 8/15 but treatment was delayed due to the DAPT.  He recovered with rehab exercises. He has not required any surgery or epidural.  In 2017, he had lost weight doing Nutrisystem.  He has gained weight back since that time.    He has not been walking for exercise.  He still has to carry 50 lb bag of chicken feed.   With his depression noted below, this activity decreased.  This is likely the cause of his weight gain.   He has had depression.  He started Lexapro, and this has helped.    Occasional DOE.  No neck pain like his prior angina.  Different from prior angina sx.  No NTG use.    Past Medical History:  Diagnosis Date  . Arthritis   . Coronary artery disease    a. DES to LCx (2008)  b. DES to RCA (2012) c. Overlapping DESx2 to large ramus (03/2014)  . GERD (gastroesophageal reflux disease)   . High cholesterol   . Hypertension   . Obesity   . Type II diabetes mellitus ([place])     Past  Surgical History:  Procedure Laterality Date  . ANTERIOR CERVICAL DECOMP/DISCECTOMY FUSION  05/2005  . BACK SURGERY    . CARDIAC CATHETERIZATION  06/2007   . CORONARY ANGIOPLASTY WITH STENT PLACEMENT  06/2007 11/2010   DES in distal circumflex ([facility]) w normal LV function 11/2010 DES to RCA-Promus  . CORONARY ANGIOPLASTY WITH STENT PLACEMENT  04/04/2014   "2; got a total of 4 now"  . DECOMPRESSION FASCIOTOMY LEG Right 2003  . LEFT HEART CATHETERIZATION WITH CORONARY ANGIOGRAM N/A 04/04/2014   Procedure: LEFT HEART CATHETERIZATION WITH CORONARY ANGIOGRAM;  Surgeon: Jettie Booze, MD;  Location[facility] C[facility] LAB;  Service: Cardiovascular;  Laterality: N/A;  . LEG SURGERY Right    "cut scar tissue out"  . ORIF PROXIMAL TIBIAL PLATEAU FRACTURE Right 2003  . PERCUTANEOUS STENT INTERVENTION  04/04/2014   Procedure: PERCUTANEOUS STENT INTERVENTION;  Surgeon: Jettie Booze, MD;  Location: [facility] CATH LAB;  Service: Cardiovascular;;  DES x 2 Ramus 2.5x8, 2.5x32   . TIBIA HARDWARE REMOVAL Right ?2004     Current Outpatient Prescriptions  Medication Sig Dispense Refill  . acetaminophen (TYLENOL)  325 MG tablet Take 650 mg by mouth every 6 (six) hours as needed for mild pain, fever or headache.     . Ascorbic Acid (VITAMIN C) 500 MG CHEW Chew 250 mg by mouth daily.    Marland Kitchen atorvastatin (LIPITOR) 80 MG tablet Take 1 tablet (80 mg total) by mouth daily. 30 tablet 10  . buPROPion (WELLBUTRIN SR) 150 MG 12 hr tablet Take 150 mg by mouth 2 (two) times daily.  1  . BYSTOLIC 5 MG tablet TAKE 1 TABLET EVERY DAY 90 tablet 3  . Cyanocobalamin (B-12 COMPLIANCE INJECTION) 1000 MCG/ML KIT Inject as directed every 30 (thirty) days.     Marland Kitchen escitalopram (LEXAPRO) 20 MG tablet Take 20 mg by mouth at bedtime.     Marland Kitchen esomeprazole (NEXIUM) 20 MG capsule Take 20 mg by mouth daily at 12 noon.    Marland Kitchen Fe-Succ Ac-C-Thre Ac-B12-FA (FERREX 150 FORTE PLUS) 50-100 MG CAPS Take 1 capsule by mouth at bedtime. Take by mouth as directed     . fenofibrate 160 MG tablet TAKE 1 TABLET EVERY DAY 90 tablet 3  . gabapentin (NEURONTIN) 300 MG capsule Take 300 mg by mouth 3 (three) times daily.  2  . glimepiride (AMARYL) 1 MG tablet Take 0.5 mg by mouth daily with breakfast.     . JANUMET XR 50-1000 MG TB24 Take 1 tablet by mouth 2 (two) times daily.  3  . lisinopril (PRINIVIL,ZESTRIL) 10 MG tablet Take 5 mg by mouth daily.  12  . nitroGLYCERIN (NITROSTAT) 0.4 MG SL tablet Place 0.4 mg under the tongue every 5 (five) minutes as needed for chest pain. 3 DOSES MAX    . ONE TOUCH ULTRA TEST test strip 1 each.     . prasugrel (EFFIENT) 10 MG TABS tablet TAKE 1 TABLET (10 MG TOTAL) BY MOUTH DAILY. 30 tablet 5   No current facility-administered medications for this visit.     Allergies:   Patient has no known allergies.    Social History:  The patient  reports that he quit smoking about 17 years ago. His smoking use included Cigarettes. He has a 69.00 pack-year smoking history. He has never used smokeless tobacco. He reports that he drinks about 8.4 oz of alcohol per week . He reports that he uses drugs, including Marijuana.   Family History:  The patient's family history includes Heart attack in his father, maternal grandmother, and paternal grandfather; Heart disease in his father; Hypertension in his father; Stroke in his mother.    ROS:  Please Martinez the history of present illness.   Otherwise, review of systems are positive for fatigue, right knee pain- improved.   All other systems are reviewed and negative.    PHYSICAL EXAM: VS:  BP 122/80   Pulse 66   Ht '5\' 8"'  (1.727 m)   Wt 212 lb 6.4 oz (96.3 kg)   BMI 32.30 kg/m  , BMI Body mass index is 32.3 kg/m. GEN: Well nourished, well developed, in no acute distress  HEENT: normal  Neck: no JVD, carotid bruits, or masses Cardiac: RRR; no murmurs, rubs, or gallops,no edema  Respiratory:  clear to auscultation bilaterally, normal work of breathing GI: soft, nontender,  nondistended, + BS, small umbilical hernia MS: no deformity or atrophy  Skin: warm and dry, no rash Neuro:  Strength and sensation are intact Psych: euthymic mood, full affect   ECG: sinus bradycardia, no ST segment changes   Recent Labs: No results found for requested labs  within last 8760 hours.   Lipid Panel No results found for: CHOL, TRIG, HDL, CHOLHDL, VLDL, LDLCALC, LDLDIRECT   Other studies Reviewed: Additional studies/ records that were reviewed today with results demonstrating: cath report 2015.   ASSESSMENT AND PLAN:  1. CAD: Angina controlled on current meds.  Increase activity.  In the past, when he has had anginal symptoms and catheterization was performed, he has always required PCI. If he has recurrent symptoms that are typical for his angina, would not pursue stress testing. Would just go straight to catheterization. Continue B-12 supplementation and depression treatment with his primary care doctor.  THis has helped him in the past. He'll let us know if his anginal symptoms return.  Stopped aspirin. COntinue Effient.  No bleeding problems.   2. Hyperlipidemia: Continue atorvastatin. We'll check labs today.  3. Hypertension: Continue current medicines. Blood pressure well controlled.  May need less meds as he loses weight.  He will try the portion control attempts to lose weight.  He had success with Nutrisystem in the past. He thinks he can try to replicate this at home. 4. Diabetes: Not checking sugars frequently. Have asked him to try to eat healthy and control portions as noted above. Managed by his primary care doctor. 5. Forearm pain: injured- tennis elbow.  Improved.  Was taking daily meloxicam.  I advised him to stop the meloxicam daily, but he can take it prn for a few days at a time. He stopped it altogether.  6. He does not need knee replacement, at this time.  He will follow with the Dr. Alvan Dame.     Current medicines are reviewed at length with the patient  today.  The patient concerns regarding his medicines were addressed.  The following changes have been made:  No change  Labs/ tests ordered today include:  No orders of the defined types were placed in this encounter.   Recommend 150 minutes/week of aerobic exercise Low fat, low carb, high fiber diet recommended  Disposition:   FU in 6 months   Signed, Larae Grooms, MD  12/01/2016 8:45 AM    [address], [place], [place]  [address] Phone: [phone]; Fax: [phone]

## 2016-12-01 ENCOUNTER — Encounter: Payer: Self-pay | Admitting: Interventional Cardiology

## 2016-12-01 ENCOUNTER — Ambulatory Visit (INDEPENDENT_AMBULATORY_CARE_PROVIDER_SITE_OTHER): Payer: 59 | Admitting: Interventional Cardiology

## 2016-12-01 VITALS — BP 122/80 | HR 66 | Ht 68.0 in | Wt 212.4 lb

## 2016-12-01 DIAGNOSIS — E782 Mixed hyperlipidemia: Secondary | ICD-10-CM

## 2016-12-01 DIAGNOSIS — I10 Essential (primary) hypertension: Secondary | ICD-10-CM

## 2016-12-01 DIAGNOSIS — E1159 Type 2 diabetes mellitus with other circulatory complications: Secondary | ICD-10-CM

## 2016-12-01 DIAGNOSIS — I25118 Atherosclerotic heart disease of native coronary artery with other forms of angina pectoris: Secondary | ICD-10-CM | POA: Diagnosis not present

## 2016-12-01 LAB — COMPREHENSIVE METABOLIC PANEL
ALBUMIN: 4.7 g/dL (ref 3.5–5.5)
ALT: 25 IU/L (ref 0–44)
AST: 28 IU/L (ref 0–40)
Albumin/Globulin Ratio: 2.2 (ref 1.2–2.2)
Alkaline Phosphatase: 45 IU/L (ref 39–117)
BILIRUBIN TOTAL: 0.4 mg/dL (ref 0.0–1.2)
BUN/Creatinine Ratio: 22 — ABNORMAL HIGH (ref 9–20)
BUN: 20 mg/dL (ref 6–24)
CALCIUM: 9.9 mg/dL (ref 8.7–10.2)
CHLORIDE: 100 mmol/L (ref 96–106)
CO2: 25 mmol/L (ref 18–29)
CREATININE: 0.92 mg/dL (ref 0.76–1.27)
GFR calc Af Amer: 108 mL/min/{1.73_m2} (ref >59)
GFR calc non Af Amer: 93 mL/min/{1.73_m2} (ref >59)
Globulin, Total: 2.1 g/dL (ref 1.5–4.5)
Glucose: 114 mg/dL — ABNORMAL HIGH (ref 65–99)
Potassium: 4.6 mmol/L (ref 3.5–5.2)
Sodium: 142 mmol/L (ref 134–144)
TOTAL PROTEIN: 6.8 g/dL (ref 6.0–8.5)

## 2016-12-01 LAB — LIPID PANEL
CHOLESTEROL TOTAL: 123 mg/dL (ref 100–199)
Chol/HDL Ratio: 5.1 ratio units — ABNORMAL HIGH (ref 0.0–5.0)
HDL: 24 mg/dL — AB (ref >39)
LDL Calculated: 61 mg/dL (ref 0–99)
TRIGLYCERIDES: 192 mg/dL — AB (ref 0–149)
VLDL CHOLESTEROL CAL: 38 mg/dL (ref 5–40)

## 2016-12-01 NOTE — Patient Instructions (Signed)
Your physician recommends that you continue on your current medications as directed. Please refer to the Current Medication list given to you today.  Your physician recommends that you return for lab work today (cmet, lipids)  Your physician wants you to follow-up in: 6 months with Dr. Eldridge DaceVaranasi.  You will receive a reminder letter in the mail two months in advance. If you don't receive a letter, please call our office to schedule the follow-up appointment.

## 2016-12-06 ENCOUNTER — Telehealth: Payer: Self-pay | Admitting: Interventional Cardiology

## 2016-12-06 DIAGNOSIS — D510 Vitamin B12 deficiency anemia due to intrinsic factor deficiency: Secondary | ICD-10-CM | POA: Diagnosis not present

## 2016-12-06 NOTE — Telephone Encounter (Signed)
New message    Pt calling [place] back. States left message and he was unsure why, just returning phone call

## 2016-12-06 NOTE — Telephone Encounter (Signed)
-----   Message from Corky CraftsJayadeep S Varanasi, MD sent at 12/03/2016  6:05 PM EDT ----- Glucose mildly increased.  Liver, kidney tests controlled.  Lipids controlled.

## 2016-12-06 NOTE — Telephone Encounter (Signed)
Patient made aware of results. Patient verbalizes understanding.  

## 2016-12-13 DIAGNOSIS — E119 Type 2 diabetes mellitus without complications: Secondary | ICD-10-CM | POA: Diagnosis not present

## 2017-01-26 DIAGNOSIS — D510 Vitamin B12 deficiency anemia due to intrinsic factor deficiency: Secondary | ICD-10-CM | POA: Diagnosis not present

## 2017-01-30 DIAGNOSIS — B9689 Other specified bacterial agents as the cause of diseases classified elsewhere: Secondary | ICD-10-CM | POA: Diagnosis not present

## 2017-01-30 DIAGNOSIS — J0410 Acute tracheitis without obstruction: Secondary | ICD-10-CM | POA: Diagnosis not present

## 2017-01-30 DIAGNOSIS — J029 Acute pharyngitis, unspecified: Secondary | ICD-10-CM | POA: Diagnosis not present

## 2017-02-06 ENCOUNTER — Other Ambulatory Visit: Payer: Self-pay | Admitting: Interventional Cardiology

## 2017-02-20 DIAGNOSIS — I10 Essential (primary) hypertension: Secondary | ICD-10-CM | POA: Diagnosis not present

## 2017-02-20 DIAGNOSIS — Z125 Encounter for screening for malignant neoplasm of prostate: Secondary | ICD-10-CM | POA: Diagnosis not present

## 2017-02-20 DIAGNOSIS — E1142 Type 2 diabetes mellitus with diabetic polyneuropathy: Secondary | ICD-10-CM | POA: Diagnosis not present

## 2017-02-20 DIAGNOSIS — Z0000 Encounter for general adult medical examination without abnormal findings: Secondary | ICD-10-CM | POA: Diagnosis not present

## 2017-04-03 ENCOUNTER — Telehealth: Payer: Self-pay | Admitting: Interventional Cardiology

## 2017-04-03 DIAGNOSIS — D510 Vitamin B12 deficiency anemia due to intrinsic factor deficiency: Secondary | ICD-10-CM | POA: Diagnosis not present

## 2017-04-03 NOTE — Telephone Encounter (Signed)
New message      Pt c/o Shortness Of Breath: STAT if SOB developed within the last 24 hours or pt is noticeably SOB on the phone  1. Are you currently SOB (can you hear that pt is SOB on the phone)?  no 2. How long have you been experiencing SOB?  Getting worse over the last few days---started approx a couple weeks ago 3. Are you SOB when sitting or when up moving around?  Mostly moving around but sweating when sitting still 4. Are you currently experiencing any other symptoms?  Sweating a lot, little dizzy

## 2017-04-03 NOTE — Telephone Encounter (Signed)
Left message for patient to call back  

## 2017-04-04 NOTE — Telephone Encounter (Signed)
Patient is usually right about his CAD when he has sx.  WOuld have a low threshold for cath, and will plan for this on Thursday.  Will have him seen tomorrow if possible to set up for cath on Thursday.

## 2017-04-04 NOTE — Telephone Encounter (Signed)
Patient scheduled appointment with Dr. Katrinka BlazingSmith tomorrow at 2:00PM. Patient will be set up for heart cath with Dr. Eldridge DaceVaranasi on Thursday. Patient made aware and is in agreement with this plan.

## 2017-04-04 NOTE — Telephone Encounter (Signed)
Follow up      Returning a call to the nurse.  Please call pt at work at [phone] between 2-3pm or after 4pm.

## 2017-04-04 NOTE — Telephone Encounter (Signed)
New message     Pt called back left his mobile number for you to call back there is something wrong with his office line .

## 2017-04-04 NOTE — Telephone Encounter (Signed)
Patient states that he has been having episodes of sweating that have been going on for several weeks that have gotten worse this past week. He states that he just starts sweating excessively and becomes SOB, especially when he is in the heat. Patient states that this can happen at rest or with activity. Patient states that he does get a little lightheaded when he turns too quick. Patient denies having any chest pain, pain in his neck, arm or jaw pain, or any other symptoms. Patient states that he has been drinking plenty of fluids and staying hydrated. Patient did not have any vitals to offer. Patient is taking meds as directed. Patient states that he has not had that feeling in his throat like he had before, but he feels like something is going on. He states that he is planning on going out of town this weekend and did not know if he should be seen. Patient made aware that information would be sent to Dr. Eldridge DaceVaranasi for review and recommendation.

## 2017-04-04 NOTE — Telephone Encounter (Signed)
Attempted to call patient at [phone] as directed. Phone continuously rings busy.

## 2017-04-05 ENCOUNTER — Ambulatory Visit (INDEPENDENT_AMBULATORY_CARE_PROVIDER_SITE_OTHER): Payer: 59 | Admitting: Interventional Cardiology

## 2017-04-05 ENCOUNTER — Encounter: Payer: Self-pay | Admitting: Interventional Cardiology

## 2017-04-05 ENCOUNTER — Encounter: Payer: Self-pay | Admitting: *Deleted

## 2017-04-05 VITALS — BP 118/74 | HR 72 | Ht 68.0 in | Wt 206.8 lb

## 2017-04-05 DIAGNOSIS — Z01812 Encounter for preprocedural laboratory examination: Secondary | ICD-10-CM | POA: Diagnosis not present

## 2017-04-05 DIAGNOSIS — I10 Essential (primary) hypertension: Secondary | ICD-10-CM

## 2017-04-05 DIAGNOSIS — I25119 Atherosclerotic heart disease of native coronary artery with unspecified angina pectoris: Secondary | ICD-10-CM | POA: Diagnosis not present

## 2017-04-05 DIAGNOSIS — E1159 Type 2 diabetes mellitus with other circulatory complications: Secondary | ICD-10-CM | POA: Diagnosis not present

## 2017-04-05 DIAGNOSIS — R079 Chest pain, unspecified: Secondary | ICD-10-CM

## 2017-04-05 LAB — CBC
HEMATOCRIT: 40.3 % (ref 37.5–51.0)
HEMOGLOBIN: 13.9 g/dL (ref 13.0–17.7)
MCH: 28.4 pg (ref 26.6–33.0)
MCHC: 34.5 g/dL (ref 31.5–35.7)
MCV: 82 fL (ref 79–97)
Platelets: 297 10*3/uL (ref 150–379)
RBC: 4.9 x10E6/uL (ref 4.14–5.80)
RDW: 15.2 % (ref 12.3–15.4)
WBC: 7.5 10*3/uL (ref 3.4–10.8)

## 2017-04-05 LAB — BASIC METABOLIC PANEL
BUN / CREAT RATIO: 21 — AB (ref 9–20)
BUN: 21 mg/dL (ref 6–24)
CO2: 28 mmol/L (ref 20–29)
CREATININE: 0.98 mg/dL (ref 0.76–1.27)
Calcium: 10.1 mg/dL (ref 8.7–10.2)
Chloride: 101 mmol/L (ref 96–106)
GFR calc Af Amer: 100 mL/min/{1.73_m2} (ref >59)
GFR calc non Af Amer: 86 mL/min/{1.73_m2} (ref >59)
GLUCOSE: 185 mg/dL — AB (ref 65–99)
Potassium: 4.2 mmol/L (ref 3.5–5.2)
Sodium: 139 mmol/L (ref 134–144)

## 2017-04-05 LAB — PROTIME-INR
INR: 1 (ref 0.8–1.2)
Prothrombin Time: 10.3 s (ref 9.1–12.0)

## 2017-04-05 NOTE — Patient Instructions (Signed)
Medication Instructions:  None  Labwork: CBC, INR and BMET today  Testing/Procedures: Your physician has requested that you have a cardiac catheterization. Cardiac catheterization is used to diagnose and/or treat various heart conditions. Doctors may recommend this procedure for a number of different reasons. The most common reason is to evaluate chest pain. Chest pain can be a symptom of coronary artery disease (CAD), and cardiac catheterization can show whether plaque is narrowing or blocking your heart's arteries. This procedure is also used to evaluate the valves, as well as measure the blood flow and oxygen levels in different parts of your heart. For further information please visit [email or web address]. Please follow instruction sheet, as given.   Follow-Up: Your physician recommends that you schedule a follow-up appointment in 2 weeks with Dr. Eldridge DaceVaranasi or APP on his team.   Any Other Special Instructions Will Be Listed Below (If Applicable).     If you need a refill on your cardiac medications before your next appointment, please call your pharmacy.

## 2017-04-05 NOTE — Progress Notes (Signed)
Cardiology Office Note    Date:  04/05/2017   ID:  Troy Martinez, DOB 02-Mar-1961, MRN [id number]  PCP:  Street, Sharon Mt, MD  Cardiologist: Sinclair Grooms, MD   Chief Complaint  Patient presents with  . Shortness of Breath    History of Present Illness:  Troy Martinez is a 56 y.o. male who has had CAD Including drug-eluting stents to the circumflex and RCA in the past. Most recent intervention 122  56 year old gentleman with a 3 to four-week history of exertional diaphoresis and dyspnea was sent the weather has warmed up. 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 He now has significant limitations in exertional tolerance because of these complaints. Prior ischemic complaint was exertional shortness of breath and discomfort in the supra sternal not sure. He has not had the throat discomfort associated with this. He denies orthopnea, lower extremity swelling, leg pain, PND, and wheezing. No cough, chills, or fever.  Past Medical History:  Diagnosis Date  . Arthritis   . Coronary artery disease    a. DES to LCx (2008)  b. DES to RCA (2012) c. Overlapping DESx2 to large ramus (03/2014)  . GERD (gastroesophageal reflux disease)   . High cholesterol   . Hypertension   . Obesity   . Type II diabetes mellitus ([place])     Past Surgical History:  Procedure Laterality Date  . ANTERIOR CERVICAL DECOMP/DISCECTOMY FUSION  05/2005  . BACK SURGERY    . CARDIAC CATHETERIZATION  06/2007   . CORONARY ANGIOPLASTY WITH STENT PLACEMENT  06/2007 11/2010   DES in distal circumflex ([facility]) w normal LV function 11/2010 DES to RCA-Promus  . CORONARY ANGIOPLASTY WITH STENT PLACEMENT  04/04/2014   "2; got a total of 4 now"  . DECOMPRESSION FASCIOTOMY LEG Right 2003  . LEFT HEART CATHETERIZATION WITH CORONARY ANGIOGRAM N/A 04/04/2014   Procedure: LEFT HEART CATHETERIZATION WITH CORONARY ANGIOGRAM;  Surgeon: Jettie Booze, MD;  Location: [facility] CATH LAB;  Service:  Cardiovascular;  Laterality: N/A;  . LEG SURGERY Right    "cut scar tissue out"  . ORIF PROXIMAL TIBIAL PLATEAU FRACTURE Right 2003  . PERCUTANEOUS STENT INTERVENTION  04/04/2014   Procedure: PERCUTANEOUS STENT INTERVENTION;  Surgeon: Jettie Booze, MD;  Location: [facility] CATH LAB;  Service: Cardiovascular;;  DES x 2 Ramus 2.5x8, 2.5x32   . TIBIA HARDWARE REMOVAL Right ?2004    Current Medications: Outpatient Medications Prior to Visit  Medication Sig Dispense Refill  . acetaminophen (TYLENOL) 325 MG tablet Take 650 mg by mouth every 6 (six) hours as needed for mild pain, fever or headache.     . Ascorbic Acid (VITAMIN C) 500 MG CHEW Chew 250 mg by mouth daily.    Marland Kitchen atorvastatin (LIPITOR) 80 MG tablet Take 1 tablet (80 mg total) by mouth daily. 30 tablet 10  . buPROPion (WELLBUTRIN SR) 150 MG 12 hr tablet Take 150 mg by mouth 2 (two) times daily.  1  . Cyanocobalamin (B-12 COMPLIANCE INJECTION) 1000 MCG/ML KIT Inject as directed every 30 (thirty) days.     Marland Kitchen esomeprazole (NEXIUM) 20 MG capsule Take 20 mg by mouth daily at 12 noon.    Marland Kitchen Fe-Succ Ac-C-Thre Ac-B12-FA (FERREX 150 FORTE PLUS) 50-100 MG CAPS Take 1 capsule by mouth at bedtime. Take by mouth as directed    . glimepiride (AMARYL) 1 MG tablet Take 0.5 mg by mouth daily with breakfast.     . JANUMET XR  50-1000 MG TB24 Take 1 tablet by mouth 2 (two) times daily.  3  . lisinopril (PRINIVIL,ZESTRIL) 10 MG tablet Take 5 mg by mouth daily.  12  . nitroGLYCERIN (NITROSTAT) 0.4 MG SL tablet Place 0.4 mg under the tongue every 5 (five) minutes as needed for chest pain. 3 DOSES MAX    . ONE TOUCH ULTRA TEST test strip 1 each.     . prasugrel (EFFIENT) 10 MG TABS tablet TAKE 1 TABLET (10 MG TOTAL) BY MOUTH DAILY. 30 tablet 5  . BYSTOLIC 5 MG tablet TAKE 1 TABLET EVERY DAY (Patient not taking: Reported on 04/05/2017) 90 tablet 1  . escitalopram (LEXAPRO) 20 MG tablet Take 20 mg by mouth at bedtime.     . fenofibrate 160 MG tablet TAKE 1 TABLET  EVERY DAY (Patient not taking: Reported on 04/05/2017) 90 tablet 1  . gabapentin (NEURONTIN) 300 MG capsule Take 300 mg by mouth 3 (three) times daily.  2   No facility-administered medications prior to visit.      Allergies:   Patient has no known allergies.   Social History   Social History  . Marital status: Married    Spouse name: N/A  . Number of children: N/A  . Years of education: N/A   Social History Main Topics  . Smoking status: Former Smoker    Packs/day: 3.00    Years: 23.00    Types: Cigarettes    Quit date: 01/18/1999  . Smokeless tobacco: Never Used  . Alcohol use 8.4 oz/week    14 Shots of liquor per week     Comment: 04/04/2014 "2 shots/day on average"  . Drug use: Yes    Types: Marijuana     Comment: "quit marijuana in 1994"  . Sexual activity: Yes   Other Topics Concern  . None   Social History Narrative  . None     Family History:  The patient's family history includes Heart attack in his father, maternal grandmother, and paternal grandfather; Heart disease in his father; Hypertension in his father; Stroke in his mother.   ROS:   Please see the history of present illness.    He is diabetic. He has history of anxiety and depression. Difficulty urinating. He is bruising is led to discontinuation of aspirin. He is having excessive sweating and fatigue with exertion.  All other systems reviewed and are negative.   PHYSICAL EXAM:   VS:  BP 118/74 (BP Location: Right Arm)   Pulse 72   Ht '5\' 8"'$  (1.727 m)   Wt 206 lb 12.8 oz (93.8 kg)   BMI 31.44 kg/m    GEN: Well nourished, well developed, in no acute distress  HEENT: normal  Neck: no JVD, carotid bruits, or masses Cardiac: RRR; no murmurs, rubs, or gallops,no edema  Respiratory:  clear to auscultation bilaterally, normal work of breathing GI: soft, nontender, nondistended, + BS MS: no deformity or atrophy . Upper and lower extremities reveal 2+ pulses without edema. Skin: warm and dry, no  rash Neuro:  Alert and Oriented x 3, Strength and sensation are intact Psych: euthymic mood, full affect  Wt Readings from Last 3 Encounters:  04/05/17 206 lb 12.8 oz (93.8 kg)  12/01/16 212 lb 6.4 oz (96.3 kg)  09/22/16 212 lb (96.2 kg)      Studies/Labs Reviewed:   EKG:  EKG  Normal sinus rhythm with normal appearance. No change from prior.  Recent Labs: 12/01/2016: ALT 25; BUN 20; Creatinine, Ser 0.92; Potassium  4.6; Sodium 142   Lipid Panel    Component Value Date/Time   CHOL 123 12/01/2016 0902   TRIG 192 (H) 12/01/2016 0902   HDL 24 (L) 12/01/2016 0902   CHOLHDL 5.1 (H) 12/01/2016 0902   LDLCALC 61 12/01/2016 0902    Additional studies/ records that were reviewed today include:  Last coronary angiogram was 2015: DES ramus intermedius implanted. Patent stents in the right coronary at that time.    ASSESSMENT:    1. Coronary artery disease involving native coronary artery of native heart with angina pectoris ([place])   2. Essential hypertension, benign   3. Type 2 diabetes mellitus with other circulatory complication, unspecified whether long term insulin use ([place])   4. Pre-procedure lab exam   5. Chest pain, unspecified type      PLAN:  In order of problems listed above:  1. Possible/probable anginal equivalent as denoted by exertional dyspnea and diaphoresis relatively new in onset over the past 6-8 weeks. Features are similar to prior angina. Have recommended that the patient undergo diagnostic cardiac catheterization with possible PCI. The procedure and risks have been discussed in detail. 2. Current control is excellent. 3. Diabetes control is reasonable on Janumet and glimepiride. We have asked the patient to hold Janumet until after cardiac catheterization.  The patient was counseled to undergo left heart catheterization, coronary angiography, and possible percutaneous coronary intervention with stent implantation. The procedural risks and benefits were  discussed in detail. The risks discussed included death, stroke, myocardial infarction, life-threatening bleeding, limb ischemia, kidney injury, allergy, and possible emergency cardiac surgery. The risk of these significant complications were estimated to occur less than 1% of the time. After discussion, the patient has agreed to proceed.  Medication Adjustments/Labs and Tests Ordered: Current medicines are reviewed at length with the patient today.  Concerns regarding medicines are outlined above.  Medication changes, Labs and Tests ordered today are listed in the Patient Instructions below. Patient Instructions  Medication Instructions:  None  Labwork: CBC, INR and BMET today  Testing/Procedures: Your physician has requested that you have a cardiac catheterization. Cardiac catheterization is used to diagnose and/or treat various heart conditions. Doctors may recommend this procedure for a number of different reasons. The most common reason is to evaluate chest pain. Chest pain can be a symptom of coronary artery disease (CAD), and cardiac catheterization can show whether plaque is narrowing or blocking your heart's arteries. This procedure is also used to evaluate the valves, as well as measure the blood flow and oxygen levels in different parts of your heart. For further information please visit [email or web address]. Please follow instruction sheet, as given.   Follow-Up: Your physician recommends that you schedule a follow-up appointment in 2 weeks with Dr. Irish Lack or APP on his team.   Any Other Special Instructions Will Be Listed Below (If Applicable).     If you need a refill on your cardiac medications before your next appointment, please call your pharmacy.      Signed, Sinclair Grooms, MD  04/05/2017 2:45 PM    [facility] [place], [place], [place]  [address] Phone: [phone]; Fax: [phone]

## 2017-04-06 ENCOUNTER — Encounter (HOSPITAL_COMMUNITY): Payer: Self-pay | Admitting: Physician Assistant

## 2017-04-06 ENCOUNTER — Ambulatory Visit (HOSPITAL_COMMUNITY)
Admission: RE | Admit: 2017-04-06 | Discharge: 2017-04-06 | Disposition: A | Discharge status: Home / Self Care | Payer: 59 | Source: Ambulatory Visit | Attending: Interventional Cardiology | Admitting: Interventional Cardiology

## 2017-04-06 ENCOUNTER — Encounter (HOSPITAL_COMMUNITY)
Admission: RE | Disposition: A | Discharge status: Home / Self Care | Payer: Self-pay | Source: Ambulatory Visit | Attending: Interventional Cardiology

## 2017-04-06 ENCOUNTER — Telehealth: Payer: Self-pay | Admitting: Physician Assistant

## 2017-04-06 ENCOUNTER — Other Ambulatory Visit: Payer: Self-pay

## 2017-04-06 DIAGNOSIS — E1159 Type 2 diabetes mellitus with other circulatory complications: Secondary | ICD-10-CM | POA: Diagnosis not present

## 2017-04-06 DIAGNOSIS — I10 Essential (primary) hypertension: Secondary | ICD-10-CM | POA: Diagnosis present

## 2017-04-06 DIAGNOSIS — E669 Obesity, unspecified: Secondary | ICD-10-CM | POA: Diagnosis not present

## 2017-04-06 DIAGNOSIS — Y831 Surgical operation with implant of artificial internal device as the cause of abnormal reaction of the patient, or of later complication, without mention of misadventure at the time of the procedure: Secondary | ICD-10-CM | POA: Diagnosis not present

## 2017-04-06 DIAGNOSIS — E119 Type 2 diabetes mellitus without complications: Secondary | ICD-10-CM

## 2017-04-06 DIAGNOSIS — Z7982 Long term (current) use of aspirin: Secondary | ICD-10-CM | POA: Insufficient documentation

## 2017-04-06 DIAGNOSIS — Z981 Arthrodesis status: Secondary | ICD-10-CM | POA: Insufficient documentation

## 2017-04-06 DIAGNOSIS — Z7984 Long term (current) use of oral hypoglycemic drugs: Secondary | ICD-10-CM | POA: Diagnosis not present

## 2017-04-06 DIAGNOSIS — I25119 Atherosclerotic heart disease of native coronary artery with unspecified angina pectoris: Secondary | ICD-10-CM | POA: Diagnosis not present

## 2017-04-06 DIAGNOSIS — E782 Mixed hyperlipidemia: Secondary | ICD-10-CM | POA: Diagnosis present

## 2017-04-06 DIAGNOSIS — T82855A Stenosis of coronary artery stent, initial encounter: Secondary | ICD-10-CM | POA: Diagnosis not present

## 2017-04-06 DIAGNOSIS — K219 Gastro-esophageal reflux disease without esophagitis: Secondary | ICD-10-CM | POA: Diagnosis not present

## 2017-04-06 DIAGNOSIS — Z87891 Personal history of nicotine dependence: Secondary | ICD-10-CM | POA: Insufficient documentation

## 2017-04-06 DIAGNOSIS — Z6831 Body mass index (BMI) 31.0-31.9, adult: Secondary | ICD-10-CM | POA: Insufficient documentation

## 2017-04-06 DIAGNOSIS — M1990 Unspecified osteoarthritis, unspecified site: Secondary | ICD-10-CM | POA: Insufficient documentation

## 2017-04-06 DIAGNOSIS — Z8249 Family history of ischemic heart disease and other diseases of the circulatory system: Secondary | ICD-10-CM | POA: Diagnosis not present

## 2017-04-06 DIAGNOSIS — Z955 Presence of coronary angioplasty implant and graft: Secondary | ICD-10-CM

## 2017-04-06 DIAGNOSIS — I209 Angina pectoris, unspecified: Secondary | ICD-10-CM | POA: Diagnosis present

## 2017-04-06 HISTORY — PX: LEFT HEART CATH AND CORONARY ANGIOGRAPHY: CATH118249

## 2017-04-06 HISTORY — PX: CORONARY BALLOON ANGIOPLASTY: CATH118233

## 2017-04-06 LAB — POCT ACTIVATED CLOTTING TIME: Activated Clotting Time: 274 seconds

## 2017-04-06 LAB — GLUCOSE, CAPILLARY
Glucose-Capillary: 123 mg/dL — ABNORMAL HIGH (ref 65–99)
Glucose-Capillary: 104 mg/dL — ABNORMAL HIGH (ref 65–99)

## 2017-04-06 SURGERY — LEFT HEART CATH AND CORONARY ANGIOGRAPHY
Anesthesia: LOCAL

## 2017-04-06 MED ORDER — VERAPAMIL HCL 2.5 MG/ML IV SOLN
INTRAVENOUS | Status: AC
  Filled 2017-04-06: qty 2

## 2017-04-06 MED ORDER — SODIUM CHLORIDE 0.9 % IV SOLN: INTRAVENOUS | Status: AC

## 2017-04-06 MED ORDER — SODIUM CHLORIDE 0.9 % WEIGHT BASED INFUSION
3.0000 mL/kg/h | INTRAVENOUS | Status: AC
  Administered 2017-04-06: 3 mL/kg/h via INTRAVENOUS

## 2017-04-06 MED ORDER — HEPARIN SODIUM (PORCINE) 1000 UNIT/ML IJ SOLN
INTRAMUSCULAR | Status: AC
  Filled 2017-04-06: qty 1

## 2017-04-06 MED ORDER — NITROGLYCERIN 1 MG/10 ML FOR IR/CATH LAB
INTRA_ARTERIAL | Status: AC
Start: 2017-04-06 — End: 2017-04-06
  Filled 2017-04-06: qty 10

## 2017-04-06 MED ORDER — SODIUM CHLORIDE 0.9% FLUSH: 3.0000 mL | Freq: Two times a day (BID) | INTRAVENOUS | Status: DC

## 2017-04-06 MED ORDER — FENTANYL CITRATE (PF) 100 MCG/2ML IJ SOLN
INTRAMUSCULAR | Status: AC
  Filled 2017-04-06: qty 2

## 2017-04-06 MED ORDER — ASPIRIN EC 81 MG PO TBEC: 81.0000 mg | DELAYED_RELEASE_TABLET | Freq: Every day | ORAL | 0 refills | Status: DC

## 2017-04-06 MED ORDER — MIDAZOLAM HCL 2 MG/2ML IJ SOLN
INTRAMUSCULAR | Status: AC
  Filled 2017-04-06: qty 2

## 2017-04-06 MED ORDER — ASPIRIN 81 MG PO CHEW
81.0000 mg | CHEWABLE_TABLET | ORAL | Status: AC
  Administered 2017-04-06: 81 mg via ORAL

## 2017-04-06 MED ORDER — VERAPAMIL HCL 2.5 MG/ML IV SOLN
INTRAVENOUS | Status: DC | PRN
  Administered 2017-04-06: 10 mL via INTRA_ARTERIAL

## 2017-04-06 MED ORDER — ACETAMINOPHEN 500 MG PO TABS: 1000.0000 mg | ORAL_TABLET | Freq: Every day | ORAL | Status: AC | PRN

## 2017-04-06 MED ORDER — SODIUM CHLORIDE 0.9 % IV SOLN: 250.0000 mL | INTRAVENOUS | Status: DC | PRN

## 2017-04-06 MED ORDER — MIDAZOLAM HCL 2 MG/2ML IJ SOLN
INTRAMUSCULAR | Status: DC | PRN
  Administered 2017-04-06: 1 mg via INTRAVENOUS
  Administered 2017-04-06: 2 mg via INTRAVENOUS

## 2017-04-06 MED ORDER — HEPARIN (PORCINE) IN NACL 2-0.9 UNIT/ML-% IJ SOLN
INTRAMUSCULAR | Status: AC | PRN
  Administered 2017-04-06: 1000 mL

## 2017-04-06 MED ORDER — FENTANYL CITRATE (PF) 100 MCG/2ML IJ SOLN
INTRAMUSCULAR | Status: DC | PRN
  Administered 2017-04-06 (×2): 25 ug via INTRAVENOUS

## 2017-04-06 MED ORDER — ACETAMINOPHEN 325 MG PO TABS: 650.0000 mg | ORAL_TABLET | ORAL | Status: DC | PRN

## 2017-04-06 MED ORDER — NITROGLYCERIN 1 MG/10 ML FOR IR/CATH LAB
INTRA_ARTERIAL | Status: DC | PRN
Start: 2017-04-06 — End: 2017-04-06
  Administered 2017-04-06: 200 ug via INTRACORONARY

## 2017-04-06 MED ORDER — LIDOCAINE HCL (PF) 1 % IJ SOLN
INTRAMUSCULAR | Status: DC | PRN
  Administered 2017-04-06: 1 mL

## 2017-04-06 MED ORDER — LIDOCAINE HCL (PF) 1 % IJ SOLN
INTRAMUSCULAR | Status: AC
  Filled 2017-04-06: qty 30

## 2017-04-06 MED ORDER — SODIUM CHLORIDE 0.9% FLUSH: 3.0000 mL | INTRAVENOUS | Status: DC | PRN

## 2017-04-06 MED ORDER — ASPIRIN 81 MG PO CHEW
CHEWABLE_TABLET | ORAL | Status: AC
  Administered 2017-04-06: 81 mg via ORAL
  Filled 2017-04-06: qty 1

## 2017-04-06 MED ORDER — IOPAMIDOL (ISOVUE-370) INJECTION 76%
INTRAVENOUS | Status: AC
  Filled 2017-04-06: qty 100

## 2017-04-06 MED ORDER — ONDANSETRON HCL 4 MG/2ML IJ SOLN: 4.0000 mg | Freq: Four times a day (QID) | INTRAMUSCULAR | Status: DC | PRN

## 2017-04-06 MED ORDER — SODIUM CHLORIDE 0.9 % WEIGHT BASED INFUSION: 1.0000 mL/kg/h | INTRAVENOUS | Status: DC

## 2017-04-06 MED ORDER — HEPARIN (PORCINE) IN NACL 2-0.9 UNIT/ML-% IJ SOLN
INTRAMUSCULAR | Status: AC
  Filled 2017-04-06: qty 1000

## 2017-04-06 MED ORDER — HEPARIN SODIUM (PORCINE) 1000 UNIT/ML IJ SOLN
INTRAMUSCULAR | Status: DC | PRN
  Administered 2017-04-06: 5000 [IU] via INTRAVENOUS
  Administered 2017-04-06: 2000 [IU] via INTRAVENOUS
  Administered 2017-04-06: 5000 [IU] via INTRAVENOUS

## 2017-04-06 MED ORDER — HYDRALAZINE HCL 20 MG/ML IJ SOLN: 5.0000 mg | INTRAMUSCULAR | Status: DC | PRN

## 2017-04-06 MED ORDER — LABETALOL HCL 5 MG/ML IV SOLN: 10.0000 mg | INTRAVENOUS | Status: DC | PRN

## 2017-04-06 SURGICAL SUPPLY — 15 items
BALLN WOLVERINE 3.00X10 (BALLOONS) ×3
BALLOON WOLVERINE 3.00X10 (BALLOONS) ×2 IMPLANT
CATH 5FR JL3.5 JR4 ANG PIG MP (CATHETERS) ×3 IMPLANT
CATH LAUNCHER 6FR EBU 3 (CATHETERS) ×3 IMPLANT
DEVICE RAD COMP TR BAND LRG (VASCULAR PRODUCTS) ×3 IMPLANT
GLIDESHEATH SLEND SS 6F .021 (SHEATH) ×3 IMPLANT
GUIDEWIRE INQWIRE 1.5J.035X260 (WIRE) ×2 IMPLANT
INQWIRE 1.5J .035X260CM (WIRE) ×3
KIT ENCORE 26 ADVANTAGE (KITS) ×3 IMPLANT
KIT HEART LEFT (KITS) ×3 IMPLANT
PACK CARDIAC CATHETERIZATION (CUSTOM PROCEDURE TRAY) ×3 IMPLANT
TRANSDUCER W/STOPCOCK (MISCELLANEOUS) ×3 IMPLANT
TUBING CIL FLEX 10 FLL-RA (TUBING) ×3 IMPLANT
VALVE GUARDIAN II ~~LOC~~ HEMO (MISCELLANEOUS) ×3 IMPLANT
WIRE ASAHI PROWATER 180CM (WIRE) ×3 IMPLANT

## 2017-04-06 NOTE — Telephone Encounter (Signed)
New message      TCM appt on 04-13-17 at 2pm with Ronie [facility]ayna Dunn [facility]

## 2017-04-06 NOTE — Progress Notes (Signed)
Pt is stable no chest pain or SOB, rt wrist cath site with dressing in place and 2+ pulses, no hematoma or bruising and warm hand.  Ok for discharge.

## 2017-04-06 NOTE — H&P (View-Only) (Signed)
Cardiology Office Note    Date:  04/05/2017   ID:  Troy Martinez, DOB 02-Mar-1961, MRN [id number]  PCP:  Street, Sharon Mt, MD  Cardiologist: Sinclair Grooms, MD   Chief Complaint  Patient presents with  . Shortness of Breath    History of Present Illness:  Troy Martinez is a 56 y.o. male who has had CAD Including drug-eluting stents to the circumflex and RCA in the past. Most recent intervention 122  56 year old gentleman with a 3 to four-week history of exertional diaphoresis and dyspnea was sent the weather has warmed up. 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 He now has significant limitations in exertional tolerance because of these complaints. Prior ischemic complaint was exertional shortness of breath and discomfort in the supra sternal not sure. He has not had the throat discomfort associated with this. He denies orthopnea, lower extremity swelling, leg pain, PND, and wheezing. No cough, chills, or fever.  Past Medical History:  Diagnosis Date  . Arthritis   . Coronary artery disease    a. DES to LCx (2008)  b. DES to RCA (2012) c. Overlapping DESx2 to large ramus (03/2014)  . GERD (gastroesophageal reflux disease)   . High cholesterol   . Hypertension   . Obesity   . Type II diabetes mellitus ([place])     Past Surgical History:  Procedure Laterality Date  . ANTERIOR CERVICAL DECOMP/DISCECTOMY FUSION  05/2005  . BACK SURGERY    . CARDIAC CATHETERIZATION  06/2007   . CORONARY ANGIOPLASTY WITH STENT PLACEMENT  06/2007 11/2010   DES in distal circumflex ([facility]) w normal LV function 11/2010 DES to RCA-Promus  . CORONARY ANGIOPLASTY WITH STENT PLACEMENT  04/04/2014   "2; got a total of 4 now"  . DECOMPRESSION FASCIOTOMY LEG Right 2003  . LEFT HEART CATHETERIZATION WITH CORONARY ANGIOGRAM N/A 04/04/2014   Procedure: LEFT HEART CATHETERIZATION WITH CORONARY ANGIOGRAM;  Surgeon: Jettie Booze, MD;  Location: [facility] CATH LAB;  Service:  Cardiovascular;  Laterality: N/A;  . LEG SURGERY Right    "cut scar tissue out"  . ORIF PROXIMAL TIBIAL PLATEAU FRACTURE Right 2003  . PERCUTANEOUS STENT INTERVENTION  04/04/2014   Procedure: PERCUTANEOUS STENT INTERVENTION;  Surgeon: Jettie Booze, MD;  Location: [facility] CATH LAB;  Service: Cardiovascular;;  DES x 2 Ramus 2.5x8, 2.5x32   . TIBIA HARDWARE REMOVAL Right ?2004    Current Medications: Outpatient Medications Prior to Visit  Medication Sig Dispense Refill  . acetaminophen (TYLENOL) 325 MG tablet Take 650 mg by mouth every 6 (six) hours as needed for mild pain, fever or headache.     . Ascorbic Acid (VITAMIN C) 500 MG CHEW Chew 250 mg by mouth daily.    Marland Kitchen atorvastatin (LIPITOR) 80 MG tablet Take 1 tablet (80 mg total) by mouth daily. 30 tablet 10  . buPROPion (WELLBUTRIN SR) 150 MG 12 hr tablet Take 150 mg by mouth 2 (two) times daily.  1  . Cyanocobalamin (B-12 COMPLIANCE INJECTION) 1000 MCG/ML KIT Inject as directed every 30 (thirty) days.     Marland Kitchen esomeprazole (NEXIUM) 20 MG capsule Take 20 mg by mouth daily at 12 noon.    Marland Kitchen Fe-Succ Ac-C-Thre Ac-B12-FA (FERREX 150 FORTE PLUS) 50-100 MG CAPS Take 1 capsule by mouth at bedtime. Take by mouth as directed    . glimepiride (AMARYL) 1 MG tablet Take 0.5 mg by mouth daily with breakfast.     . JANUMET XR  50-1000 MG TB24 Take 1 tablet by mouth 2 (two) times daily.  3  . lisinopril (PRINIVIL,ZESTRIL) 10 MG tablet Take 5 mg by mouth daily.  12  . nitroGLYCERIN (NITROSTAT) 0.4 MG SL tablet Place 0.4 mg under the tongue every 5 (five) minutes as needed for chest pain. 3 DOSES MAX    . ONE TOUCH ULTRA TEST test strip 1 each.     . prasugrel (EFFIENT) 10 MG TABS tablet TAKE 1 TABLET (10 MG TOTAL) BY MOUTH DAILY. 30 tablet 5  . BYSTOLIC 5 MG tablet TAKE 1 TABLET EVERY DAY (Patient not taking: Reported on 04/05/2017) 90 tablet 1  . escitalopram (LEXAPRO) 20 MG tablet Take 20 mg by mouth at bedtime.     . fenofibrate 160 MG tablet TAKE 1 TABLET  EVERY DAY (Patient not taking: Reported on 04/05/2017) 90 tablet 1  . gabapentin (NEURONTIN) 300 MG capsule Take 300 mg by mouth 3 (three) times daily.  2   No facility-administered medications prior to visit.      Allergies:   Patient has no known allergies.   Social History   Social History  . Marital status: Married    Spouse name: N/A  . Number of children: N/A  . Years of education: N/A   Social History Main Topics  . Smoking status: Former Smoker    Packs/day: 3.00    Years: 23.00    Types: Cigarettes    Quit date: 01/18/1999  . Smokeless tobacco: Never Used  . Alcohol use 8.4 oz/week    14 Shots of liquor per week     Comment: 04/04/2014 "2 shots/day on average"  . Drug use: Yes    Types: Marijuana     Comment: "quit marijuana in 1994"  . Sexual activity: Yes   Other Topics Concern  . None   Social History Narrative  . None     Family History:  The patient's family history includes Heart attack in his father, maternal grandmother, and paternal grandfather; Heart disease in his father; Hypertension in his father; Stroke in his mother.   ROS:   Please see the history of present illness.    He is diabetic. He has history of anxiety and depression. Difficulty urinating. He is bruising is led to discontinuation of aspirin. He is having excessive sweating and fatigue with exertion.  All other systems reviewed and are negative.   PHYSICAL EXAM:   VS:  BP 118/74 (BP Location: Right Arm)   Pulse 72   Ht '5\' 8"'$  (1.727 m)   Wt 206 lb 12.8 oz (93.8 kg)   BMI 31.44 kg/m    GEN: Well nourished, well developed, in no acute distress  HEENT: normal  Neck: no JVD, carotid bruits, or masses Cardiac: RRR; no murmurs, rubs, or gallops,no edema  Respiratory:  clear to auscultation bilaterally, normal work of breathing GI: soft, nontender, nondistended, + BS MS: no deformity or atrophy . Upper and lower extremities reveal 2+ pulses without edema. Skin: warm and dry, no  rash Neuro:  Alert and Oriented x 3, Strength and sensation are intact Psych: euthymic mood, full affect  Wt Readings from Last 3 Encounters:  04/05/17 206 lb 12.8 oz (93.8 kg)  12/01/16 212 lb 6.4 oz (96.3 kg)  09/22/16 212 lb (96.2 kg)      Studies/Labs Reviewed:   EKG:  EKG  Normal sinus rhythm with normal appearance. No change from prior.  Recent Labs: 12/01/2016: ALT 25; BUN 20; Creatinine, Ser 0.92; Potassium  4.6; Sodium 142   Lipid Panel    Component Value Date/Time   CHOL 123 12/01/2016 0902   TRIG 192 (H) 12/01/2016 0902   HDL 24 (L) 12/01/2016 0902   CHOLHDL 5.1 (H) 12/01/2016 0902   LDLCALC 61 12/01/2016 0902    Additional studies/ records that were reviewed today include:  Last coronary angiogram was 2015: DES ramus intermedius implanted. Patent stents in the right coronary at that time.    ASSESSMENT:    1. Coronary artery disease involving native coronary artery of native heart with angina pectoris ([place])   2. Essential hypertension, benign   3. Type 2 diabetes mellitus with other circulatory complication, unspecified whether long term insulin use ([place])   4. Pre-procedure lab exam   5. Chest pain, unspecified type      PLAN:  In order of problems listed above:  1. Possible/probable anginal equivalent as denoted by exertional dyspnea and diaphoresis relatively new in onset over the past 6-8 weeks. Features are similar to prior angina. Have recommended that the patient undergo diagnostic cardiac catheterization with possible PCI. The procedure and risks have been discussed in detail. 2. Current control is excellent. 3. Diabetes control is reasonable on Janumet and glimepiride. We have asked the patient to hold Janumet until after cardiac catheterization.  The patient was counseled to undergo left heart catheterization, coronary angiography, and possible percutaneous coronary intervention with stent implantation. The procedural risks and benefits were  discussed in detail. The risks discussed included death, stroke, myocardial infarction, life-threatening bleeding, limb ischemia, kidney injury, allergy, and possible emergency cardiac surgery. The risk of these significant complications were estimated to occur less than 1% of the time. After discussion, the patient has agreed to proceed.  Medication Adjustments/Labs and Tests Ordered: Current medicines are reviewed at length with the patient today.  Concerns regarding medicines are outlined above.  Medication changes, Labs and Tests ordered today are listed in the Patient Instructions below. Patient Instructions  Medication Instructions:  None  Labwork: CBC, INR and BMET today  Testing/Procedures: Your physician has requested that you have a cardiac catheterization. Cardiac catheterization is used to diagnose and/or treat various heart conditions. Doctors may recommend this procedure for a number of different reasons. The most common reason is to evaluate chest pain. Chest pain can be a symptom of coronary artery disease (CAD), and cardiac catheterization can show whether plaque is narrowing or blocking your heart's arteries. This procedure is also used to evaluate the valves, as well as measure the blood flow and oxygen levels in different parts of your heart. For further information please visit [email or web address]. Please follow instruction sheet, as given.   Follow-Up: Your physician recommends that you schedule a follow-up appointment in 2 weeks with Dr. Irish Lack or APP on his team.   Any Other Special Instructions Will Be Listed Below (If Applicable).     If you need a refill on your cardiac medications before your next appointment, please call your pharmacy.      Signed, Sinclair Grooms, MD  04/05/2017 2:45 PM    [facility] [place], [place], [place]  [address] Phone: [phone]; Fax: [phone]

## 2017-04-06 NOTE — Research (Signed)
OPTIMIZE Informed Consent   Subject Name: CALOB BASKETTE  Subject met inclusion and exclusion criteria.  The informed consent form, study requirements and expectations were reviewed with the subject and questions and concerns were addressed prior to the signing of the consent form.  The subject verbalized understanding of the trail requirements.  The subject agreed to participate in the OPTIMIZE trial and signed the informed consent.  The informed consent was obtained prior to performance of any protocol-specific procedures for the subject.  A copy of the signed informed consent was given to the subject and a copy was placed in the subject's medical record. Randomized if applicable.   Philemon Kingdom D 04/06/2017, 1245 pm

## 2017-04-06 NOTE — Interval H&P Note (Signed)
Cath Lab Visit (complete for each Cath Lab visit)  Clinical Evaluation Leading to the Procedure:   ACS: No.  Non-ACS:    Anginal Classification: CCS III  Anti-ischemic medical therapy: Minimal Therapy (1 class of medications)  Non-Invasive Test Results: No non-invasive testing performed  Prior CABG: No previous CABG      History and Physical Interval Note:  04/06/2017 1:28 PM  Troy Martinez  has presented today for surgery, with the diagnosis of cad,sob  The various methods of treatment have been discussed with the patient and family. After consideration of risks, benefits and other options for treatment, the patient has consented to  Procedure(s): Left Heart Cath and Coronary Angiography (N/A) as a surgical intervention .  The patient's history has been reviewed, patient examined, no change in status, stable for surgery.  I have reviewed the patient's chart and labs.  Questions were answered to the patient's satisfaction.     Lance MussJayadeep Varanasi

## 2017-04-06 NOTE — Discharge Summary (Signed)
Discharge Summary    Patient ID: Troy Martinez,  MRN: [id number], DOB/AGE: [birth year] 56 y.o.  Admit date: 04/06/2017 Discharge date: 04/06/2017  Primary Care Provider: Emmaline Kluver Primary Cardiologist: Dr. Tamala Julian  Discharge Diagnoses    Principal Problem:   Angina pectoris [facility]) Active Problems:   Mixed hyperlipidemia   Essential hypertension, benign   Coronary artery disease involving native coronary artery of native heart with angina pectoris ([place])   Type II diabetes mellitus ([place])  Diagnostic Studies/Procedures    1. Cardiac catheterization this admission, please see full report and below for summary. _____________     History of Present Illness     Troy Martinez is a 56 y.o. male with history of CAD s/p prior DES to LCX 2008, DES to RCA 2012, DESx2 to ramus 03/2014, GERD, HTN, HLD, obesity, arthritis, DM2 who presented to [facility] today for planned cath. He has a 3-4 week history of exertional diaphoresis and dyspnea and significantly limited exercise tolerance. Symptoms were similar to prior angina. He was on Effient prior to procedure. Outpatient LHC was scheduled.    Hospital Course    He presented for this procedure today with heart cath showing:  Patent RCA stent.  Patent circuflex stent.  Mid LAD lesion, 25 %stenosed.  Ramus-2 lesion, 70 %stenosed, focal area of instent restenosis. The was treated with a 3.0 Wolverine cutting balloon, inflated to 3.25 mm.  Post intervention, there is a 0% residual stenosis.  The left ventricular systolic function is normal.  LV end diastolic pressure is normal.  The left ventricular ejection fraction is 55-65% by visual estimate.  There is no aortic valve stenosis.  Dist Cx lesion, 75 %stenosed. Unchanged from prior in small vessel.  Dr. Irish Lack recommended the readdition of aspirin for at least 1 month. The patient reported he already had 79m available at home and did not require med to bed delivery. It  was recommended to continue Effient. As this was a same day PCI discharge, close f/u was arranged within 1 week on 04/13/17. The patient was observed post-procedure and did not have any observed complications. He will be assessed by our call APP prior to discharge home later this evening, at least 6 hours after TR band application. Dr. VIrish Lackhas seen and examined the patient today and feels he is stable for discharge pending this final assessment. I've signed out to the on-call APP. Also seen by cardiac rehab team and referred to CR phase II. Patietn was asked to hold his Metformin/Janumet for at least 48 hours post-cath - since cath was mid-day today, for simplicity, advised that he may resume on 03/10/17 AM. His OP tylenol dose was also listed as 1000-15049mper day - instructed that 150046ms above the recommended limit per dose, so we adjusted this back down to 1000m65m_____________  Discharge Vitals Blood pressure 105/60, pulse 61, temperature 98.6 F (37 C), temperature source Oral, resp. rate 19, height '5\' 8"'  (1.727 m), weight 206 lb (93.4 kg), SpO2 97 %.  Filed Weights   04/06/17 1145  Weight: 206 lb (93.4 kg)    Labs & Radiologic Studies    CBC  Recent Labs  04/05/17 1502  WBC 7.5  HGB 13.9  HCT 40.3  MCV 82  PLT 297 481asic Metabolic Panel  Recent Labs  04/05/17 1502  NA 139  K 4.2  CL 101  CO2 28  GLUCOSE 185*  BUN 21  CREATININE 0.98  CALCIUM 10.1  _____________  No results found. Disposition   Pt is being discharged home today in good condition.  Follow-up Plans & Appointments    Follow-up Information    Charlie Pitter, PA-C Follow up.   Specialties:  Cardiology, Radiology Why:  [place] - 04/13/17 at 2pm. Please arrive 15 minutes early to get checked in. This will take the place of your originally scheduled August appointment (had to be moved up due to stent). Contact information: [address] [place] [address] [place]  [address] [phone]          Discharge Instructions    Amb Referral to Cardiac Rehabilitation    Complete by:  As directed    Diagnosis:  Coronary Stents   Diet - low sodium heart healthy    Complete by:  As directed    Increase activity slowly    Complete by:  As directed    No driving for 3 days. No lifting over 5 lbs for 1 week. You may return to work in 3 days as long as you are feeling well, with the lifting restriction in place for a week. No sexual activity for 1 week. Keep procedure site clean & dry. If you notice increased pain, swelling, bleeding or pus, call/return!  You may shower, but no soaking baths/hot tubs/pools for 1 week.  You must stop your Janumet (medicine that contains metformin) for at least 48 hours after a heart catheterization. You may resume the morning of 04/09/17.  Your dose of Tylenol was previously listed as 1,000-1,500 mg per dose. 1,519m is above the recommended limit per dose, so we adjusted this back down to 1,0064m The maximum recommended dose is 1,00044mvery 6 hours.      Discharge Medications   Allergies as of 04/06/2017   No Known Allergies     Medication List    TAKE these medications   acetaminophen 500 MG tablet Commonly known as:  TYLENOL Take 2 tablets (1,000 mg total) by mouth daily as needed for moderate pain or headache. What changed:  how much to take   APPLE CIDER VINEGAR PO Take 30 mLs by mouth at bedtime.   aspirin EC 81 MG tablet Take 1 tablet (81 mg total) by mouth daily.   atorvastatin 80 MG tablet Commonly known as:  LIPITOR Take 1 tablet (80 mg total) by mouth daily. What changed:  when to take this   B-12 COMPLIANCE INJECTION 1000 MCG/ML Kit Generic drug:  Cyanocobalamin Inject as directed every 30 (thirty) days.   buPROPion 150 MG 12 hr tablet Commonly known as:  WELLBUTRIN SR Take 150 mg by mouth 2 (two) times daily.   BYSTOLIC 5 MG tablet Generic drug:  nebivolol Take 5 mg by mouth every evening.    esomeprazole 20 MG capsule Commonly known as:  NEXIUM Take 20 mg by mouth daily at 12 noon.   fenofibrate 160 MG tablet Take 160 mg by mouth every evening.   FERREX 150 FORTE PLUS 50-100 MG Caps Take 1 capsule by mouth every evening.   gabapentin 300 MG capsule Commonly known as:  NEURONTIN Take 1 capsule (300 mg) by mouth each morning and 2 capsule (600 mg) by mouth each evening.   glimepiride 1 MG tablet Commonly known as:  AMARYL Take 0.5 mg by mouth daily with breakfast.   hydrocortisone cream 1 % Apply 1 application topically daily as needed for itching.   JANUMET XR 50-1000 MG Tb24 Generic drug:  SitaGLIPtin-MetFORMIN HCl  Take 1 tablet by mouth 2 (two) times daily.   lisinopril 10 MG tablet Commonly known as:  PRINIVIL,ZESTRIL Take 5 mg by mouth daily.   MULTIVITAMIN PO Take 1 tablet by mouth daily.   nitroGLYCERIN 0.4 MG SL tablet Commonly known as:  NITROSTAT Place 0.4 mg under the tongue every 5 (five) minutes as needed for chest pain. 3 DOSES MAX   ONE TOUCH ULTRA TEST test strip Generic drug:  glucose blood 1 each.   prasugrel 10 MG Tabs tablet Commonly known as:  EFFIENT TAKE 1 TABLET (10 MG TOTAL) BY MOUTH DAILY.   venlafaxine XR 75 MG 24 hr capsule Commonly known as:  EFFEXOR-XR Take 75 mg by mouth every evening.   Vitamin C 500 MG Chew Chew 250 mg by mouth daily.        Allergies:  No Known Allergies   Outstanding Labs/Studies   n/a  Duration of Discharge Encounter   Greater than 30 minutes including physician time.  Signed, Nedra Hai Dunn PA-C 04/06/2017, 5:28 PM   I have examined the patient and reviewed assessment and plan and discussed with patient.  Agree with above as stated.  S/p cutting balloon angioplasty to the ramus due to angina.  Same day PCI.  Radial artery precautions given.  Already on Effient.  Restart aspirin for at least a month.  Continue aggressive secondary prevention.   Larae Grooms

## 2017-04-06 NOTE — Discharge Instructions (Signed)

## 2017-04-06 NOTE — Progress Notes (Signed)
[id number] Education completed with pt and wife who voiced understanding. Stressed importance of effient. Reviewed NTG use, risk factors, ex ed and watching carbs and heart healthy eating. Has done CRP 2 twice here in [place]. [facility] [place] in [employer]. Luetta NuttingCharlene Zyanne Schumm RN BSN 04/06/2017 3:13 PM

## 2017-04-07 ENCOUNTER — Telehealth: Payer: Self-pay

## 2017-04-07 ENCOUNTER — Telehealth (HOSPITAL_COMMUNITY): Payer: Self-pay

## 2017-04-07 ENCOUNTER — Encounter (HOSPITAL_COMMUNITY): Payer: Self-pay | Admitting: Interventional Cardiology

## 2017-04-07 NOTE — Telephone Encounter (Signed)
[facility]-Pt is same day PCI discharge.  Left detailed message per DPR.  Notified Pt of follow up visit with Dayna Dunn 04/13/2017 @ [facility]  Pt continues on Effient post cath.  Dr. Eldridge DaceVaranasi requests Pt take ASA 81 mg daily for at least one month.  No other med changes made.  Requested Pt return this nurse call if any questions or concerns.  Name and # left for call back.

## 2017-04-07 NOTE — Telephone Encounter (Signed)
Patient insurance is active and benefits verified. Patient insurance is [employer] - no co-payment, deductible $750/$236.74 has been met, out of pocket $3000/$996.09, 20% co-insurance, no pre-authorization and no limit on visit. Passport/reference [id number].

## 2017-04-10 NOTE — Telephone Encounter (Signed)
2nd outreach made to Pt.  Call went to VM.  Detailed message already left on Pt VM per DPR.  Await call back.

## 2017-04-13 ENCOUNTER — Ambulatory Visit (INDEPENDENT_AMBULATORY_CARE_PROVIDER_SITE_OTHER): Payer: 59 | Admitting: Physician Assistant

## 2017-04-13 ENCOUNTER — Encounter: Payer: Self-pay | Admitting: Physician Assistant

## 2017-04-13 ENCOUNTER — Telehealth (HOSPITAL_COMMUNITY): Payer: Self-pay

## 2017-04-13 VITALS — BP 124/62 | HR 65 | Ht 68.0 in | Wt 204.0 lb

## 2017-04-13 DIAGNOSIS — I2510 Atherosclerotic heart disease of native coronary artery without angina pectoris: Secondary | ICD-10-CM | POA: Diagnosis not present

## 2017-04-13 DIAGNOSIS — R42 Dizziness and giddiness: Secondary | ICD-10-CM

## 2017-04-13 DIAGNOSIS — R0602 Shortness of breath: Secondary | ICD-10-CM

## 2017-04-13 DIAGNOSIS — I10 Essential (primary) hypertension: Secondary | ICD-10-CM | POA: Diagnosis not present

## 2017-04-13 DIAGNOSIS — E785 Hyperlipidemia, unspecified: Secondary | ICD-10-CM

## 2017-04-13 NOTE — Progress Notes (Addendum)
Cardiology Office Note    Date:  04/13/2017  ID:  KARINA LENDERMAN, DOB Oct 17, 1960, MRN [id number] PCP:  Emmaline Kluver, MD  Cardiologist:  Dr. Irish Lack   Chief Complaint: f/u cath  History of Present Illness:  Troy Martinez is a 56 y.o. male with history of CAD (s/p prior DES to LCX 2008, DES to RCA 2012, DESx2 to ramus 03/2014, recent PTCA to ramus 2 03/2017), GERD, HTN, HLD, obesity, arthritis, DM2 who presents for post-cath follow-up. He was recently evaluated for 3-4 week history of exertional diaphoresis and dyspnea and significantly limited exercise tolerance. Symptoms were similar to prior angina. He was on Effient prior to procedure. Outpatient LHC was scheduled showing: 1. Patent RCA stent. 2. Patent circuflex stent. 3. Mid LAD lesion, 25 %stenosed. 4. Ramus-2 lesion, 70 %stenosed, focal area of instent restenosis. The was treated with a 3.0 Wolverine cutting balloon, inflated to 3.25 mm. 5. Post intervention, there is a 0% residual stenosis. 6. The left ventricular systolic function is normal. 7. LV end diastolic pressure is normal. 8. The left ventricular ejection fraction is 55-65% by visual estimate. 9. There is no aortic valve stenosis. 10. Dist Cx lesion, 75 %stenosed. Unchanged from prior in small vessel.  Dr. Irish Lack recommended the readdition of aspirin for at least 1 month (was on Effient PTA). Pre-cath labs had shown Cr 0.98, K 4.2, Hgb 13.9. Last lipids in 11/2016 showed normal LFTs, LDL 61.  He presents back for clinic overall feeling well. He did want to discuss 2 issues over the last week. 1) For about 2 days straight he had episodes of intermittent dizziness whenever he would look up or turn his head quickly. He does have history of eustachian tube issues in the past. This was not really orthostatic in nature. No visual changes, facial asymmetry, slurred speech, focal weakness or any other symptoms. He stopped his Bystolic for 2 days to see if it would help.  Did not really make a difference so he restarted. Dizziness has resolved on its own. 2) Shortness of breath - yesterday he was outside in the hot weather (in the shade) doing some packing when he developed shortness of breath that lasted at random for about 10 minutes. No palpitations or chest pain. This resolved without intervention.  He feels fine today. No dizziness, CP. Ambulated up to clinic and out and about today without any shortness of breath.    Past Medical History:  Diagnosis Date  . Arthritis   . Coronary artery disease    a. DES to LCx (2008)  b. DES to RCA (2012) c. Overlapping DESx2 to large ramus (03/2014). d. [place] 03/2017 s/p cutting ballon angioplasty to ramus 2.  . GERD (gastroesophageal reflux disease)   . High cholesterol   . Hypertension   . Obesity   . Type II diabetes mellitus ([place])     Past Surgical History:  Procedure Laterality Date  . ANTERIOR CERVICAL DECOMP/DISCECTOMY FUSION  05/2005  . BACK SURGERY    . CARDIAC CATHETERIZATION  06/2007   . CORONARY ANGIOPLASTY WITH STENT PLACEMENT  06/2007 11/2010   DES in distal circumflex ([place]) w normal LV function 11/2010 DES to RCA-Promus  . CORONARY ANGIOPLASTY WITH STENT PLACEMENT  04/04/2014   "2; got a total of 4 now"  . CORONARY BALLOON ANGIOPLASTY Left 04/06/2017   Procedure: Coronary Balloon Angioplasty;  Surgeon: Jettie Booze, MD;  Location: [place] [facility];  Service: Cardiovascular;  Laterality: Left;  Cutting balloon  to the Mid  Ramus  . DECOMPRESSION FASCIOTOMY LEG Right 2003  . LEFT HEART CATH AND CORONARY ANGIOGRAPHY N/A 04/06/2017   Procedure: Left Heart Cath and Coronary Angiography;  Surgeon: Jettie Booze, MD;  Location: [place] [facility];  Service: Cardiovascular;  Laterality: N/A;  . LEFT HEART CATHETERIZATION WITH CORONARY ANGIOGRAM N/A 04/04/2014   Procedure: LEFT HEART CATHETERIZATION WITH CORONARY ANGIOGRAM;  Surgeon: Jettie Booze, MD;  Location: [facility] CATH LAB;  Service:  Cardiovascular;  Laterality: N/A;  . LEG SURGERY Right    "cut scar tissue out"  . ORIF PROXIMAL TIBIAL PLATEAU FRACTURE Right 2003  . PERCUTANEOUS STENT INTERVENTION  04/04/2014   Procedure: PERCUTANEOUS STENT INTERVENTION;  Surgeon: Jettie Booze, MD;  Location: [facility] CATH LAB;  Service: Cardiovascular;;  DES x 2 Ramus 2.5x8, 2.5x32   . TIBIA HARDWARE REMOVAL Right ?2004    Current Medications: Current Meds  Medication Sig  . acetaminophen (TYLENOL) 500 MG tablet Take 2 tablets (1,000 mg total) by mouth daily as needed for moderate pain or headache.  . APPLE CIDER VINEGAR PO Take 30 mLs by mouth at bedtime.  . Ascorbic Acid (VITAMIN C) 500 MG CHEW Chew 250 mg by mouth daily.  Marland Kitchen aspirin EC 81 MG tablet Take 1 tablet (81 mg total) by mouth daily.  Marland Kitchen atorvastatin (LIPITOR) 80 MG tablet Take 80 mg by mouth at bedtime.  Marland Kitchen buPROPion (WELLBUTRIN SR) 150 MG 12 hr tablet Take 150 mg by mouth 2 (two) times daily.  . Cyanocobalamin (B-12 COMPLIANCE INJECTION) 1000 MCG/ML KIT Inject as directed every 30 (thirty) days.   Marland Kitchen esomeprazole (NEXIUM) 20 MG capsule Take 20 mg by mouth daily at 12 noon.  Marland Kitchen Fe-Succ Ac-C-Thre Ac-B12-FA (FERREX 150 FORTE PLUS) 50-100 MG CAPS Take 1 capsule by mouth every evening.   . fenofibrate 160 MG tablet Take 160 mg by mouth every evening.   . gabapentin (NEURONTIN) 300 MG capsule Take 1 capsule (300 mg) by mouth each morning and 2 capsule (600 mg) by mouth each evening.  Marland Kitchen glimepiride (AMARYL) 1 MG tablet Take 0.5 mg by mouth daily with breakfast.   . hydrocortisone cream 1 % Apply 1 application topically daily as needed for itching.  Marland Kitchen JANUMET XR 50-1000 MG TB24 Take 1 tablet by mouth 2 (two) times daily.   Marland Kitchen lisinopril (PRINIVIL,ZESTRIL) 10 MG tablet Take 5 mg by mouth daily.  . Multiple Vitamins-Minerals (MULTIVITAMIN PO) Take 1 tablet by mouth daily.  . nebivolol (BYSTOLIC) 5 MG tablet Take 5 mg by mouth every evening.   . nitroGLYCERIN (NITROSTAT) 0.4 MG SL  tablet Place 0.4 mg under the tongue every 5 (five) minutes as needed for chest pain. 3 DOSES MAX  . ONE TOUCH ULTRA TEST test strip 1 each.   . prasugrel (EFFIENT) 10 MG TABS tablet TAKE 1 TABLET (10 MG TOTAL) BY MOUTH DAILY.  Marland Kitchen venlafaxine XR (EFFEXOR-XR) 75 MG 24 hr capsule Take 75 mg by mouth every evening.      Allergies:   Patient has no known allergies.   Social History   Social History  . Marital status: Married    Spouse name: N/A  . Number of children: N/A  . Years of education: N/A   Social History Main Topics  . Smoking status: Former Smoker    Packs/day: 3.00    Years: 23.00    Types: Cigarettes    Quit date: 01/18/1999  . Smokeless tobacco: Never Used  . Alcohol use 8.4 oz/week  14 Shots of liquor per week     Comment: 04/04/2014 "2 shots/day on average"  . Drug use: Yes    Types: Marijuana     Comment: "quit marijuana in 1994"  . Sexual activity: Yes   Other Topics Concern  . None   Social History Narrative  . None     Family History:  Family History  Problem Relation Age of Onset  . Stroke Mother   . Heart disease Father   . Heart attack Father   . Hypertension Father   . Heart attack Maternal Grandmother   . Heart attack Paternal Grandfather     ROS:   Please see the history of present illness.  All other systems are reviewed and otherwise negative.    PHYSICAL EXAM:   VS:  BP 124/62   Pulse 65   Ht _0  (1.727 m)   Wt 204 lb (92.5 kg)   SpO2 96%   BMI 31.02 kg/m   BMI: Body mass index is 31.02 kg/m. GEN: Well nourished, well developed WM, in no acute distress  HEENT: normocephalic, atraumatic Neck: no JVD, carotid bruits, or masses Cardiac: RRR; no murmurs, rubs, or gallops, no edema  Respiratory:  clear to auscultation bilaterally, normal work of breathing GI: soft, nontender, nondistended, + BS MS: no deformity or atrophy  Skin: warm and dry, no rash. Right radial cath site without hematoma or ecchymosis; good pulse. Neuro:   Alert and Oriented x 3, Strength and sensation are intact, follows commands. Equal strength, no facial asymmetry, PERRLA, light touch and grip in tact, normal gait Psych: euthymic mood, full affect  Wt Readings from Last 3 Encounters:  04/13/17 204 lb (92.5 kg)  04/06/17 206 lb (93.4 kg)  04/05/17 206 lb 12.8 oz (93.8 kg)      Studies/Labs Reviewed:   EKG:  EKG was ordered today and personally reviewed by me and demonstrates NSR 64bpm with TWI III.   Recent Labs: 12/01/2016: ALT 25 04/05/2017: BUN 21; Creatinine, Ser 0.98; Hemoglobin 13.9; Platelets 297; Potassium 4.2; Sodium 139   Lipid Panel    Component Value Date/Time   CHOL 123 12/01/2016 0902   TRIG 192 (H) 12/01/2016 0902   HDL 24 (L) 12/01/2016 0902   CHOLHDL 5.1 (H) 12/01/2016 0902   LDLCALC 61 12/01/2016 0902    Additional studies/ records that were reviewed today include: Summarized above   ASSESSMENT & PLAN:   1. CAD - overall doing well. He did have one breakthrough episode of SOB yesterday but this was while working out in the heat and humidity. EKG does not show any acute changes today. He feels fine today without any functional limitation. I wonder if perhaps he could have become midlly dehydrated, eliciting symptoms. I have advised him to avoid being out in the heat for long and to continue present regimen and notify if any recurrent sx. I will send a copy of this note to Dr. Irish Lack to make him aware and also inquire about duration of aspirin. Per cath note it was recommended he continue this for at least 1 month. He remains on Effient. 2. Dizziness - sounds vertiginous in nature (associated with quick movements of the head, looking up and down), also with prior history of eustachian tube dysfunction. This resolved without intervention. EKG without arrhythmia today. Will continue to monitor. Could consider reducing BP medicines if symptoms recur. I also d/w Dr. Angelena Form (DOD) given recent cath. Patient is not  having any focal neurologic signs otherwise  and feels fine now. Would continue cautious surveillance for now. Check CBC to r/o any new anemia given addition of aspirin.  Per notes he does have history of rare breakthrough sx not felt to be cardiac in nature. 3. Essential HTN - see above. Continue to monitor. 4. Hyperlipidemia - continue statin.  Disposition: F/u with Dr. Irish Lack in 2 months.   Medication Adjustments/Labs and Tests Ordered: Current medicines are reviewed at length with the patient today.  Concerns regarding medicines are outlined above. Medication changes, Labs and Tests ordered today are summarized above and listed in the Patient Instructions accessible in Encounters.   Signed, Troy Pitter, PA-C  04/13/2017 2:11 PM    [facility] [place], [place], [place]  [address] Phone: [phone]; Fax: [phone]

## 2017-04-13 NOTE — Patient Instructions (Signed)
Medication Instructions:  Your physician recommends that you continue on your current medications as directed. Please refer to the Current Medication list given to you today.   Labwork: CBC today  Testing/Procedures: None ordered  Follow-Up: Your physician recommends that you schedule a follow-up appointment in: 2 months with Dr.Varanasi   Any Other Special Instructions Will Be Listed Below (If Applicable).     If you need a refill on your cardiac medications before your next appointment, please call your pharmacy.

## 2017-04-13 NOTE — Progress Notes (Signed)
Cardiac Rehab Medication Review by a Pharmacist  Does the patient  feel that his/her medications are working for him/her?  yes  Has the patient been experiencing any side effects to the medications prescribed?  no  Does the patient measure his/her own blood pressure or blood glucose at home?  yes - infrequently  Does the patient have any problems obtaining medications due to transportation or finances?   no  Understanding of regimen: good Understanding of indications: good Potential of compliance: good    Pharmacist comments: Spoke with patient about meds. He was aware of his medications and dosing regimen. No complaints now but will f/u with md soon.     Troy Martinez A Troy Martinez 04/13/2017 11:20 AM

## 2017-04-13 NOTE — Telephone Encounter (Signed)
Cardiac Rehab Medication Review by a Pharmacist  Does the patient  feel that his/her medications are working for him/her?  yes  Has the patient been experiencing any side effects to the medications prescribed?  Patient has been experiencing some lightheadness - instructed patient to bring it up to his doctor today (7/26) and at cardiac rehab if it persists  Does the patient measure his/her own blood pressure or blood glucose at home?  yes - not consistent  Does the patient have any problems obtaining medications due to transportation or finances?   no  Understanding of regimen: good Understanding of indications: good Potential of compliance: good    Pharmacist comments: Spoke with patient on the phone. Read most of meds from a list and verified how he takes each. I asked about vitamins, OTCs, and creams. Understanding seems good.    Troy Martinez A Troy Martinez 04/13/2017 11:59 AM

## 2017-04-14 ENCOUNTER — Telehealth: Payer: Self-pay | Admitting: Physician Assistant

## 2017-04-14 LAB — CBC WITH DIFFERENTIAL/PLATELET
Basophils Absolute: 0 10*3/uL (ref 0.0–0.2)
Basos: 1 %
EOS (ABSOLUTE): 0.3 10*3/uL (ref 0.0–0.4)
Eos: 5 %
HEMATOCRIT: 39.3 % (ref 37.5–51.0)
HEMOGLOBIN: 12.9 g/dL — AB (ref 13.0–17.7)
Immature Grans (Abs): 0 10*3/uL (ref 0.0–0.1)
Immature Granulocytes: 1 %
LYMPHS: 29 %
Lymphocytes Absolute: 1.7 10*3/uL (ref 0.7–3.1)
MCH: 27.7 pg (ref 26.6–33.0)
MCHC: 32.8 g/dL (ref 31.5–35.7)
MCV: 85 fL (ref 79–97)
MONOCYTES: 12 %
Monocytes Absolute: 0.7 10*3/uL (ref 0.1–0.9)
NEUTROS ABS: 3.2 10*3/uL (ref 1.4–7.0)
Neutrophils: 52 %
Platelets: 273 10*3/uL (ref 150–379)
RBC: 4.65 x10E6/uL (ref 4.14–5.80)
RDW: 14.7 % (ref 12.3–15.4)
WBC: 6.1 10*3/uL (ref 3.4–10.8)

## 2017-04-14 NOTE — Progress Notes (Signed)
One month of aspirin is fine.  We can stop bystolic and see how he does.  As long as he is not having throat pain, I think we are ok.  THaat is his classic anginal sx and he had that during balloon inflations during this last procedure.

## 2017-04-14 NOTE — Research (Signed)
OPTIMIZE Informed Consent   Subject Name: Troy Martinez  Subject met inclusion and exclusion criteria.  The informed consent form, study requirements and expectations were reviewed with the subject and questions and concerns were addressed prior to the signing of the consent form.  The subject verbalized understanding of the trail requirements.  The subject agreed to participate in the OPTIMIZE  trial and signed the informed consent.  The informed consent was obtained prior to performance of any protocol-specific procedures for the subject.  A copy of the signed informed consent was given to the subject and a copy was placed in the subject's medical record.  Philemon Kingdom D 04/14/2017, 1245 PM

## 2017-04-14 NOTE — Telephone Encounter (Signed)
S/w per Dr.Varanasi's recommendation's. Hold asa till august 19 and continue effient.  Pt is aware.

## 2017-04-14 NOTE — Telephone Encounter (Signed)
Please call Troy Martinez. I kept Dr. Eldridge DaceVaranasi informed of the recent symptoms he had, and also asked him how long he wanted Mr. Mcconaughey to continue the aspirin total. His reply:  "One month of aspirin is fine.  We can stop bystolic and see how he does.  As long as he is not having throat pain, I think we are ok.  Sharion DoveHaat is his classic anginal sx and he had that during balloon inflations during this last procedure."  Please relay that to him, and let him know he's OK to stop aspirin 1 month post-procedure (this was performed on 04/06/17) but continue Effient.  Thanks! Dayna Dunn PA-C

## 2017-04-17 ENCOUNTER — Telehealth (HOSPITAL_COMMUNITY): Payer: Self-pay

## 2017-04-17 NOTE — Telephone Encounter (Signed)
*  Updated insurance verification*  [employer] - no co-payment, deductible $750/$281.77 have been met, our of pocket $3000/$1063.43 has been met, 20% co-insurance, no pre-authorization and no limit. Passport/reference [phone].

## 2017-04-18 ENCOUNTER — Encounter (HOSPITAL_COMMUNITY)
Admission: RE | Admit: 2017-04-18 | Discharge: 2017-04-18 | Disposition: A | Discharge status: Home / Self Care | Payer: 59 | Source: Ambulatory Visit | Attending: Interventional Cardiology | Admitting: Interventional Cardiology

## 2017-04-18 ENCOUNTER — Encounter (HOSPITAL_COMMUNITY): Payer: Self-pay

## 2017-04-18 VITALS — BP 128/60 | HR 67 | Ht 68.0 in | Wt 205.5 lb

## 2017-04-18 DIAGNOSIS — Z955 Presence of coronary angioplasty implant and graft: Secondary | ICD-10-CM | POA: Diagnosis not present

## 2017-04-18 DIAGNOSIS — Z713 Dietary counseling and surveillance: Secondary | ICD-10-CM | POA: Insufficient documentation

## 2017-04-18 DIAGNOSIS — E7800 Pure hypercholesterolemia, unspecified: Secondary | ICD-10-CM | POA: Insufficient documentation

## 2017-04-18 DIAGNOSIS — K219 Gastro-esophageal reflux disease without esophagitis: Secondary | ICD-10-CM | POA: Diagnosis not present

## 2017-04-18 DIAGNOSIS — I2510 Atherosclerotic heart disease of native coronary artery without angina pectoris: Secondary | ICD-10-CM | POA: Diagnosis not present

## 2017-04-18 DIAGNOSIS — E119 Type 2 diabetes mellitus without complications: Secondary | ICD-10-CM | POA: Diagnosis not present

## 2017-04-18 DIAGNOSIS — I10 Essential (primary) hypertension: Secondary | ICD-10-CM | POA: Insufficient documentation

## 2017-04-18 DIAGNOSIS — E669 Obesity, unspecified: Secondary | ICD-10-CM | POA: Insufficient documentation

## 2017-04-18 DIAGNOSIS — M1990 Unspecified osteoarthritis, unspecified site: Secondary | ICD-10-CM | POA: Diagnosis not present

## 2017-04-18 NOTE — Telephone Encounter (Signed)
Clarified pt to take asa (81 mg ) daily and may stop asa on Aug 19 post procedure. Medication list updated. Lvm per DPR.

## 2017-04-18 NOTE — Progress Notes (Signed)
Cardiac Individual Treatment Plan  Patient Details  Name: Troy Martinez MRN: [id number] Date of Birth: [birth year] Referring Provider:     CARDIAC REHAB PHASE II ORIENTATION from 04/18/2017 in [facility]  Referring Provider  Casandra Doffing MD      Initial Encounter Date:    CARDIAC REHAB PHASE II ORIENTATION from 04/18/2017 in [facility]  Date  04/18/17  Referring Provider  Casandra Doffing MD      Visit Diagnosis: 04/06/17 Status post coronary artery stent placement  Patient's Home Medications on Admission:  Current Outpatient Prescriptions:  .  acetaminophen (TYLENOL) 500 MG tablet, Take 2 tablets (1,000 mg total) by mouth daily as needed for moderate pain or headache., Disp: , Rfl:  .  APPLE CIDER VINEGAR PO, Take 30 mLs by mouth at bedtime., Disp: , Rfl:  .  Ascorbic Acid (VITAMIN C) 500 MG CHEW, Chew 250 mg by mouth daily., Disp: , Rfl:  .  aspirin EC 81 MG tablet, Take 1 tablet (81 mg total) by mouth daily., Disp: 30 tablet, Rfl: 0 .  atorvastatin (LIPITOR) 80 MG tablet, Take 80 mg by mouth at bedtime., Disp: , Rfl:  .  buPROPion (WELLBUTRIN SR) 150 MG 12 hr tablet, Take 150 mg by mouth 2 (two) times daily., Disp: , Rfl: 1 .  Cyanocobalamin (B-12 COMPLIANCE INJECTION) 1000 MCG/ML KIT, Inject as directed every 30 (thirty) days. , Disp: , Rfl:  .  esomeprazole (NEXIUM) 20 MG capsule, Take 20 mg by mouth daily at 12 noon., Disp: , Rfl:  .  Fe-Succ Ac-C-Thre Ac-B12-FA (FERREX 150 FORTE PLUS) 50-100 MG CAPS, Take 1 capsule by mouth every evening. , Disp: , Rfl:  .  fenofibrate 160 MG tablet, Take 160 mg by mouth every evening. , Disp: , Rfl:  .  gabapentin (NEURONTIN) 300 MG capsule, Take 1 capsule (300 mg) by mouth each morning and 2 capsule (600 mg) by mouth each evening., Disp: , Rfl:  .  glimepiride (AMARYL) 1 MG tablet, Take 0.5 mg by mouth daily with breakfast. , Disp: , Rfl:  .  hydrocortisone cream 1 %, Apply 1  application topically daily as needed for itching., Disp: , Rfl:  .  JANUMET XR 50-1000 MG TB24, Take 1 tablet by mouth 2 (two) times daily. , Disp: , Rfl: 3 .  lisinopril (PRINIVIL,ZESTRIL) 10 MG tablet, Take 5 mg by mouth daily., Disp: , Rfl: 12 .  Multiple Vitamins-Minerals (MULTIVITAMIN PO), Take 1 tablet by mouth daily., Disp: , Rfl:  .  nebivolol (BYSTOLIC) 5 MG tablet, Take 5 mg by mouth every evening. , Disp: , Rfl:  .  nitroGLYCERIN (NITROSTAT) 0.4 MG SL tablet, Place 0.4 mg under the tongue every 5 (five) minutes as needed for chest pain. 3 DOSES MAX, Disp: , Rfl:  .  ONE TOUCH ULTRA TEST test strip, 1 each. , Disp: , Rfl:  .  prasugrel (EFFIENT) 10 MG TABS tablet, TAKE 1 TABLET (10 MG TOTAL) BY MOUTH DAILY., Disp: 30 tablet, Rfl: 5 .  venlafaxine XR (EFFEXOR-XR) 75 MG 24 hr capsule, Take 75 mg by mouth every evening. , Disp: , Rfl: 2  Past Medical History: Past Medical History:  Diagnosis Date  . Arthritis   . Coronary artery disease    a. DES to LCx (2008)  b. DES to RCA (2012) c. Overlapping DESx2 to large ramus (03/2014). d. [place] 03/2017 s/p cutting ballon angioplasty to ramus 2.  . GERD (gastroesophageal reflux  disease)   . High cholesterol   . Hypertension   . Obesity   . Type II diabetes mellitus (HCC)     Tobacco Use: History  Smoking Status  . Former Smoker  . Packs/day: 3.00  . Years: 23.00  . Types: Cigarettes  . Quit date: 01/18/1999  Smokeless Tobacco  . Never Used    Labs: Recent Review Flowsheet Data    Labs for ITP Cardiac and Pulmonary Rehab Latest Ref Rng & Units 07/08/2008 12/01/2016   Cholestrol 100 - 199 mg/dL - 123   LDLCALC 0 - 99 mg/dL - 61   HDL >39 mg/dL - 24(L)   Trlycerides 0 - 149 mg/dL - 192(H)   TCO2 - 28 -      Capillary Blood Glucose: Lab Results  Component Value Date   GLUCAP 104 (H) 04/06/2017   GLUCAP 123 (H) 04/06/2017   GLUCAP 125 (H) 04/05/2014   GLUCAP 123 (H) 04/04/2014   GLUCAP 130 (H) 04/04/2014     Exercise  Target Goals: Date: 04/18/17  Exercise Program Goal: Individual exercise prescription set with THRR, safety & activity barriers. Participant demonstrates ability to understand and report RPE using BORG scale, to self-measure pulse accurately, and to acknowledge the importance of the exercise prescription.  Exercise Prescription Goal: Starting with aerobic activity 30 plus minutes a day, 3 days per week for initial exercise prescription. Provide home exercise prescription and guidelines that participant acknowledges understanding prior to discharge.  Activity Barriers & Risk Stratification:     Activity Barriers & Cardiac Risk Stratification - 04/18/17 0857      Activity Barriers & Cardiac Risk Stratification   Activity Barriers Back Problems;Other (comment)   Comments R knee discomfort   Cardiac Risk Stratification High      6 Minute Walk:     6 Minute Walk    Row Name 04/18/17 1144         6 Minute Walk   Phase Initial     Distance 1863 feet     Walk Time 6 minutes     # of Rest Breaks 0     MPH 3.53     METS 4.5     RPE 11     VO2 Peak 15.6     Symptoms Yes (comment)     Comments heavier breathing at end of walk test     Resting HR 67 bpm     Resting BP 128/60     Max Ex. HR 103 bpm     Max Ex. BP 122/72     2 Minute Post BP 116/66        Oxygen Initial Assessment:   Oxygen Re-Evaluation:   Oxygen Discharge (Final Oxygen Re-Evaluation):   Initial Exercise Prescription:     Initial Exercise Prescription - 04/18/17 1100      Date of Initial Exercise RX and Referring Provider   Date 04/18/17   Referring Provider Casandra Doffing MD     Treadmill   MPH 2.8   Grade 1   Minutes 10   METs 3.53     Bike   Level 1.2   Minutes 10   METs 3.38     NuStep   Level 3   SPM 80   Minutes 10   METs 2.5     Prescription Details   Frequency (times per week) 3   Duration Progress to 30 minutes of continuous aerobic without signs/symptoms of physical  distress     Intensity  THRR 40-80% of Max Heartrate 66-132   Ratings of Perceived Exertion 11-15   Perceived Dyspnea 0-4     Progression   Progression Continue to progress workloads to maintain intensity without signs/symptoms of physical distress.     Resistance Training   Training Prescription Yes   Weight 3lbs   Reps 10-15      Perform Capillary Blood Glucose checks as needed.  Exercise Prescription Changes:   Exercise Comments:   Exercise Goals and Review:     Exercise Goals    Row Name 04/18/17 0859             Exercise Goals   Increase Physical Activity Yes       Intervention Provide advice, education, support and counseling about physical activity/exercise needs.;Develop an individualized exercise prescription for aerobic and resistive training based on initial evaluation findings, risk stratification, comorbidities and participant's personal goals.       Expected Outcomes Achievement of increased cardiorespiratory fitness and enhanced flexibility, muscular endurance and strength shown through measurements of functional capacity and personal statement of participant.       Increase Strength and Stamina Yes       Intervention Provide advice, education, support and counseling about physical activity/exercise needs.;Develop an individualized exercise prescription for aerobic and resistive training based on initial evaluation findings, risk stratification, comorbidities and participant's personal goals.       Expected Outcomes Achievement of increased cardiorespiratory fitness and enhanced flexibility, muscular endurance and strength shown through measurements of functional capacity and personal statement of participant.          Exercise Goals Re-Evaluation :    Discharge Exercise Prescription (Final Exercise Prescription Changes):   Nutrition:  Target Goals: Understanding of nutrition guidelines, daily intake of sodium '1500mg'$ , cholesterol '200mg'$ , calories  30% from fat and 7% or less from saturated fats, daily to have 5 or more servings of fruits and vegetables.  Biometrics:     Pre Biometrics - 04/18/17 1149      Pre Biometrics   Waist Circumference 43 inches   Hip Circumference 42.5 inches   Waist to Hip Ratio 1.01 %   Triceps Skinfold 25 mm   % Body Fat 32 %   Grip Strength 51 kg   Flexibility 12 in   Single Leg Stand 14.21 seconds       Nutrition Therapy Plan and Nutrition Goals:     Nutrition Therapy & Goals - 04/18/17 1207      Nutrition Therapy   Diet Carb Modified, Therapeutic Lifestyle Changes     Personal Nutrition Goals   Nutrition Goal Pt to identify food quantities necessary to achieve weight loss of 6-24 lb (2.7-10.9 kg) at graduation from cardiac rehab. Goal wt of 170 lb desired.    Personal Goal #2 CBG concentrations in the normal range or as close to normal as is safely possible.     Intervention Plan   Intervention Prescribe, educate and counsel regarding individualized specific dietary modifications aiming towards targeted core components such as weight, hypertension, lipid management, diabetes, heart failure and other comorbidities.   Expected Outcomes Short Term Goal: Understand basic principles of dietary content, such as calories, fat, sodium, cholesterol and nutrients.;Long Term Goal: Adherence to prescribed nutrition plan.      Nutrition Discharge: Nutrition Scores:     Nutrition Assessments - 04/18/17 1128      MEDFICTS Scores   Pre Score 32      Nutrition Goals Re-Evaluation:   Nutrition Goals Re-Evaluation:  Nutrition Goals Discharge (Final Nutrition Goals Re-Evaluation):   Psychosocial: Target Goals: Acknowledge presence or absence of significant depression and/or stress, maximize coping skills, provide positive support system. Participant is able to verbalize types and ability to use techniques and skills needed for reducing stress and depression.  Initial Review & Psychosocial  Screening:     Initial Psych Review & Screening - 04/18/17 1227      Initial Review   Current issues with None Identified     Family Dynamics   Good Support System? Yes   Comments upon brief assessment, no psychosocial needs identified, no interventions necessary      Barriers   Psychosocial barriers to participate in program There are no identifiable barriers or psychosocial needs.     Screening Interventions   Interventions Provide feedback about the scores to participant;Encouraged to exercise      Quality of Life Scores:   PHQ-9: Recent Review Flowsheet Data    Depression screen [facility] 2/9 09/22/2016   Decreased Interest 0   Down, Depressed, Hopeless 0   PHQ - 2 Score 0     Interpretation of Total Score  Total Score Depression Severity:  1-4 = Minimal depression, 5-9 = Mild depression, 10-14 = Moderate depression, 15-19 = Moderately severe depression, 20-27 = Severe depression   Psychosocial Evaluation and Intervention:   Psychosocial Re-Evaluation:   Psychosocial Discharge (Final Psychosocial Re-Evaluation):   Vocational Rehabilitation: Provide vocational rehab assistance to qualifying candidates.   Vocational Rehab Evaluation & Intervention:     Vocational Rehab - 04/18/17 1228      Initial Vocational Rehab Evaluation & Intervention   Assessment shows need for Vocational Rehabilitation --  pt did not return homework packet with VR assessment.  pt plans to bring forms at next scheduled visit      Education: Education Goals: Education classes will be provided on a weekly basis, covering required topics. Participant will state understanding/return demonstration of topics presented.  Learning Barriers/Preferences:     Learning Barriers/Preferences - 04/18/17 0859      Learning Barriers/Preferences   Learning Preferences Skilled Demonstration;Written Material;Computer/Internet      Education Topics: Count Your Pulse:  -Group instruction provided by  verbal instruction, demonstration, patient participation and written materials to support subject.  Instructors address importance of being able to find your pulse and how to count your pulse when at home without a heart monitor.  Patients get hands on experience counting their pulse with staff help and individually.   Heart Attack, Angina, and Risk Factor Modification:  -Group instruction provided by verbal instruction, video, and written materials to support subject.  Instructors address signs and symptoms of angina and heart attacks.    Also discuss risk factors for heart disease and how to make changes to improve heart health risk factors.   Functional Fitness:  -Group instruction provided by verbal instruction, demonstration, patient participation, and written materials to support subject.  Instructors address safety measures for doing things around the house.  Discuss how to get up and down off the floor, how to pick things up properly, how to safely get out of a chair without assistance, and balance training.   Meditation and Mindfulness:  -Group instruction provided by verbal instruction, patient participation, and written materials to support subject.  Instructor addresses importance of mindfulness and meditation practice to help reduce stress and improve awareness.  Instructor also leads participants through a meditation exercise.    Stretching for Flexibility and Mobility:  -Group instruction provided by verbal  instruction, patient participation, and written materials to support subject.  Instructors lead participants through series of stretches that are designed to increase flexibility thus improving mobility.  These stretches are additional exercise for major muscle groups that are typically performed during regular warm up and cool down.   Hands Only CPR:  -Group verbal, video, and participation provides a basic overview of AHA guidelines for community CPR. Role-play of emergencies  allow participants the opportunity to practice calling for help and chest compression technique with discussion of AED use.   Hypertension: -Group verbal and written instruction that provides a basic overview of hypertension including the most recent diagnostic guidelines, risk factor reduction with self-care instructions and medication management.    Nutrition I class: Heart Healthy Eating:  -Group instruction provided by PowerPoint slides, verbal discussion, and written materials to support subject matter. The instructor gives an explanation and review of the Therapeutic Lifestyle Changes diet recommendations, which includes a discussion on lipid goals, dietary fat, sodium, fiber, plant stanol/sterol esters, sugar, and the components of a well-balanced, healthy diet.   Nutrition II class: Lifestyle Skills:  -Group instruction provided by PowerPoint slides, verbal discussion, and written materials to support subject matter. The instructor gives an explanation and review of label reading, grocery shopping for heart health, heart healthy recipe modifications, and ways to make healthier choices when eating out.   Diabetes Question & Answer:  -Group instruction provided by PowerPoint slides, verbal discussion, and written materials to support subject matter. The instructor gives an explanation and review of diabetes co-morbidities, pre- and post-prandial blood glucose goals, pre-exercise blood glucose goals, signs, symptoms, and treatment of hypoglycemia and hyperglycemia, and foot care basics.   Diabetes Blitz:  -Group instruction provided by PowerPoint slides, verbal discussion, and written materials to support subject matter. The instructor gives an explanation and review of the physiology behind type 1 and type 2 diabetes, diabetes medications and rational behind using different medications, pre- and post-prandial blood glucose recommendations and Hemoglobin A1c goals, diabetes diet, and  exercise including blood glucose guidelines for exercising safely.    Portion Distortion:  -Group instruction provided by PowerPoint slides, verbal discussion, written materials, and food models to support subject matter. The instructor gives an explanation of serving size versus portion size, changes in portions sizes over the last 20 years, and what consists of a serving from each food group.   Stress Management:  -Group instruction provided by verbal instruction, video, and written materials to support subject matter.  Instructors review role of stress in heart disease and how to cope with stress positively.     Exercising on Your Own:  -Group instruction provided by verbal instruction, power point, and written materials to support subject.  Instructors discuss benefits of exercise, components of exercise, frequency and intensity of exercise, and end points for exercise.  Also discuss use of nitroglycerin and activating EMS.  Review options of places to exercise outside of rehab.  Review guidelines for sex with heart disease.   Cardiac Drugs I:  -Group instruction provided by verbal instruction and written materials to support subject.  Instructor reviews cardiac drug classes: antiplatelets, anticoagulants, beta blockers, and statins.  Instructor discusses reasons, side effects, and lifestyle considerations for each drug class.   Cardiac Drugs II:  -Group instruction provided by verbal instruction and written materials to support subject.  Instructor reviews cardiac drug classes: angiotensin converting enzyme inhibitors (ACE-I), angiotensin II receptor blockers (ARBs), nitrates, and calcium channel blockers.  Instructor discusses reasons, side effects, and  lifestyle considerations for each drug class.   Anatomy and Physiology of the Circulatory System:  Group verbal and written instruction and models provide basic cardiac anatomy and physiology, with the coronary electrical and arterial  systems. Review of: AMI, Angina, Valve disease, Heart Failure, Peripheral Artery Disease, Cardiac Arrhythmia, Pacemakers, and the ICD.   Other Education:  -Group or individual verbal, written, or video instructions that support the educational goals of the cardiac rehab program.   Knowledge Questionnaire Score:     Knowledge Questionnaire Score - 04/18/17 1208      Knowledge Questionnaire Score   Pre Score      DM 13/15      Core Components/Risk Factors/Patient Goals at Admission:     Personal Goals and Risk Factors at Admission - 04/18/17 1150      Core Components/Risk Factors/Patient Goals on Admission   Diabetes Yes   Intervention Provide education about signs/symptoms and action to take for hypo/hyperglycemia.;Provide education about proper nutrition, including hydration, and aerobic/resistive exercise prescription along with prescribed medications to achieve blood glucose in normal ranges: Fasting glucose 65-99 mg/dL   Expected Outcomes Short Term: Participant verbalizes understanding of the signs/symptoms and immediate care of hyper/hypoglycemia, proper foot care and importance of medication, aerobic/resistive exercise and nutrition plan for blood glucose control.;Long Term: Attainment of HbA1C < 7%.   Hypertension Yes   Intervention Provide education on lifestyle modifcations including regular physical activity/exercise, weight management, moderate sodium restriction and increased consumption of fresh fruit, vegetables, and low fat dairy, alcohol moderation, and smoking cessation.;Monitor prescription use compliance.   Expected Outcomes Short Term: Continued assessment and intervention until BP is < 140/27m HG in hypertensive participants. < 130/83mHG in hypertensive participants with diabetes, heart failure or chronic kidney disease.;Long Term: Maintenance of blood pressure at goal levels.   Lipids Yes   Intervention Provide education and support for participant on nutrition  & aerobic/resistive exercise along with prescribed medications to achieve LDL '70mg'$ , HDL >'40mg'$ .   Expected Outcomes Short Term: Participant states understanding of desired cholesterol values and is compliant with medications prescribed. Participant is following exercise prescription and nutrition guidelines.;Long Term: Cholesterol controlled with medications as prescribed, with individualized exercise RX and with personalized nutrition plan. Value goals: LDL < '70mg'$ , HDL > 40 mg.      Core Components/Risk Factors/Patient Goals Review:    Core Components/Risk Factors/Patient Goals at Discharge (Final Review):    ITP Comments:     ITP Comments    Row Name 04/18/17 0854           ITP Comments Medical Director; Dr. TrFransico Him        Comments: Patient attended orientation from [phone]  review rules and guidelines for program. Completed 6 minute walk test, Intitial ITP, and exercise prescription.  VSS. Telemetry-sinus rhythm.  Asymptomatic.

## 2017-04-18 NOTE — Progress Notes (Signed)
Troy Martinez 56 y.o. male DOB: [birth year] MRN: [id number]      Nutrition Note  1. 04/06/17 Status post coronary artery stent placement    Past Medical History:  Diagnosis Date  . Arthritis   . Coronary artery disease    a. DES to LCx (2008)  b. DES to RCA (2012) c. Overlapping DESx2 to large ramus (03/2014). d. [place] 03[place] s/p cutting ballon angioplasty to ramus 2.  . GERD (gastroesophageal reflux disease)   . High cholesterol   . Hypertension   . Obesity   . Type II diabetes mellitus (HCC)    Meds reviewed. Glimepiride, Janumet noted  HT: Ht Readings from Last 1 Encounters:  04/18/17 5\' 8"  (1.727 m)    WT: Wt Readings from Last 3 Encounters:  04/18/17 205 lb 7.5 oz (93.2 kg)  04/13/17 204 lb (92.5 kg)  04/06/17 206 lb (93.4 kg)     BMI 31.3   Current tobacco use? No  Labs:  Lipid Panel     Component Value Date/Time   CHOL 123 12/01/2016 0902   TRIG 192 (H) 12/01/2016 0902   HDL 24 (L) 12/01/2016 0902   CHOLHDL 5.1 (H) 12/01/2016 0902   LDLCALC 61 12/01/2016 0902    No results found for: HGBA1C CBG (last 3)  No results for input(s): GLUCAP in the last 72 hours.  Nutrition Note Spoke with pt. Pt known to me from previous admission. Nutrition plan and goals reviewed with pt. Pt is following Step 2 of the Therapeutic Lifestyle Changes diet. Pt wants to lose wt. Pt states he lost 30 lb with Nutrisystem but he gained the wt back. Pt is currently trying to do Nutrisystem on his own "and I'm losing wt at a slower pace." Pt reports he has lost 15 lb since he started his version of Nutrisystem. Fad diets and tips for sustained wt loss reviewed. Pt is diabetic. Pt does not recall what his last A1c was. Pt checks CBG's "only when I feel bad."  Pt expressed understanding of the information reviewed. Pt aware of nutrition education classes offered and plans on attending nutrition classes.  Nutrition Diagnosis ? Food-and nutrition-related knowledge deficit related to lack of  exposure to information as related to diagnosis of: ? CVD ? DM ? Obesity related to excessive energy intake as evidenced by a BMI of 31.3  Nutrition Intervention ? Pt's individual nutrition plan and goals reviewed with pt.  Nutrition Goal(s):  ? Pt to identify food quantities necessary to achieve weight loss of 6-24 lb (2.7-10.9 kg) at graduation from cardiac rehab. Goal wt of 170 lb desired.  ? CBG concentrations in the normal range or as close to normal as is safely possible.  Plan:  Pt to attend nutrition classes ? Nutrition I ? Nutrition II ? Portion Distortion ? Diabetes Blitz ? Diabetes Q & A Will provide client-centered nutrition education as part of interdisciplinary care.   Monitor and evaluate progress toward nutrition goal with team.  [name], M.Ed, RD, LDN, CDE 04/18/2017 11:56 AM

## 2017-04-18 NOTE — Telephone Encounter (Signed)
Danielle, just wanted to clarify your last note - he is to continue aspirin until August 19, not hold. Please verify that is what the patient was told. Thanks! Dayna Dunn PA-C

## 2017-04-25 ENCOUNTER — Ambulatory Visit: Payer: 59 | Admitting: Physician Assistant

## 2017-04-26 ENCOUNTER — Encounter (HOSPITAL_COMMUNITY)
Admission: RE | Admit: 2017-04-26 | Discharge: 2017-04-26 | Disposition: A | Discharge status: Home / Self Care | Payer: 59 | Source: Ambulatory Visit | Attending: Interventional Cardiology | Admitting: Interventional Cardiology

## 2017-04-26 ENCOUNTER — Encounter (HOSPITAL_COMMUNITY): Payer: 59

## 2017-04-26 DIAGNOSIS — I10 Essential (primary) hypertension: Secondary | ICD-10-CM | POA: Insufficient documentation

## 2017-04-26 DIAGNOSIS — I2510 Atherosclerotic heart disease of native coronary artery without angina pectoris: Secondary | ICD-10-CM | POA: Diagnosis not present

## 2017-04-26 DIAGNOSIS — Z713 Dietary counseling and surveillance: Secondary | ICD-10-CM | POA: Insufficient documentation

## 2017-04-26 DIAGNOSIS — Z955 Presence of coronary angioplasty implant and graft: Secondary | ICD-10-CM | POA: Insufficient documentation

## 2017-04-26 DIAGNOSIS — E7800 Pure hypercholesterolemia, unspecified: Secondary | ICD-10-CM | POA: Diagnosis not present

## 2017-04-26 DIAGNOSIS — K219 Gastro-esophageal reflux disease without esophagitis: Secondary | ICD-10-CM | POA: Insufficient documentation

## 2017-04-26 DIAGNOSIS — M1990 Unspecified osteoarthritis, unspecified site: Secondary | ICD-10-CM | POA: Insufficient documentation

## 2017-04-26 DIAGNOSIS — E669 Obesity, unspecified: Secondary | ICD-10-CM | POA: Diagnosis not present

## 2017-04-26 DIAGNOSIS — E119 Type 2 diabetes mellitus without complications: Secondary | ICD-10-CM | POA: Diagnosis not present

## 2017-04-26 LAB — GLUCOSE, CAPILLARY
GLUCOSE-CAPILLARY: 180 mg/dL — AB (ref 65–99)
Glucose-Capillary: 139 mg/dL — ABNORMAL HIGH (ref 65–99)

## 2017-04-28 ENCOUNTER — Encounter (HOSPITAL_COMMUNITY): Payer: 59

## 2017-04-28 ENCOUNTER — Encounter (HOSPITAL_COMMUNITY)
Admission: RE | Admit: 2017-04-28 | Discharge: 2017-04-28 | Disposition: A | Discharge status: Home / Self Care | Payer: 59 | Source: Ambulatory Visit | Attending: Interventional Cardiology | Admitting: Interventional Cardiology

## 2017-04-28 ENCOUNTER — Encounter (HOSPITAL_COMMUNITY): Payer: Self-pay

## 2017-04-28 DIAGNOSIS — Z713 Dietary counseling and surveillance: Secondary | ICD-10-CM | POA: Diagnosis not present

## 2017-04-28 DIAGNOSIS — Z955 Presence of coronary angioplasty implant and graft: Secondary | ICD-10-CM

## 2017-04-28 NOTE — Progress Notes (Signed)
Daily Session Note  Patient Details  Name: TAVYN KURKA MRN: [id number] Date of Birth: 1961/02/18 Referring Provider:     CARDIAC REHAB PHASE II ORIENTATION from 04/18/2017 in [facility]  Referring Provider  Casandra Doffing MD      Encounter Date: 04/26/2017  Check In:     Session Check In - 04/28/17 0944      Check-In   Location [facility]   Staff Present Cleda Mccreedy, MS, Exercise Physiologist;Joann Rion, RN, Deland Pretty, MS, ACSM CEP, Exercise Physiologist   Supervising physician immediately available to respond to emergencies [facility] immediately available   Physician(s) Dr. Clementeen Graham   Medication changes reported     No   Fall or balance concerns reported    No   Tobacco Cessation No Change   Warm-up and Cool-down Performed as group-led instruction   Resistance Training Performed Yes   VAD Patient? No     Pain Assessment   Currently in Pain? No/denies   Multiple Pain Sites No      Capillary Blood Glucose: No results found for this or any previous visit (from the past 24 hour(s)).    History  Smoking Status  . Former Smoker  . Packs/day: 3.00  . Years: 23.00  . Types: Cigarettes  . Quit date: 01/18/1999  Smokeless Tobacco  . Never Used    Goals Met:  Exercise tolerated well  Goals Unmet:  Not Applicable  Comments: Pt started cardiac rehab today.  Pt tolerated light exercise without difficulty. VSS, telemetry-sinus rhythm,  asymptomatic.  Medication list reconciled. Pt denies barriers to medicaiton compliance.  PSYCHOSOCIAL ASSESSMENT:  PHQ-0.  Pt report his chronic depression is currently well managed with Wellbutrin, Effexor and therapy. Pt exhibits positive coping skills, hopeful outlook with supportive family. No psychosocial needs identified at this time, no psychosocial interventions necessary.    Pt enjoys his work at [employer] (formerly International aid/development worker) as Lobbyist, Armed forces logistics/support/administrative officer.  Pt goals for cardiac rehab are to develop exercise habit and lose weight. Pt encouraged to participate in CR exercise with home exercise activities and nutrition education to increase ability to safely and effectively meet these goals.   Pt oriented to exercise equipment and routine.    Understanding verbalized.   Dr. Fransico Him is Medical Director for Cardiac Rehab at [facility].

## 2017-05-01 ENCOUNTER — Encounter (HOSPITAL_COMMUNITY): Payer: 59

## 2017-05-01 ENCOUNTER — Encounter (HOSPITAL_COMMUNITY)
Admission: RE | Admit: 2017-05-01 | Discharge: 2017-05-01 | Disposition: A | Discharge status: Home / Self Care | Payer: 59 | Source: Ambulatory Visit | Attending: Interventional Cardiology | Admitting: Interventional Cardiology

## 2017-05-01 DIAGNOSIS — Z713 Dietary counseling and surveillance: Secondary | ICD-10-CM | POA: Diagnosis not present

## 2017-05-01 DIAGNOSIS — Z955 Presence of coronary angioplasty implant and graft: Secondary | ICD-10-CM

## 2017-05-01 LAB — GLUCOSE, CAPILLARY: GLUCOSE-CAPILLARY: 149 mg/dL — AB (ref 65–99)

## 2017-05-03 ENCOUNTER — Encounter (HOSPITAL_COMMUNITY): Payer: 59

## 2017-05-03 ENCOUNTER — Encounter (HOSPITAL_COMMUNITY)
Admission: RE | Admit: 2017-05-03 | Discharge: 2017-05-03 | Disposition: A | Discharge status: Home / Self Care | Payer: 59 | Source: Ambulatory Visit | Attending: Interventional Cardiology | Admitting: Interventional Cardiology

## 2017-05-03 DIAGNOSIS — Z955 Presence of coronary angioplasty implant and graft: Secondary | ICD-10-CM

## 2017-05-03 DIAGNOSIS — Z713 Dietary counseling and surveillance: Secondary | ICD-10-CM | POA: Diagnosis not present

## 2017-05-03 NOTE — Progress Notes (Signed)
Cardiac Individual Treatment Plan  Patient Details  Name: Troy Martinez MRN: [id number] Date of Birth: [birth year] Referring Provider:     CARDIAC REHAB PHASE II ORIENTATION from 04/18/2017 in [place]  Referring Provider  Casandra Doffing MD      Initial Encounter Date:    CARDIAC REHAB PHASE II ORIENTATION from 04/18/2017 in [place]  Date  04/18/17  Referring Provider  Casandra Doffing MD      Visit Diagnosis: 04/06/17 Status post coronary artery stent placement  Patient's Home Medications on Admission:  Current Outpatient Prescriptions:  .  acetaminophen (TYLENOL) 500 MG tablet, Take 2 tablets (1,000 mg total) by mouth daily as needed for moderate pain or headache., Disp: , Rfl:  .  APPLE CIDER VINEGAR PO, Take 30 mLs by mouth at bedtime., Disp: , Rfl:  .  Ascorbic Acid (VITAMIN C) 500 MG CHEW, Chew 250 mg by mouth daily., Disp: , Rfl:  .  aspirin EC 81 MG tablet, Take 1 tablet (81 mg total) by mouth daily., Disp: 30 tablet, Rfl: 0 .  atorvastatin (LIPITOR) 80 MG tablet, Take 80 mg by mouth at bedtime., Disp: , Rfl:  .  buPROPion (WELLBUTRIN SR) 150 MG 12 hr tablet, Take 150 mg by mouth 2 (two) times daily., Disp: , Rfl: 1 .  Cyanocobalamin (B-12 COMPLIANCE INJECTION) 1000 MCG/ML KIT, Inject as directed every 30 (thirty) days. , Disp: , Rfl:  .  esomeprazole (NEXIUM) 20 MG capsule, Take 20 mg by mouth daily at 12 noon., Disp: , Rfl:  .  Fe-Succ Ac-C-Thre Ac-B12-FA (FERREX 150 FORTE PLUS) 50-100 MG CAPS, Take 1 capsule by mouth every evening. , Disp: , Rfl:  .  fenofibrate 160 MG tablet, Take 160 mg by mouth every evening. , Disp: , Rfl:  .  gabapentin (NEURONTIN) 300 MG capsule, Take 1 capsule (300 mg) by mouth each morning and 2 capsule (600 mg) by mouth each evening., Disp: , Rfl:  .  glimepiride (AMARYL) 1 MG tablet, Take 0.5 mg by mouth daily with breakfast. , Disp: , Rfl:  .  hydrocortisone cream 1 %, Apply 1  application topically daily as needed for itching., Disp: , Rfl:  .  JANUMET XR 50-1000 MG TB24, Take 1 tablet by mouth 2 (two) times daily. , Disp: , Rfl: 3 .  lisinopril (PRINIVIL,ZESTRIL) 10 MG tablet, Take 5 mg by mouth daily., Disp: , Rfl: 12 .  Multiple Vitamins-Minerals (MULTIVITAMIN PO), Take 1 tablet by mouth daily., Disp: , Rfl:  .  nebivolol (BYSTOLIC) 5 MG tablet, Take 5 mg by mouth every evening. , Disp: , Rfl:  .  nitroGLYCERIN (NITROSTAT) 0.4 MG SL tablet, Place 0.4 mg under the tongue every 5 (five) minutes as needed for chest pain. 3 DOSES MAX, Disp: , Rfl:  .  ONE TOUCH ULTRA TEST test strip, 1 each. , Disp: , Rfl:  .  prasugrel (EFFIENT) 10 MG TABS tablet, TAKE 1 TABLET (10 MG TOTAL) BY MOUTH DAILY., Disp: 30 tablet, Rfl: 5 .  venlafaxine XR (EFFEXOR-XR) 75 MG 24 hr capsule, Take 75 mg by mouth every evening. , Disp: , Rfl: 2  Past Medical History: Past Medical History:  Diagnosis Date  . Arthritis   . Coronary artery disease    a. DES to LCx (2008)  b. DES to RCA (2012) c. Overlapping DESx2 to large ramus (03/2014). d. [place] 03/2017 s/p cutting ballon angioplasty to ramus 2.  . GERD (gastroesophageal reflux  disease)   . High cholesterol   . Hypertension   . Obesity   . Type II diabetes mellitus (HCC)     Tobacco Use: History  Smoking Status  . Former Smoker  . Packs/day: 3.00  . Years: 23.00  . Types: Cigarettes  . Quit date: 01/18/1999  Smokeless Tobacco  . Never Used    Labs: Recent Review Flowsheet Data    Labs for ITP Cardiac and Pulmonary Rehab Latest Ref Rng & Units 07/08/2008 12/01/2016   Cholestrol 100 - 199 mg/dL - 123   LDLCALC 0 - 99 mg/dL - 61   HDL >39 mg/dL - 24(L)   Trlycerides 0 - 149 mg/dL - 192(H)   TCO2 - 28 -      Capillary Blood Glucose: Lab Results  Component Value Date   GLUCAP 149 (H) 05/01/2017   GLUCAP 139 (H) 04/26/2017   GLUCAP 180 (H) 04/26/2017   GLUCAP 104 (H) 04/06/2017   GLUCAP 123 (H) 04/06/2017     Exercise  Target Goals:    Exercise Program Goal: Individual exercise prescription set with THRR, safety & activity barriers. Participant demonstrates ability to understand and report RPE using BORG scale, to self-measure pulse accurately, and to acknowledge the importance of the exercise prescription.  Exercise Prescription Goal: Starting with aerobic activity 30 plus minutes a day, 3 days per week for initial exercise prescription. Provide home exercise prescription and guidelines that participant acknowledges understanding prior to discharge.  Activity Barriers & Risk Stratification:     Activity Barriers & Cardiac Risk Stratification - 04/18/17 0857      Activity Barriers & Cardiac Risk Stratification   Activity Barriers Back Problems;Other (comment)   Comments R knee discomfort   Cardiac Risk Stratification High      6 Minute Walk:     6 Minute Walk    Row Name 04/18/17 1144         6 Minute Walk   Phase Initial     Distance 1863 feet     Walk Time 6 minutes     # of Rest Breaks 0     MPH 3.53     METS 4.5     RPE 11     VO2 Peak 15.6     Symptoms Yes (comment)     Comments heavier breathing at end of walk test     Resting HR 67 bpm     Resting BP 128/60     Max Ex. HR 103 bpm     Max Ex. BP 122/72     2 Minute Post BP 116/66        Oxygen Initial Assessment:   Oxygen Re-Evaluation:   Oxygen Discharge (Final Oxygen Re-Evaluation):   Initial Exercise Prescription:     Initial Exercise Prescription - 04/18/17 1100      Date of Initial Exercise RX and Referring Provider   Date 04/18/17   Referring Provider Casandra Doffing MD     Treadmill   MPH 2.8   Grade 1   Minutes 10   METs 3.53     Bike   Level 1.2   Minutes 10   METs 3.38     NuStep   Level 3   SPM 80   Minutes 10   METs 2.5     Prescription Details   Frequency (times per week) 3   Duration Progress to 30 minutes of continuous aerobic without signs/symptoms of physical distress      Intensity  THRR 40-80% of Max Heartrate 66-132   Ratings of Perceived Exertion 11-15   Perceived Dyspnea 0-4     Progression   Progression Continue to progress workloads to maintain intensity without signs/symptoms of physical distress.     Resistance Training   Training Prescription Yes   Weight 3lbs   Reps 10-15      Perform Capillary Blood Glucose checks as needed.  Exercise Prescription Changes:     Exercise Prescription Changes    Row Name 04/26/17 1131             Response to Exercise   Blood Pressure (Admit) 128/72       Blood Pressure (Exercise) 144/78       Blood Pressure (Exit) 118/70       Heart Rate (Admit) 81 bpm       Heart Rate (Exercise) 110 bpm       Heart Rate (Exit) 81 bpm       Rating of Perceived Exertion (Exercise) 13       Symptoms none        Comments Pt was oriented to exercise equiment. Pt did well with exercise       Duration Continue with 30 min of aerobic exercise without signs/symptoms of physical distress.       Intensity THRR unchanged         Progression   Progression Continue to progress workloads to maintain intensity without signs/symptoms of physical distress.       Average METs 3.8         Resistance Training   Training Prescription Yes       Weight 5lbs       Reps 10-15       Time 10 Minutes         Treadmill   MPH 2.8       Grade 1       Minutes 19       METs 3.53         Bike   Level 1.2       Minutes 10       METs 3.38         NuStep   Level 3       SPM 90       Minutes 10       METs 3.9          Exercise Comments:     Exercise Comments    Row Name 05/02/17 1134           Exercise Comments Pt was oriented to exercise equipment on 04/26/17. Pt responded well to exercise sessioin and reported no signs/symptoms of CP, dizziness or unusual SOB.          Exercise Goals and Review:     Exercise Goals    Row Name 04/18/17 0859             Exercise Goals   Increase Physical Activity Yes        Intervention Provide advice, education, support and counseling about physical activity/exercise needs.;Develop an individualized exercise prescription for aerobic and resistive training based on initial evaluation findings, risk stratification, comorbidities and participant's personal goals.       Expected Outcomes Achievement of increased cardiorespiratory fitness and enhanced flexibility, muscular endurance and strength shown through measurements of functional capacity and personal statement of participant.       Increase Strength and Stamina Yes       Intervention Provide advice,  education, support and counseling about physical activity/exercise needs.;Develop an individualized exercise prescription for aerobic and resistive training based on initial evaluation findings, risk stratification, comorbidities and participant's personal goals.       Expected Outcomes Achievement of increased cardiorespiratory fitness and enhanced flexibility, muscular endurance and strength shown through measurements of functional capacity and personal statement of participant.          Exercise Goals Re-Evaluation :    Discharge Exercise Prescription (Final Exercise Prescription Changes):     Exercise Prescription Changes - 04/26/17 1131      Response to Exercise   Blood Pressure (Admit) 128/72   Blood Pressure (Exercise) 144/78   Blood Pressure (Exit) 118/70   Heart Rate (Admit) 81 bpm   Heart Rate (Exercise) 110 bpm   Heart Rate (Exit) 81 bpm   Rating of Perceived Exertion (Exercise) 13   Symptoms none    Comments Pt was oriented to exercise equiment. Pt did well with exercise   Duration Continue with 30 min of aerobic exercise without signs/symptoms of physical distress.   Intensity THRR unchanged     Progression   Progression Continue to progress workloads to maintain intensity without signs/symptoms of physical distress.   Average METs 3.8     Resistance Training   Training Prescription Yes    Weight 5lbs   Reps 10-15   Time 10 Minutes     Treadmill   MPH 2.8   Grade 1   Minutes 19   METs 3.53     Bike   Level 1.2   Minutes 10   METs 3.38     NuStep   Level 3   SPM 90   Minutes 10   METs 3.9      Nutrition:  Target Goals: Understanding of nutrition guidelines, daily intake of sodium <1556m, cholesterol <2037m calories 30% from fat and 7% or less from saturated fats, daily to have 5 or more servings of fruits and vegetables.  Biometrics:     Pre Biometrics - 04/18/17 1149      Pre Biometrics   Waist Circumference 43 inches   Hip Circumference 42.5 inches   Waist to Hip Ratio 1.01 %   Triceps Skinfold 25 mm   % Body Fat 32 %   Grip Strength 51 kg   Flexibility 12 in   Single Leg Stand 14.21 seconds       Nutrition Therapy Plan and Nutrition Goals:     Nutrition Therapy & Goals - 04/18/17 1207      Nutrition Therapy   Diet Carb Modified, Therapeutic Lifestyle Changes     Personal Nutrition Goals   Nutrition Goal Pt to identify food quantities necessary to achieve weight loss of 6-24 lb (2.7-10.9 kg) at graduation from cardiac rehab. Goal wt of 170 lb desired.    Personal Goal #2 CBG concentrations in the normal range or as close to normal as is safely possible.     Intervention Plan   Intervention Prescribe, educate and counsel regarding individualized specific dietary modifications aiming towards targeted core components such as weight, hypertension, lipid management, diabetes, heart failure and other comorbidities.   Expected Outcomes Short Term Goal: Understand basic principles of dietary content, such as calories, fat, sodium, cholesterol and nutrients.;Long Term Goal: Adherence to prescribed nutrition plan.      Nutrition Discharge: Nutrition Scores:     Nutrition Assessments - 04/18/17 1128      MEDFICTS Scores   Pre Score 32  Nutrition Goals Re-Evaluation:   Nutrition Goals Re-Evaluation:   Nutrition Goals Discharge  (Final Nutrition Goals Re-Evaluation):   Psychosocial: Target Goals: Acknowledge presence or absence of significant depression and/or stress, maximize coping skills, provide positive support system. Participant is able to verbalize types and ability to use techniques and skills needed for reducing stress and depression.  Initial Review & Psychosocial Screening:     Initial Psych Review & Screening - 04/28/17 1208      Initial Review   Current issues with None Identified     Family Dynamics   Good Support System? Yes   Comments no psychosocial needs identified, no interventions necessary      Barriers   Psychosocial barriers to participate in program There are no identifiable barriers or psychosocial needs.     Screening Interventions   Interventions Provide feedback about the scores to participant;Encouraged to exercise      Quality of Life Scores:     Quality of Life - 04/28/17 1206      Quality of Life Scores   Health/Function Pre 18.8 %   Socioeconomic Pre 23.7 %   Psych/Spiritual Pre 19.5 %   Family Pre 19.5 %   GLOBAL Pre 20.07 %      PHQ-9: Recent Review Flowsheet Data    Depression screen [facility] 2/9 04/28/2017 09/22/2016   Decreased Interest 0 0   Down, Depressed, Hopeless 0  0   PHQ - 2 Score 0 0     Interpretation of Total Score  Total Score Depression Severity:  1-4 = Minimal depression, 5-9 = Mild depression, 10-14 = Moderate depression, 15-19 = Moderately severe depression, 20-27 = Severe depression   Psychosocial Evaluation and Intervention:     Psychosocial Evaluation - 04/28/17 1212      Psychosocial Evaluation & Interventions   Interventions Stress management education;Relaxation education;Encouraged to exercise with the program and follow exercise prescription   Comments pt with history of depression currently well managed with effoxor, wellbutrin and therapy   Expected Outcomes pt will exhibit maintenance of well managed depression, positive  outlook and good coping skills    Continue Psychosocial Services  Follow up required by staff      Psychosocial Re-Evaluation:     Psychosocial Re-Evaluation    [place] Name 05/02/17 [phone]             Psychosocial Re-Evaluation   Current issues with History of Depression;Current Depression       Comments pt with known history of depression, symptoms well managed on current treatment.         Expected Outcomes pt will demonstrate good coping skills with positive outlook.       Interventions Relaxation education;Stress management education;Encouraged to attend Cardiac Rehabilitation for the exercise       Continue Psychosocial Services  Follow up required by staff          Psychosocial Discharge (Final Psychosocial Re-Evaluation):     Psychosocial Re-Evaluation - 05/02/17 0731      Psychosocial Re-Evaluation   Current issues with History of Depression;Current Depression   Comments pt with known history of depression, symptoms well managed on current treatment.     Expected Outcomes pt will demonstrate good coping skills with positive outlook.   Interventions Relaxation education;Stress management education;Encouraged to attend Cardiac Rehabilitation for the exercise   Continue Psychosocial Services  Follow up required by staff      Vocational Rehabilitation: Provide vocational rehab assistance to qualifying candidates.   Vocational Rehab Evaluation &  Intervention:     Vocational Rehab - 04/28/17 1207      Initial Vocational Rehab Evaluation & Intervention   Assessment shows need for Vocational Rehabilitation No      Education: Education Goals: Education classes will be provided on a weekly basis, covering required topics. Participant will state understanding/return demonstration of topics presented.  Learning Barriers/Preferences:     Learning Barriers/Preferences - 04/18/17 0859      Learning Barriers/Preferences   Learning Preferences Skilled  Demonstration;Written Material;Computer/Internet      Education Topics: Count Your Pulse:  -Group instruction provided by verbal instruction, demonstration, patient participation and written materials to support subject.  Instructors address importance of being able to find your pulse and how to count your pulse when at home without a heart monitor.  Patients get hands on experience counting their pulse with staff help and individually.   Heart Attack, Angina, and Risk Factor Modification:  -Group instruction provided by verbal instruction, video, and written materials to support subject.  Instructors address signs and symptoms of angina and heart attacks.    Also discuss risk factors for heart disease and how to make changes to improve heart health risk factors.   Functional Fitness:  -Group instruction provided by verbal instruction, demonstration, patient participation, and written materials to support subject.  Instructors address safety measures for doing things around the house.  Discuss how to get up and down off the floor, how to pick things up properly, how to safely get out of a chair without assistance, and balance training.   Meditation and Mindfulness:  -Group instruction provided by verbal instruction, patient participation, and written materials to support subject.  Instructor addresses importance of mindfulness and meditation practice to help reduce stress and improve awareness.  Instructor also leads participants through a meditation exercise.    CARDIAC REHAB PHASE II EXERCISE from 05/03/2017 in [place]  Date  05/03/17  Instruction Review Code  2- meets goals/outcomes      Stretching for Flexibility and Mobility:  -Group instruction provided by verbal instruction, patient participation, and written materials to support subject.  Instructors lead participants through series of stretches that are designed to increase flexibility thus  improving mobility.  These stretches are additional exercise for major muscle groups that are typically performed during regular warm up and cool down.   Hands Only CPR:  -Group verbal, video, and participation provides a basic overview of AHA guidelines for community CPR. Role-play of emergencies allow participants the opportunity to practice calling for help and chest compression technique with discussion of AED use.   Hypertension: -Group verbal and written instruction that provides a basic overview of hypertension including the most recent diagnostic guidelines, risk factor reduction with self-care instructions and medication management.    Nutrition I class: Heart Healthy Eating:  -Group instruction provided by PowerPoint slides, verbal discussion, and written materials to support subject matter. The instructor gives an explanation and review of the Therapeutic Lifestyle Changes diet recommendations, which includes a discussion on lipid goals, dietary fat, sodium, fiber, plant stanol/sterol esters, sugar, and the components of a well-balanced, healthy diet.   Nutrition II class: Lifestyle Skills:  -Group instruction provided by PowerPoint slides, verbal discussion, and written materials to support subject matter. The instructor gives an explanation and review of label reading, grocery shopping for heart health, heart healthy recipe modifications, and ways to make healthier choices when eating out.   Diabetes Question & Answer:  -Group instruction provided by PowerPoint  slides, verbal discussion, and written materials to support subject matter. The instructor gives an explanation and review of diabetes co-morbidities, pre- and post-prandial blood glucose goals, pre-exercise blood glucose goals, signs, symptoms, and treatment of hypoglycemia and hyperglycemia, and foot care basics.   Diabetes Blitz:  -Group instruction provided by PowerPoint slides, verbal discussion, and written  materials to support subject matter. The instructor gives an explanation and review of the physiology behind type 1 and type 2 diabetes, diabetes medications and rational behind using different medications, pre- and post-prandial blood glucose recommendations and Hemoglobin A1c goals, diabetes diet, and exercise including blood glucose guidelines for exercising safely.    Portion Distortion:  -Group instruction provided by PowerPoint slides, verbal discussion, written materials, and food models to support subject matter. The instructor gives an explanation of serving size versus portion size, changes in portions sizes over the last 20 years, and what consists of a serving from each food group.   Stress Management:  -Group instruction provided by verbal instruction, video, and written materials to support subject matter.  Instructors review role of stress in heart disease and how to cope with stress positively.     Exercising on Your Own:  -Group instruction provided by verbal instruction, power point, and written materials to support subject.  Instructors discuss benefits of exercise, components of exercise, frequency and intensity of exercise, and end points for exercise.  Also discuss use of nitroglycerin and activating EMS.  Review options of places to exercise outside of rehab.  Review guidelines for sex with heart disease.   Cardiac Drugs I:  -Group instruction provided by verbal instruction and written materials to support subject.  Instructor reviews cardiac drug classes: antiplatelets, anticoagulants, beta blockers, and statins.  Instructor discusses reasons, side effects, and lifestyle considerations for each drug class.   CARDIAC REHAB PHASE II EXERCISE from 05/03/2017 in [place]  Date  04/28/17  Educator  Kennyth Lose  Instruction Review Code  2- meets goals/outcomes      Cardiac Drugs II:  -Group instruction provided by verbal instruction and written  materials to support subject.  Instructor reviews cardiac drug classes: angiotensin converting enzyme inhibitors (ACE-I), angiotensin II receptor blockers (ARBs), nitrates, and calcium channel blockers.  Instructor discusses reasons, side effects, and lifestyle considerations for each drug class.   Anatomy and Physiology of the Circulatory System:  Group verbal and written instruction and models provide basic cardiac anatomy and physiology, with the coronary electrical and arterial systems. Review of: AMI, Angina, Valve disease, Heart Failure, Peripheral Artery Disease, Cardiac Arrhythmia, Pacemakers, and the ICD.   Other Education:  -Group or individual verbal, written, or video instructions that support the educational goals of the cardiac rehab program.   Knowledge Questionnaire Score:     Knowledge Questionnaire Score - 04/28/17 1206      Knowledge Questionnaire Score   Pre Score 20/24 cardiac, DM 13/15      Core Components/Risk Factors/Patient Goals at Admission:     Personal Goals and Risk Factors at Admission - 04/18/17 1150      Core Components/Risk Factors/Patient Goals on Admission   Diabetes Yes   Intervention Provide education about signs/symptoms and action to take for hypo/hyperglycemia.;Provide education about proper nutrition, including hydration, and aerobic/resistive exercise prescription along with prescribed medications to achieve blood glucose in normal ranges: Fasting glucose 65-99 mg/dL   Expected Outcomes Short Term: Participant verbalizes understanding of the signs/symptoms and immediate care of hyper/hypoglycemia, proper foot care and importance of medication,  aerobic/resistive exercise and nutrition plan for blood glucose control.;Long Term: Attainment of HbA1C < 7%.   Hypertension Yes   Intervention Provide education on lifestyle modifcations including regular physical activity/exercise, weight management, moderate sodium restriction and increased  consumption of fresh fruit, vegetables, and low fat dairy, alcohol moderation, and smoking cessation.;Monitor prescription use compliance.   Expected Outcomes Short Term: Continued assessment and intervention until BP is < 140/59m HG in hypertensive participants. < 130/863mHG in hypertensive participants with diabetes, heart failure or chronic kidney disease.;Long Term: Maintenance of blood pressure at goal levels.   Lipids Yes   Intervention Provide education and support for participant on nutrition & aerobic/resistive exercise along with prescribed medications to achieve LDL <703mHDL >2m79m Expected Outcomes Short Term: Participant states understanding of desired cholesterol values and is compliant with medications prescribed. Participant is following exercise prescription and nutrition guidelines.;Long Term: Cholesterol controlled with medications as prescribed, with individualized exercise RX and with personalized nutrition plan. Value goals: LDL < 70mg52mL > 40 mg.      Core Components/Risk Factors/Patient Goals Review:      Goals and Risk Factor Review    Row Name 05/02/17 0728             Core Components/Risk Factors/Patient Goals Review   Personal Goals Review Weight Management/Obesity;Hypertension;Lipids;Diabetes       Review pt with multiple risk factors demonstrates eagerness to participate in CR activities.       Expected Outcomes pt will participate in CR exercise, nutrition and lifestyle modification activities to decrease overall CAD risk factors           Core Components/Risk Factors/Patient Goals at Discharge (Final Review):      Goals and Risk Factor Review - 05/02/17 0728      Core Components/Risk Factors/Patient Goals Review   Personal Goals Review Weight Management/Obesity;Hypertension;Lipids;Diabetes   Review pt with multiple risk factors demonstrates eagerness to participate in CR activities.   Expected Outcomes pt will participate in CR exercise,  nutrition and lifestyle modification activities to decrease overall CAD risk factors       ITP Comments:     ITP Comments    Row Name 04/18/17 0854 05/02/17 0726         ITP Comments Medical Director; Dr. TraciFransico Himcal Director; Dr. TraciFransico Him    Comments: Pt is making expected progress toward personal goals after completing 4 sessions. Recommend continued exercise and life style modification education including  stress management and relaxation techniques to decrease cardiac risk profile.

## 2017-05-05 ENCOUNTER — Encounter (HOSPITAL_COMMUNITY): Payer: 59

## 2017-05-05 ENCOUNTER — Encounter (HOSPITAL_COMMUNITY)
Admission: RE | Admit: 2017-05-05 | Discharge: 2017-05-05 | Disposition: A | Discharge status: Home / Self Care | Payer: 59 | Source: Ambulatory Visit | Attending: Interventional Cardiology | Admitting: Interventional Cardiology

## 2017-05-05 DIAGNOSIS — Z955 Presence of coronary angioplasty implant and graft: Secondary | ICD-10-CM

## 2017-05-05 DIAGNOSIS — Z713 Dietary counseling and surveillance: Secondary | ICD-10-CM | POA: Diagnosis not present

## 2017-05-08 ENCOUNTER — Encounter (HOSPITAL_COMMUNITY): Payer: 59

## 2017-05-08 ENCOUNTER — Encounter (HOSPITAL_COMMUNITY)
Admission: RE | Admit: 2017-05-08 | Discharge: 2017-05-08 | Disposition: A | Discharge status: Home / Self Care | Payer: 59 | Source: Ambulatory Visit | Attending: Interventional Cardiology | Admitting: Interventional Cardiology

## 2017-05-08 DIAGNOSIS — Z955 Presence of coronary angioplasty implant and graft: Secondary | ICD-10-CM

## 2017-05-10 ENCOUNTER — Encounter (HOSPITAL_COMMUNITY): Payer: 59

## 2017-05-10 ENCOUNTER — Telehealth (HOSPITAL_COMMUNITY): Payer: Self-pay | Admitting: Family Medicine

## 2017-05-11 DIAGNOSIS — D510 Vitamin B12 deficiency anemia due to intrinsic factor deficiency: Secondary | ICD-10-CM | POA: Diagnosis not present

## 2017-05-12 ENCOUNTER — Encounter (HOSPITAL_COMMUNITY)
Admission: RE | Admit: 2017-05-12 | Discharge: 2017-05-12 | Disposition: A | Discharge status: Home / Self Care | Payer: 59 | Source: Ambulatory Visit | Attending: Interventional Cardiology | Admitting: Interventional Cardiology

## 2017-05-12 ENCOUNTER — Encounter (HOSPITAL_COMMUNITY): Payer: 59

## 2017-05-12 DIAGNOSIS — Z713 Dietary counseling and surveillance: Secondary | ICD-10-CM | POA: Diagnosis not present

## 2017-05-12 DIAGNOSIS — Z955 Presence of coronary angioplasty implant and graft: Secondary | ICD-10-CM

## 2017-05-15 ENCOUNTER — Encounter (HOSPITAL_COMMUNITY): Payer: 59

## 2017-05-17 ENCOUNTER — Encounter (HOSPITAL_COMMUNITY): Payer: 59

## 2017-05-17 ENCOUNTER — Encounter (HOSPITAL_COMMUNITY)
Admission: RE | Admit: 2017-05-17 | Discharge: 2017-05-17 | Disposition: A | Discharge status: Home / Self Care | Payer: 59 | Source: Ambulatory Visit | Attending: Interventional Cardiology | Admitting: Interventional Cardiology

## 2017-05-17 DIAGNOSIS — Z713 Dietary counseling and surveillance: Secondary | ICD-10-CM | POA: Diagnosis not present

## 2017-05-17 DIAGNOSIS — Z955 Presence of coronary angioplasty implant and graft: Secondary | ICD-10-CM

## 2017-05-19 ENCOUNTER — Encounter (HOSPITAL_COMMUNITY)
Admission: RE | Admit: 2017-05-19 | Discharge: 2017-05-19 | Disposition: A | Discharge status: Home / Self Care | Payer: 59 | Source: Ambulatory Visit | Attending: Interventional Cardiology | Admitting: Interventional Cardiology

## 2017-05-19 ENCOUNTER — Encounter (HOSPITAL_COMMUNITY): Payer: 59

## 2017-05-19 DIAGNOSIS — Z713 Dietary counseling and surveillance: Secondary | ICD-10-CM | POA: Diagnosis not present

## 2017-05-19 DIAGNOSIS — Z955 Presence of coronary angioplasty implant and graft: Secondary | ICD-10-CM

## 2017-05-19 LAB — GLUCOSE, CAPILLARY: Glucose-Capillary: 254 mg/dL — ABNORMAL HIGH (ref 65–99)

## 2017-05-19 NOTE — Progress Notes (Signed)
Troy Martinez 56 y.o. male DOB: [birth year] MRN: [id number]      Nutrition Note  1. 04/06/17 Status post coronary artery stent placement    Meds reviewed. Glimepiride, Janumet noted  Nutrition Note Spoke with pt. Nutrition plan and goals reviewed with pt. Pt is following Step 2 of the Therapeutic Lifestyle Changes diet. Pt wants to lose wt. Pt appears to be maintaining his wt. Barriers to wt loss include pt's 2-3 cups of french vanilla coffee at work (220 kcal/cup) and pt eats out for lunch most days of the week. Pt ate 2 pop tarts for breakfast this morning "because I was in a rush." Pt typically eats instant oatmeal or a breakfast bar and an egg. Pt c/o "craving salt." Pt adds salt to ice and vinegar. Reason behind limiting sodium intake reviewed. Nutrition quality of food choices discussed. Pt plans on preparing lunches for work to help improve his diet. Pt's DM test reviewed with pt. Pt expressed understanding of the information reviewed. Pt aware of nutrition education classes offered and plans on attending nutrition classes.  Nutrition Diagnosis ? Food-and nutrition-related knowledge deficit related to lack of exposure to information as related to diagnosis of: ? CVD ? DM ? Obesity related to excessive energy intake as evidenced by a BMI of 31.3  Nutrition Intervention ? Pt's individual nutrition plan and goals reviewed with pt. ? Pt to start looking at food labels and sodium intake.  ? Pt to start taking lunch to work  Nutrition Goal(s):  ? Pt to identify food quantities necessary to achieve weight loss of 6-24 lb (2.7-10.9 kg) at graduation from cardiac rehab. Goal wt of 170 lb desired.  ? CBG concentrations in the normal range or as close to normal as is safely possible.  Plan:  Pt to attend nutrition classes ? Nutrition I ? Nutrition II ? Portion Distortion ? Diabetes Blitz ? Diabetes Q & A Will provide client-centered nutrition education as part of interdisciplinary care.    Monitor and evaluate progress toward nutrition goal with team.  Mickle PlumbEdna Davian Wollenberg, M.Ed, RD, LDN, CDE 05/19/2017 10:19 AM

## 2017-05-22 ENCOUNTER — Other Ambulatory Visit: Payer: Self-pay | Admitting: Interventional Cardiology

## 2017-05-24 ENCOUNTER — Encounter (HOSPITAL_COMMUNITY): Payer: 59

## 2017-05-24 ENCOUNTER — Encounter (HOSPITAL_COMMUNITY)
Admission: RE | Admit: 2017-05-24 | Discharge: 2017-05-24 | Disposition: A | Discharge status: Home / Self Care | Payer: 59 | Source: Ambulatory Visit | Attending: Interventional Cardiology | Admitting: Interventional Cardiology

## 2017-05-24 DIAGNOSIS — E669 Obesity, unspecified: Secondary | ICD-10-CM | POA: Diagnosis not present

## 2017-05-24 DIAGNOSIS — Z713 Dietary counseling and surveillance: Secondary | ICD-10-CM | POA: Insufficient documentation

## 2017-05-24 DIAGNOSIS — E119 Type 2 diabetes mellitus without complications: Secondary | ICD-10-CM | POA: Diagnosis not present

## 2017-05-24 DIAGNOSIS — I10 Essential (primary) hypertension: Secondary | ICD-10-CM | POA: Insufficient documentation

## 2017-05-24 DIAGNOSIS — I2510 Atherosclerotic heart disease of native coronary artery without angina pectoris: Secondary | ICD-10-CM | POA: Diagnosis not present

## 2017-05-24 DIAGNOSIS — M1990 Unspecified osteoarthritis, unspecified site: Secondary | ICD-10-CM | POA: Diagnosis not present

## 2017-05-24 DIAGNOSIS — Z955 Presence of coronary angioplasty implant and graft: Secondary | ICD-10-CM | POA: Diagnosis not present

## 2017-05-24 DIAGNOSIS — K219 Gastro-esophageal reflux disease without esophagitis: Secondary | ICD-10-CM | POA: Diagnosis not present

## 2017-05-24 DIAGNOSIS — E7800 Pure hypercholesterolemia, unspecified: Secondary | ICD-10-CM | POA: Insufficient documentation

## 2017-05-24 NOTE — Progress Notes (Signed)
Reviewed home exercise with pt today.  Pt plans to walk and use [employer] gym for exercise, 3x/week in addition to coming to [facility].  Reviewed THR, pulse, RPE, sign and symptoms, NTG use, and when to call 911 or MD.  Also discussed weather considerations and indoor options.  Pt voiced understanding.    Merritt [employer],MS,ACSM RCEP

## 2017-05-26 ENCOUNTER — Encounter (HOSPITAL_COMMUNITY): Payer: 59

## 2017-05-26 ENCOUNTER — Encounter (HOSPITAL_COMMUNITY)
Admission: RE | Admit: 2017-05-26 | Discharge: 2017-05-26 | Disposition: A | Discharge status: Home / Self Care | Payer: 59 | Source: Ambulatory Visit | Attending: Interventional Cardiology | Admitting: Interventional Cardiology

## 2017-05-26 DIAGNOSIS — Z713 Dietary counseling and surveillance: Secondary | ICD-10-CM | POA: Diagnosis not present

## 2017-05-26 DIAGNOSIS — Z955 Presence of coronary angioplasty implant and graft: Secondary | ICD-10-CM

## 2017-05-29 ENCOUNTER — Encounter (HOSPITAL_COMMUNITY): Payer: 59

## 2017-05-29 ENCOUNTER — Encounter (HOSPITAL_COMMUNITY)
Admission: RE | Admit: 2017-05-29 | Discharge: 2017-05-29 | Disposition: A | Discharge status: Home / Self Care | Payer: 59 | Source: Ambulatory Visit | Attending: Interventional Cardiology | Admitting: Interventional Cardiology

## 2017-05-29 DIAGNOSIS — Z955 Presence of coronary angioplasty implant and graft: Secondary | ICD-10-CM

## 2017-05-29 DIAGNOSIS — Z713 Dietary counseling and surveillance: Secondary | ICD-10-CM | POA: Diagnosis not present

## 2017-05-31 ENCOUNTER — Encounter (HOSPITAL_COMMUNITY)
Admission: RE | Admit: 2017-05-31 | Discharge: 2017-05-31 | Disposition: A | Discharge status: Home / Self Care | Payer: 59 | Source: Ambulatory Visit | Attending: Interventional Cardiology | Admitting: Interventional Cardiology

## 2017-05-31 ENCOUNTER — Encounter (HOSPITAL_COMMUNITY): Payer: 59

## 2017-05-31 DIAGNOSIS — Z713 Dietary counseling and surveillance: Secondary | ICD-10-CM | POA: Diagnosis not present

## 2017-05-31 DIAGNOSIS — Z955 Presence of coronary angioplasty implant and graft: Secondary | ICD-10-CM

## 2017-05-31 NOTE — Progress Notes (Signed)
Cardiac Individual Treatment Plan  Patient Details  Name: Troy Martinez MRN: [id number] Date of Birth: 09-10-1961 Referring Provider:     CARDIAC REHAB PHASE II ORIENTATION from 04/18/2017 in [place]  Referring Provider  Casandra Doffing MD      Initial Encounter Date:    CARDIAC REHAB PHASE II ORIENTATION from 04/18/2017 in [facility]  Date  04/18/17  Referring Provider  Casandra Doffing MD      Visit Diagnosis: 04/06/17 Status post coronary artery stent placement  Patient's Home Medications on Admission:  Current Outpatient Prescriptions:  .  acetaminophen (TYLENOL) 500 MG tablet, Take 2 tablets (1,000 mg total) by mouth daily as needed for moderate pain or headache., Disp: , Rfl:  .  APPLE CIDER VINEGAR PO, Take 30 mLs by mouth at bedtime., Disp: , Rfl:  .  Ascorbic Acid (VITAMIN C) 500 MG CHEW, Chew 250 mg by mouth daily., Disp: , Rfl:  .  aspirin EC 81 MG tablet, Take 1 tablet (81 mg total) by mouth daily., Disp: 30 tablet, Rfl: 0 .  atorvastatin (LIPITOR) 80 MG tablet, Take 80 mg by mouth at bedtime., Disp: , Rfl:  .  buPROPion (WELLBUTRIN SR) 150 MG 12 hr tablet, Take 150 mg by mouth 2 (two) times daily., Disp: , Rfl: 1 .  Cyanocobalamin (B-12 COMPLIANCE INJECTION) 1000 MCG/ML KIT, Inject as directed every 30 (thirty) days. , Disp: , Rfl:  .  esomeprazole (NEXIUM) 20 MG capsule, Take 20 mg by mouth daily at 12 noon., Disp: , Rfl:  .  Fe-Succ Ac-C-Thre Ac-B12-FA (FERREX 150 FORTE PLUS) 50-100 MG CAPS, Take 1 capsule by mouth every evening. , Disp: , Rfl:  .  fenofibrate 160 MG tablet, Take 160 mg by mouth every evening. , Disp: , Rfl:  .  gabapentin (NEURONTIN) 300 MG capsule, Take 1 capsule (300 mg) by mouth each morning and 2 capsule (600 mg) by mouth each evening., Disp: , Rfl:  .  glimepiride (AMARYL) 1 MG tablet, Take 0.5 mg by mouth daily with breakfast. , Disp: , Rfl:  .  hydrocortisone cream 1 %, Apply 1  application topically daily as needed for itching., Disp: , Rfl:  .  JANUMET XR 50-1000 MG TB24, Take 1 tablet by mouth 2 (two) times daily. , Disp: , Rfl: 3 .  lisinopril (PRINIVIL,ZESTRIL) 10 MG tablet, Take 5 mg by mouth daily., Disp: , Rfl: 12 .  Multiple Vitamins-Minerals (MULTIVITAMIN PO), Take 1 tablet by mouth daily., Disp: , Rfl:  .  nebivolol (BYSTOLIC) 5 MG tablet, Take 5 mg by mouth every evening. , Disp: , Rfl:  .  nitroGLYCERIN (NITROSTAT) 0.4 MG SL tablet, Place 0.4 mg under the tongue every 5 (five) minutes as needed for chest pain. 3 DOSES MAX, Disp: , Rfl:  .  ONE TOUCH ULTRA TEST test strip, 1 each. , Disp: , Rfl:  .  prasugrel (EFFIENT) 10 MG TABS tablet, TAKE 1 TABLET BY MOUTH EVERY DAY, Disp: 30 tablet, Rfl: 5 .  venlafaxine XR (EFFEXOR-XR) 75 MG 24 hr capsule, Take 75 mg by mouth every evening. , Disp: , Rfl: 2  Past Medical History: Past Medical History:  Diagnosis Date  . Arthritis   . Coronary artery disease    a. DES to LCx (2008)  b. DES to RCA (2012) c. Overlapping DESx2 to large ramus (03/2014). d. [place] 03/2017 s/p cutting ballon angioplasty to ramus 2.  . GERD (gastroesophageal reflux disease)   .  High cholesterol   . Hypertension   . Obesity   . Type II diabetes mellitus (HCC)     Tobacco Use: History  Smoking Status  . Former Smoker  . Packs/day: 3.00  . Years: 23.00  . Types: Cigarettes  . Quit date: 01/18/1999  Smokeless Tobacco  . Never Used    Labs: Recent Review Flowsheet Data    Labs for ITP Cardiac and Pulmonary Rehab Latest Ref Rng & Units 07/08/2008 12/01/2016   Cholestrol 100 - 199 mg/dL - 123   LDLCALC 0 - 99 mg/dL - 61   HDL >39 mg/dL - 24(L)   Trlycerides 0 - 149 mg/dL - 192(H)   TCO2 - 28 -      Capillary Blood Glucose: Lab Results  Component Value Date   GLUCAP 254 (H) 05/19/2017   GLUCAP 149 (H) 05/01/2017   GLUCAP 139 (H) 04/26/2017   GLUCAP 180 (H) 04/26/2017   GLUCAP 104 (H) 04/06/2017     Exercise Target Goals:     Exercise Program Goal: Individual exercise prescription set with THRR, safety & activity barriers. Participant demonstrates ability to understand and report RPE using BORG scale, to self-measure pulse accurately, and to acknowledge the importance of the exercise prescription.  Exercise Prescription Goal: Starting with aerobic activity 30 plus minutes a day, 3 days per week for initial exercise prescription. Provide home exercise prescription and guidelines that participant acknowledges understanding prior to discharge.  Activity Barriers & Risk Stratification:     Activity Barriers & Cardiac Risk Stratification - 04/18/17 0857      Activity Barriers & Cardiac Risk Stratification   Activity Barriers Back Problems;Other (comment)   Comments R knee discomfort   Cardiac Risk Stratification High      6 Minute Walk:     6 Minute Walk    Row Name 04/18/17 1144         6 Minute Walk   Phase Initial     Distance 1863 feet     Walk Time 6 minutes     # of Rest Breaks 0     MPH 3.53     METS 4.5     RPE 11     VO2 Peak 15.6     Symptoms Yes (comment)     Comments heavier breathing at end of walk test     Resting HR 67 bpm     Resting BP 128/60     Max Ex. HR 103 bpm     Max Ex. BP 122/72     2 Minute Post BP 116/66        Oxygen Initial Assessment:   Oxygen Re-Evaluation:   Oxygen Discharge (Final Oxygen Re-Evaluation):   Initial Exercise Prescription:     Initial Exercise Prescription - 04/18/17 1100      Date of Initial Exercise RX and Referring Provider   Date 04/18/17   Referring Provider Casandra Doffing MD     Treadmill   MPH 2.8   Grade 1   Minutes 10   METs 3.53     Bike   Level 1.2   Minutes 10   METs 3.38     NuStep   Level 3   SPM 80   Minutes 10   METs 2.5     Prescription Details   Frequency (times per week) 3   Duration Progress to 30 minutes of continuous aerobic without signs/symptoms of physical distress     Intensity   THRR  40-80% of  Max Heartrate 66-132   Ratings of Perceived Exertion 11-15   Perceived Dyspnea 0-4     Progression   Progression Continue to progress workloads to maintain intensity without signs/symptoms of physical distress.     Resistance Training   Training Prescription Yes   Weight 3lbs   Reps 10-15      Perform Capillary Blood Glucose checks as needed.  Exercise Prescription Changes:     Exercise Prescription Changes    Row Name 04/26/17 1131 05/18/17 0800 05/29/17 1600         Response to Exercise   Blood Pressure (Admit) 128/72 134/64 124/74     Blood Pressure (Exercise) 144/78 164/70 150/70     Blood Pressure (Exit) 118/70 104/60 108/70     Heart Rate (Admit) 81 bpm 91 bpm 84 bpm     Heart Rate (Exercise) 110 bpm 126 bpm 121 bpm     Heart Rate (Exit) 81 bpm 88 bpm 91 bpm     Rating of Perceived Exertion (Exercise) _0 Symptoms none  none  low back pain. did wear back brace to manage pain with activity     Comments Pt was oriented to exercise equiment. Pt did well with exercise  -  -     Duration Continue with 30 min of aerobic exercise without signs/symptoms of physical distress. Continue with 30 min of aerobic exercise without signs/symptoms of physical distress. Continue with 30 min of aerobic exercise without signs/symptoms of physical distress.     Intensity THRR unchanged THRR unchanged THRR unchanged       Progression   Progression Continue to progress workloads to maintain intensity without signs/symptoms of physical distress. Continue to progress workloads to maintain intensity without signs/symptoms of physical distress. Continue to progress workloads to maintain intensity without signs/symptoms of physical distress.     Average METs 3.8 5.1 5.4       Resistance Training   Training Prescription Yes Yes Yes     Weight 5lbs 8lbs 8lbs     Reps 10-15 10-15 10-15     Time 10 Minutes 10 Minutes 10 Minutes       Treadmill   MPH 2.8 3 3.2     Grade _1 Minutes _2 METs 3.53 4.12 4.33       Bike   Level 1.2 2 2.5     Minutes _3 METs 3.38 5.13 6.17       NuStep   Level _4 SPM 90 100 95     Minutes _5 METs 3.9 6.1 5.7       Home Exercise Plan   Plans to continue exercise at  -  - Home (comment)  walking and using total gym     Frequency  -  - Add 2 additional days to program exercise sessions.     Initial Home Exercises Provided  -  - 05/24/17        Exercise Comments:     Exercise Comments    Row Name 05/02/17 1134 05/30/17 0819         Exercise Comments Pt was oriented to exercise equipment on 04/26/17. Pt responded well to exercise sessioin and reported no signs/symptoms of CP, dizziness or unusual SOB. Reviewed METs and goals. Pt is tolerating exercise very well; will continue  to monitor pt's progress and activity levels.         Exercise Goals and Review:     Exercise Goals    Row Name 04/18/17 0859             Exercise Goals   Increase Physical Activity Yes       Intervention Provide advice, education, support and counseling about physical activity/exercise needs.;Develop an individualized exercise prescription for aerobic and resistive training based on initial evaluation findings, risk stratification, comorbidities and participant's personal goals.       Expected Outcomes Achievement of increased cardiorespiratory fitness and enhanced flexibility, muscular endurance and strength shown through measurements of functional capacity and personal statement of participant.       Increase Strength and Stamina Yes       Intervention Provide advice, education, support and counseling about physical activity/exercise needs.;Develop an individualized exercise prescription for aerobic and resistive training based on initial evaluation findings, risk stratification, comorbidities and participant's personal goals.       Expected Outcomes Achievement of increased cardiorespiratory fitness  and enhanced flexibility, muscular endurance and strength shown through measurements of functional capacity and personal statement of participant.          Exercise Goals Re-Evaluation :     Exercise Goals Re-Evaluation    Row Name 05/24/17 1605             Exercise Goal Re-Evaluation   Exercise Goals Review Increase Physical Activity;Able to understand and use rate of perceived exertion (RPE) scale;Knowledge and understanding of Target Heart Rate Range (THRR);Understanding of Exercise Prescription;Increase Strength and Stamina       Comments Reviewed home exercise with pt today.  Pt plans to walk and use total gym for exercise, 3x/week in addition to coming to cardiac rehan.  Reviewed THR, pulse, RPE, sign and symptoms, NTG use, and when to call 911 or MD.  Also discussed weather considerations and indoor options.  Pt voiced understanding.       Expected Outcomes Pt will be compliant with HEP and improve in cardiorespiratory fitness and functional capacity.           Discharge Exercise Prescription (Final Exercise Prescription Changes):     Exercise Prescription Changes - 05/29/17 1600      Response to Exercise   Blood Pressure (Admit) 124/74   Blood Pressure (Exercise) 150/70   Blood Pressure (Exit) 108/70   Heart Rate (Admit) 84 bpm   Heart Rate (Exercise) 121 bpm   Heart Rate (Exit) 91 bpm   Rating of Perceived Exertion (Exercise) 13   Symptoms low back pain. did wear back brace to manage pain with activity   Duration Continue with 30 min of aerobic exercise without signs/symptoms of physical distress.   Intensity THRR unchanged     Progression   Progression Continue to progress workloads to maintain intensity without signs/symptoms of physical distress.   Average METs 5.4     Resistance Training   Training Prescription Yes   Weight 8lbs   Reps 10-15   Time 10 Minutes     Treadmill   MPH 3.2   Grade 2   Minutes 10   METs 4.33     Bike   Level 2.5   Minutes  10   METs 6.17     NuStep   Level 5   SPM 95   Minutes 10   METs 5.7     Home Exercise Plan   Plans to continue exercise at Home (  comment)  walking and using total gym   Frequency Add 2 additional days to program exercise sessions.   Initial Home Exercises Provided 05/24/17      Nutrition:  Target Goals: Understanding of nutrition guidelines, daily intake of sodium <1527m, cholesterol <2062m calories 30% from fat and 7% or less from saturated fats, daily to have 5 or more servings of fruits and vegetables.  Biometrics:     Pre Biometrics - 04/18/17 1149      Pre Biometrics   Waist Circumference 43 inches   Hip Circumference 42.5 inches   Waist to Hip Ratio 1.01 %   Triceps Skinfold 25 mm   % Body Fat 32 %   Grip Strength 51 kg   Flexibility 12 in   Single Leg Stand 14.21 seconds       Nutrition Therapy Plan and Nutrition Goals:     Nutrition Therapy & Goals - 04/18/17 1207      Nutrition Therapy   Diet Carb Modified, Therapeutic Lifestyle Changes     Personal Nutrition Goals   Nutrition Goal Pt to identify food quantities necessary to achieve weight loss of 6-24 lb (2.7-10.9 kg) at graduation from cardiac rehab. Goal wt of 170 lb desired.    Personal Goal #2 CBG concentrations in the normal range or as close to normal as is safely possible.     Intervention Plan   Intervention Prescribe, educate and counsel regarding individualized specific dietary modifications aiming towards targeted core components such as weight, hypertension, lipid management, diabetes, heart failure and other comorbidities.   Expected Outcomes Short Term Goal: Understand basic principles of dietary content, such as calories, fat, sodium, cholesterol and nutrients.;Long Term Goal: Adherence to prescribed nutrition plan.      Nutrition Discharge: Nutrition Scores:     Nutrition Assessments - 04/18/17 1128      MEDFICTS Scores   Pre Score 32      Nutrition Goals  Re-Evaluation:   Nutrition Goals Re-Evaluation:   Nutrition Goals Discharge (Final Nutrition Goals Re-Evaluation):   Psychosocial: Target Goals: Acknowledge presence or absence of significant depression and/or stress, maximize coping skills, provide positive support system. Participant is able to verbalize types and ability to use techniques and skills needed for reducing stress and depression.  Initial Review & Psychosocial Screening:     Initial Psych Review & Screening - 04/28/17 1208      Initial Review   Current issues with None Identified     Family Dynamics   Good Support System? Yes   Comments no psychosocial needs identified, no interventions necessary      Barriers   Psychosocial barriers to participate in program There are no identifiable barriers or psychosocial needs.     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 pt has learned how to cope. pt also sees therapist for depression which is currenlty stable.    GLOBAL Pre --  overall pt concerns are depression related, pt states symptoms are presently well managed and verablizes when to contact therapist/psychiatry if symptoms change.  pt offered emotional support and reassurance.      PHQ-9: Recent Review Flowsheet Data    [facility] 04/28/2017 09/22/2016   Decreased  Interest 0 0   Down, Depressed, Hopeless 0  0   PHQ - 2 Score 0 0     Interpretation of Total Score  Total Score Depression Severity:  1-4 = Minimal depression, 5-9 = Mild depression, 10-14 = Moderate depression, 15-19 = Moderately severe depression, 20-27 = Severe depression   Psychosocial Evaluation and Intervention:     Psychosocial Evaluation - 04/28/17 1212      Psychosocial  Evaluation & Interventions   Interventions Stress management education;Relaxation education;Encouraged to exercise with the program and follow exercise prescription   Comments pt with history of depression currently well managed with effoxor, wellbutrin and therapy   Expected Outcomes pt will exhibit maintenance of well managed depression, positive outlook and good coping skills    Continue Psychosocial Services  Follow up required by staff      Psychosocial Re-Evaluation:     Psychosocial Re-Evaluation    [place] Name 05/02/17 0731 05/31/17 1111           Psychosocial Re-Evaluation   Current issues with History of Depression;Current Depression History of Depression;Current Depression      Comments pt with known history of depression, symptoms well managed on current treatment.   pt with known history of depression, symptoms well managed on current treatment.        Expected Outcomes pt will demonstrate good coping skills with positive outlook. pt will demonstrate good coping skills with positive outlook.      Interventions Relaxation education;Stress management education;Encouraged to attend Cardiac Rehabilitation for the exercise Relaxation education;Stress management education;Encouraged to attend Cardiac Rehabilitation for the exercise      Continue Psychosocial Services  Follow up required by staff Follow up required by staff         Psychosocial Discharge (Final Psychosocial Re-Evaluation):     Psychosocial Re-Evaluation - 05/31/17 1111      Psychosocial Re-Evaluation   Current issues with History of Depression;Current Depression   Comments pt with known history of depression, symptoms well managed on current treatment.     Expected Outcomes pt will demonstrate good coping skills with positive outlook.   Interventions Relaxation education;Stress management education;Encouraged to attend Cardiac Rehabilitation for the exercise   Continue Psychosocial Services  Follow up required  by staff      Vocational Rehabilitation: Provide vocational rehab assistance to qualifying candidates.   Vocational Rehab Evaluation & Intervention:     Vocational Rehab - 04/28/17 1207      Initial Vocational Rehab Evaluation & Intervention   Assessment shows need for Vocational Rehabilitation No      Education: Education Goals: Education classes will be provided on a weekly basis, covering required topics. Participant will state understanding/return demonstration of topics presented.  Learning Barriers/Preferences:     Learning Barriers/Preferences - 04/18/17 0859      Learning Barriers/Preferences   Learning Preferences Skilled Demonstration;Written Material;Computer/Internet      Education Topics: Count Your Pulse:  -Group instruction provided by verbal instruction, demonstration, patient participation and written materials to support subject.  Instructors address importance of being able to find your pulse and how to count your pulse when at home without a heart monitor.  Patients get hands on experience counting their pulse with staff help and individually.   CARDIAC REHAB PHASE II EXERCISE from 05/31/2017 in [facility]  Date  05/26/17  Instruction Review Code  2- meets goals/outcomes      Heart Attack, Angina, and Risk Factor Modification:  -Group instruction provided  by verbal instruction, video, and written materials to support subject.  Instructors address signs and symptoms of angina and heart attacks.    Also discuss risk factors for heart disease and how to make changes to improve heart health risk factors.   Functional Fitness:  -Group instruction provided by verbal instruction, demonstration, patient participation, and written materials to support subject.  Instructors address safety measures for doing things around the house.  Discuss how to get up and down off the floor, how to pick things up properly, how to safely get out  of a chair without assistance, and balance training.   CARDIAC REHAB PHASE II EXERCISE from 05/31/2017 in [place]  Date  05/05/17  Instruction Review Code  2- meets goals/outcomes      Meditation and Mindfulness:  -Group instruction provided by verbal instruction, patient participation, and written materials to support subject.  Instructor addresses importance of mindfulness and meditation practice to help reduce stress and improve awareness.  Instructor also leads participants through a meditation exercise.    CARDIAC REHAB PHASE II EXERCISE from 05/31/2017 in [facility]  Date  05/03/17  Instruction Review Code  2- meets goals/outcomes      Stretching for Flexibility and Mobility:  -Group instruction provided by verbal instruction, patient participation, and written materials to support subject.  Instructors lead participants through series of stretches that are designed to increase flexibility thus improving mobility.  These stretches are additional exercise for major muscle groups that are typically performed during regular warm up and cool down.   CARDIAC REHAB PHASE II EXERCISE from 05/31/2017 in [facility]  Date  05/12/17  Instruction Review Code  2- meets goals/outcomes      Hands Only CPR:  -Group verbal, video, and participation provides a basic overview of AHA guidelines for community CPR. Role-play of emergencies allow participants the opportunity to practice calling for help and chest compression technique with discussion of AED use.   Hypertension: -Group verbal and written instruction that provides a basic overview of hypertension including the most recent diagnostic guidelines, risk factor reduction with self-care instructions and medication management.    Nutrition I class: Heart Healthy Eating:  -Group instruction provided by PowerPoint slides, verbal discussion, and  written materials to support subject matter. The instructor gives an explanation and review of the Therapeutic Lifestyle Changes diet recommendations, which includes a discussion on lipid goals, dietary fat, sodium, fiber, plant stanol/sterol esters, sugar, and the components of a well-balanced, healthy diet.   Nutrition II class: Lifestyle Skills:  -Group instruction provided by PowerPoint slides, verbal discussion, and written materials to support subject matter. The instructor gives an explanation and review of label reading, grocery shopping for heart health, heart healthy recipe modifications, and ways to make healthier choices when eating out.   Diabetes Question & Answer:  -Group instruction provided by PowerPoint slides, verbal discussion, and written materials to support subject matter. The instructor gives an explanation and review of diabetes co-morbidities, pre- and post-prandial blood glucose goals, pre-exercise blood glucose goals, signs, symptoms, and treatment of hypoglycemia and hyperglycemia, and foot care basics.   Diabetes Blitz:  -Group instruction provided by PowerPoint slides, verbal discussion, and written materials to support subject matter. The instructor gives an explanation and review of the physiology behind type 1 and type 2 diabetes, diabetes medications and rational behind using different medications, pre- and post-prandial blood glucose recommendations and Hemoglobin A1c goals, diabetes diet,  and exercise including blood glucose guidelines for exercising safely.    Portion Distortion:  -Group instruction provided by PowerPoint slides, verbal discussion, written materials, and food models to support subject matter. The instructor gives an explanation of serving size versus portion size, changes in portions sizes over the last 20 years, and what consists of a serving from each food group.   Stress Management:  -Group instruction provided by verbal instruction,  video, and written materials to support subject matter.  Instructors review role of stress in heart disease and how to cope with stress positively.     CARDIAC REHAB PHASE II EXERCISE from 05/31/2017 in [place]  Date  05/17/17  Instruction Review Code  2- meets goals/outcomes      Exercising on Your Own:  -Group instruction provided by verbal instruction, power point, and written materials to support subject.  Instructors discuss benefits of exercise, components of exercise, frequency and intensity of exercise, and end points for exercise.  Also discuss use of nitroglycerin and activating EMS.  Review options of places to exercise outside of rehab.  Review guidelines for sex with heart disease.   CARDIAC REHAB PHASE II EXERCISE from 05/31/2017 in [place]  Date  05/24/17  Instruction Review Code  2- meets goals/outcomes      Cardiac Drugs I:  -Group instruction provided by verbal instruction and written materials to support subject.  Instructor reviews cardiac drug classes: antiplatelets, anticoagulants, beta blockers, and statins.  Instructor discusses reasons, side effects, and lifestyle considerations for each drug class.   CARDIAC REHAB PHASE II EXERCISE from 05/31/2017 in [place]  Date  05/31/17  Educator  Kennyth Lose  Instruction Review Code  2- meets goals/outcomes      Cardiac Drugs II:  -Group instruction provided by verbal instruction and written materials to support subject.  Instructor reviews cardiac drug classes: angiotensin converting enzyme inhibitors (ACE-I), angiotensin II receptor blockers (ARBs), nitrates, and calcium channel blockers.  Instructor discusses reasons, side effects, and lifestyle considerations for each drug class.   Anatomy and Physiology of the Circulatory System:  Group verbal and written instruction and models provide basic cardiac anatomy and physiology,  with the coronary electrical and arterial systems. Review of: AMI, Angina, Valve disease, Heart Failure, Peripheral Artery Disease, Cardiac Arrhythmia, Pacemakers, and the ICD.   Other Education:  -Group or individual verbal, written, or video instructions that support the educational goals of the cardiac rehab program.   Knowledge Questionnaire Score:     Knowledge Questionnaire Score - 04/28/17 1206      Knowledge Questionnaire Score   Pre Score 20/24 cardiac, DM 13/15      Core Components/Risk Factors/Patient Goals at Admission:     Personal Goals and Risk Factors at Admission - 04/18/17 1150      Core Components/Risk Factors/Patient Goals on Admission   Diabetes Yes   Intervention Provide education about signs/symptoms and action to take for hypo/hyperglycemia.;Provide education about proper nutrition, including hydration, and aerobic/resistive exercise prescription along with prescribed medications to achieve blood glucose in normal ranges: Fasting glucose 65-99 mg/dL   Expected Outcomes Short Term: Participant verbalizes understanding of the signs/symptoms and immediate care of hyper/hypoglycemia, proper foot care and importance of medication, aerobic/resistive exercise and nutrition plan for blood glucose control.;Long Term: Attainment of HbA1C < 7%.   Hypertension Yes   Intervention Provide education on lifestyle modifcations including regular physical activity/exercise, weight management, moderate sodium restriction and  increased consumption of fresh fruit, vegetables, and low fat dairy, alcohol moderation, and smoking cessation.;Monitor prescription use compliance.   Expected Outcomes Short Term: Continued assessment and intervention until BP is < 140/73m HG in hypertensive participants. < 130/876mHG in hypertensive participants with diabetes, heart failure or chronic kidney disease.;Long Term: Maintenance of blood pressure at goal levels.   Lipids Yes   Intervention Provide  education and support for participant on nutrition & aerobic/resistive exercise along with prescribed medications to achieve LDL <7068mHDL >72m72m Expected Outcomes Short Term: Participant states understanding of desired cholesterol values and is compliant with medications prescribed. Participant is following exercise prescription and nutrition guidelines.;Long Term: Cholesterol controlled with medications as prescribed, with individualized exercise RX and with personalized nutrition plan. Value goals: LDL < 70mg62mL > 40 mg.      Core Components/Risk Factors/Patient Goals Review:      Goals and Risk Factor Review    Row Name 05/02/17 0728 05/31/17 1109           Core Components/Risk Factors/Patient Goals Review   Personal Goals Review Weight Management/Obesity;Hypertension;Lipids;Diabetes Weight Management/Obesity;Hypertension;Lipids;Diabetes      Review pt with multiple risk factors demonstrates eagerness to participate in CR activities. pt with multiple risk factors demonstrates eagerness to participate in CR activities. pt activity progression somewhat limited by back pain.  pt encouraged to continue stretches and core strengthening exercises as prescribed by physical therapy.        Expected Outcomes pt will participate in CR exercise, nutrition and lifestyle modification activities to decrease overall CAD risk factors  pt will participate in CR exercise, nutrition and lifestyle modification activities to decrease overall CAD risk factors          Core Components/Risk Factors/Patient Goals at Discharge (Final Review):      Goals and Risk Factor Review - 05/31/17 1109      Core Components/Risk Factors/Patient Goals Review   Personal Goals Review Weight Management/Obesity;Hypertension;Lipids;Diabetes   Review pt with multiple risk factors demonstrates eagerness to participate in CR activities. pt activity progression somewhat limited by back pain.  pt encouraged to continue  stretches and core strengthening exercises as prescribed by physical therapy.     Expected Outcomes pt will participate in CR exercise, nutrition and lifestyle modification activities to decrease overall CAD risk factors       ITP Comments:     ITP Comments    Row Name 04/18/17 0854 05/02/17 0726 05/31/17 1109       ITP Comments Medical Director; Dr. TraciFransico Himcal Director; Dr. TraciFransico Himcal Director; Dr. TraciFransico Him   Comments: Pt is making expected progress toward personal goals after completing 13 sessions. Recommend continued exercise and life style modification education including  stress management and relaxation techniques to decrease cardiac risk profile.

## 2017-06-02 ENCOUNTER — Encounter (HOSPITAL_COMMUNITY): Payer: 59

## 2017-06-05 ENCOUNTER — Encounter (HOSPITAL_COMMUNITY): Payer: 59

## 2017-06-06 DIAGNOSIS — D510 Vitamin B12 deficiency anemia due to intrinsic factor deficiency: Secondary | ICD-10-CM | POA: Diagnosis not present

## 2017-06-07 ENCOUNTER — Encounter (HOSPITAL_COMMUNITY)
Admission: RE | Admit: 2017-06-07 | Discharge: 2017-06-07 | Disposition: A | Discharge status: Home / Self Care | Payer: 59 | Source: Ambulatory Visit | Attending: Interventional Cardiology | Admitting: Interventional Cardiology

## 2017-06-07 ENCOUNTER — Encounter (HOSPITAL_COMMUNITY): Payer: 59

## 2017-06-07 DIAGNOSIS — Z713 Dietary counseling and surveillance: Secondary | ICD-10-CM | POA: Diagnosis not present

## 2017-06-07 DIAGNOSIS — Z955 Presence of coronary angioplasty implant and graft: Secondary | ICD-10-CM

## 2017-06-09 ENCOUNTER — Encounter (HOSPITAL_COMMUNITY)
Admission: RE | Admit: 2017-06-09 | Discharge: 2017-06-09 | Disposition: A | Discharge status: Home / Self Care | Payer: 59 | Source: Ambulatory Visit | Attending: Interventional Cardiology | Admitting: Interventional Cardiology

## 2017-06-09 ENCOUNTER — Encounter (HOSPITAL_COMMUNITY): Payer: 59

## 2017-06-09 DIAGNOSIS — Z955 Presence of coronary angioplasty implant and graft: Secondary | ICD-10-CM

## 2017-06-09 DIAGNOSIS — Z713 Dietary counseling and surveillance: Secondary | ICD-10-CM | POA: Diagnosis not present

## 2017-06-12 ENCOUNTER — Encounter (HOSPITAL_COMMUNITY)
Admission: RE | Admit: 2017-06-12 | Discharge: 2017-06-12 | Disposition: A | Discharge status: Home / Self Care | Payer: 59 | Source: Ambulatory Visit | Attending: Interventional Cardiology | Admitting: Interventional Cardiology

## 2017-06-12 ENCOUNTER — Encounter (HOSPITAL_COMMUNITY): Payer: 59

## 2017-06-12 DIAGNOSIS — Z955 Presence of coronary angioplasty implant and graft: Secondary | ICD-10-CM

## 2017-06-12 DIAGNOSIS — Z713 Dietary counseling and surveillance: Secondary | ICD-10-CM | POA: Diagnosis not present

## 2017-06-14 ENCOUNTER — Encounter (HOSPITAL_COMMUNITY): Payer: 59

## 2017-06-14 ENCOUNTER — Encounter (HOSPITAL_COMMUNITY)
Admission: RE | Admit: 2017-06-14 | Discharge: 2017-06-14 | Disposition: A | Discharge status: Home / Self Care | Payer: 59 | Source: Ambulatory Visit | Attending: Interventional Cardiology | Admitting: Interventional Cardiology

## 2017-06-14 DIAGNOSIS — Z713 Dietary counseling and surveillance: Secondary | ICD-10-CM | POA: Diagnosis not present

## 2017-06-14 DIAGNOSIS — Z955 Presence of coronary angioplasty implant and graft: Secondary | ICD-10-CM

## 2017-06-16 ENCOUNTER — Encounter (HOSPITAL_COMMUNITY): Payer: 59

## 2017-06-16 ENCOUNTER — Encounter (HOSPITAL_COMMUNITY)
Admission: RE | Admit: 2017-06-16 | Discharge: 2017-06-16 | Disposition: A | Discharge status: Home / Self Care | Payer: 59 | Source: Ambulatory Visit | Attending: Interventional Cardiology | Admitting: Interventional Cardiology

## 2017-06-16 DIAGNOSIS — Z955 Presence of coronary angioplasty implant and graft: Secondary | ICD-10-CM

## 2017-06-16 DIAGNOSIS — Z713 Dietary counseling and surveillance: Secondary | ICD-10-CM | POA: Diagnosis not present

## 2017-06-19 ENCOUNTER — Encounter (HOSPITAL_COMMUNITY): Payer: 59

## 2017-06-19 ENCOUNTER — Other Ambulatory Visit: Payer: Self-pay | Admitting: Interventional Cardiology

## 2017-06-21 ENCOUNTER — Encounter (HOSPITAL_COMMUNITY): Payer: 59

## 2017-06-21 ENCOUNTER — Encounter (HOSPITAL_COMMUNITY)
Admission: RE | Admit: 2017-06-21 | Discharge: 2017-06-21 | Disposition: A | Discharge status: Home / Self Care | Payer: 59 | Source: Ambulatory Visit | Attending: Interventional Cardiology | Admitting: Interventional Cardiology

## 2017-06-21 DIAGNOSIS — K219 Gastro-esophageal reflux disease without esophagitis: Secondary | ICD-10-CM | POA: Diagnosis not present

## 2017-06-21 DIAGNOSIS — Z955 Presence of coronary angioplasty implant and graft: Secondary | ICD-10-CM

## 2017-06-21 DIAGNOSIS — I2510 Atherosclerotic heart disease of native coronary artery without angina pectoris: Secondary | ICD-10-CM | POA: Diagnosis not present

## 2017-06-21 DIAGNOSIS — E7800 Pure hypercholesterolemia, unspecified: Secondary | ICD-10-CM | POA: Diagnosis not present

## 2017-06-21 DIAGNOSIS — Z713 Dietary counseling and surveillance: Secondary | ICD-10-CM | POA: Insufficient documentation

## 2017-06-21 DIAGNOSIS — E119 Type 2 diabetes mellitus without complications: Secondary | ICD-10-CM | POA: Diagnosis not present

## 2017-06-21 DIAGNOSIS — E669 Obesity, unspecified: Secondary | ICD-10-CM | POA: Diagnosis not present

## 2017-06-21 DIAGNOSIS — M1990 Unspecified osteoarthritis, unspecified site: Secondary | ICD-10-CM | POA: Diagnosis not present

## 2017-06-21 DIAGNOSIS — I10 Essential (primary) hypertension: Secondary | ICD-10-CM | POA: Insufficient documentation

## 2017-06-23 ENCOUNTER — Encounter (HOSPITAL_COMMUNITY): Payer: 59

## 2017-06-23 ENCOUNTER — Encounter (HOSPITAL_COMMUNITY)
Admission: RE | Admit: 2017-06-23 | Discharge: 2017-06-23 | Disposition: A | Discharge status: Home / Self Care | Payer: 59 | Source: Ambulatory Visit | Attending: Interventional Cardiology | Admitting: Interventional Cardiology

## 2017-06-23 DIAGNOSIS — Z713 Dietary counseling and surveillance: Secondary | ICD-10-CM | POA: Diagnosis not present

## 2017-06-23 DIAGNOSIS — Z955 Presence of coronary angioplasty implant and graft: Secondary | ICD-10-CM

## 2017-06-26 ENCOUNTER — Encounter (HOSPITAL_COMMUNITY)
Admission: RE | Admit: 2017-06-26 | Discharge: 2017-06-26 | Disposition: A | Discharge status: Home / Self Care | Payer: 59 | Source: Ambulatory Visit | Attending: Interventional Cardiology | Admitting: Interventional Cardiology

## 2017-06-26 ENCOUNTER — Encounter (HOSPITAL_COMMUNITY): Payer: 59

## 2017-06-26 DIAGNOSIS — Z955 Presence of coronary angioplasty implant and graft: Secondary | ICD-10-CM

## 2017-06-26 DIAGNOSIS — Z713 Dietary counseling and surveillance: Secondary | ICD-10-CM | POA: Diagnosis not present

## 2017-06-27 ENCOUNTER — Encounter (HOSPITAL_COMMUNITY): Payer: Self-pay

## 2017-06-27 DIAGNOSIS — D510 Vitamin B12 deficiency anemia due to intrinsic factor deficiency: Secondary | ICD-10-CM | POA: Diagnosis not present

## 2017-06-27 DIAGNOSIS — H6983 Other specified disorders of Eustachian tube, bilateral: Secondary | ICD-10-CM | POA: Diagnosis not present

## 2017-06-27 NOTE — Progress Notes (Signed)
Cardiac Individual Treatment Plan  Patient Details  Name: CHAIS FEHRINGER MRN: [id number] Date of Birth: 1961-07-08 Referring Provider:     CARDIAC REHAB PHASE II ORIENTATION from 04/18/2017 in [place]  Referring Provider  Casandra Doffing MD      Initial Encounter Date:    CARDIAC REHAB PHASE II ORIENTATION from 04/18/2017 in [place]  Date  04/18/17  Referring Provider  Casandra Doffing MD      Visit Diagnosis: 04/06/17 Status post coronary artery stent placement  Patient's Home Medications on Admission:  Current Outpatient Prescriptions:  .  acetaminophen (TYLENOL) 500 MG tablet, Take 2 tablets (1,000 mg total) by mouth daily as needed for moderate pain or headache., Disp: , Rfl:  .  APPLE CIDER VINEGAR PO, Take 30 mLs by mouth at bedtime., Disp: , Rfl:  .  Ascorbic Acid (VITAMIN C) 500 MG CHEW, Chew 250 mg by mouth daily., Disp: , Rfl:  .  aspirin EC 81 MG tablet, Take 1 tablet (81 mg total) by mouth daily., Disp: 30 tablet, Rfl: 0 .  atorvastatin (LIPITOR) 80 MG tablet, TAKE 1 TABLET (80 MG TOTAL) BY MOUTH DAILY., Disp: 30 tablet, Rfl: 8 .  buPROPion (WELLBUTRIN SR) 150 MG 12 hr tablet, Take 150 mg by mouth 2 (two) times daily., Disp: , Rfl: 1 .  Cyanocobalamin (B-12 COMPLIANCE INJECTION) 1000 MCG/ML KIT, Inject as directed every 30 (thirty) days. , Disp: , Rfl:  .  esomeprazole (NEXIUM) 20 MG capsule, Take 20 mg by mouth daily at 12 noon., Disp: , Rfl:  .  Fe-Succ Ac-C-Thre Ac-B12-FA (FERREX 150 FORTE PLUS) 50-100 MG CAPS, Take 1 capsule by mouth every evening. , Disp: , Rfl:  .  fenofibrate 160 MG tablet, Take 160 mg by mouth every evening. , Disp: , Rfl:  .  gabapentin (NEURONTIN) 300 MG capsule, Take 1 capsule (300 mg) by mouth each morning and 2 capsule (600 mg) by mouth each evening., Disp: , Rfl:  .  glimepiride (AMARYL) 1 MG tablet, Take 0.5 mg by mouth daily with breakfast. , Disp: , Rfl:  .  hydrocortisone  cream 1 %, Apply 1 application topically daily as needed for itching., Disp: , Rfl:  .  JANUMET XR 50-1000 MG TB24, Take 1 tablet by mouth 2 (two) times daily. , Disp: , Rfl: 3 .  lisinopril (PRINIVIL,ZESTRIL) 10 MG tablet, Take 5 mg by mouth daily., Disp: , Rfl: 12 .  Multiple Vitamins-Minerals (MULTIVITAMIN PO), Take 1 tablet by mouth daily., Disp: , Rfl:  .  nebivolol (BYSTOLIC) 5 MG tablet, Take 5 mg by mouth every evening. , Disp: , Rfl:  .  nitroGLYCERIN (NITROSTAT) 0.4 MG SL tablet, Place 0.4 mg under the tongue every 5 (five) minutes as needed for chest pain. 3 DOSES MAX, Disp: , Rfl:  .  ONE TOUCH ULTRA TEST test strip, 1 each. , Disp: , Rfl:  .  prasugrel (EFFIENT) 10 MG TABS tablet, TAKE 1 TABLET BY MOUTH EVERY DAY, Disp: 30 tablet, Rfl: 5 .  venlafaxine XR (EFFEXOR-XR) 75 MG 24 hr capsule, Take 75 mg by mouth every evening. , Disp: , Rfl: 2  Past Medical History: Past Medical History:  Diagnosis Date  . Arthritis   . Coronary artery disease    a. DES to LCx (2008)  b. DES to RCA (2012) c. Overlapping DESx2 to large ramus (03/2014). d. [place] 03/2017 s/p cutting ballon angioplasty to ramus 2.  . GERD (gastroesophageal  reflux disease)   . High cholesterol   . Hypertension   . Obesity   . Type II diabetes mellitus (HCC)     Tobacco Use: History  Smoking Status  . Former Smoker  . Packs/day: 3.00  . Years: 23.00  . Types: Cigarettes  . Quit date: 01/18/1999  Smokeless Tobacco  . Never Used    Labs: Recent Review Flowsheet Data    Labs for ITP Cardiac and Pulmonary Rehab Latest Ref Rng & Units 07/08/2008 12/01/2016   Cholestrol 100 - 199 mg/dL - 123   LDLCALC 0 - 99 mg/dL - 61   HDL >39 mg/dL - 24(L)   Trlycerides 0 - 149 mg/dL - 192(H)   TCO2 - 28 -      Capillary Blood Glucose: Lab Results  Component Value Date   GLUCAP 254 (H) 05/19/2017   GLUCAP 149 (H) 05/01/2017   GLUCAP 139 (H) 04/26/2017   GLUCAP 180 (H) 04/26/2017   GLUCAP 104 (H) 04/06/2017      Exercise Target Goals:    Exercise Program Goal: Individual exercise prescription set with THRR, safety & activity barriers. Participant demonstrates ability to understand and report RPE using BORG scale, to self-measure pulse accurately, and to acknowledge the importance of the exercise prescription.  Exercise Prescription Goal: Starting with aerobic activity 30 plus minutes a day, 3 days per week for initial exercise prescription. Provide home exercise prescription and guidelines that participant acknowledges understanding prior to discharge.  Activity Barriers & Risk Stratification:     Activity Barriers & Cardiac Risk Stratification - 04/18/17 0857      Activity Barriers & Cardiac Risk Stratification   Activity Barriers Back Problems;Other (comment)   Comments R knee discomfort   Cardiac Risk Stratification High      6 Minute Walk:     6 Minute Walk    Row Name 04/18/17 1144         6 Minute Walk   Phase Initial     Distance 1863 feet     Walk Time 6 minutes     # of Rest Breaks 0     MPH 3.53     METS 4.5     RPE 11     VO2 Peak 15.6     Symptoms Yes (comment)     Comments heavier breathing at end of walk test     Resting HR 67 bpm     Resting BP 128/60     Max Ex. HR 103 bpm     Max Ex. BP 122/72     2 Minute Post BP 116/66        Oxygen Initial Assessment:   Oxygen Re-Evaluation:   Oxygen Discharge (Final Oxygen Re-Evaluation):   Initial Exercise Prescription:     Initial Exercise Prescription - 04/18/17 1100      Date of Initial Exercise RX and Referring Provider   Date 04/18/17   Referring Provider Casandra Doffing MD     Treadmill   MPH 2.8   Grade 1   Minutes 10   METs 3.53     Bike   Level 1.2   Minutes 10   METs 3.38     NuStep   Level 3   SPM 80   Minutes 10   METs 2.5     Prescription Details   Frequency (times per week) 3   Duration Progress to 30 minutes of continuous aerobic without signs/symptoms of physical  distress     Intensity  THRR 40-80% of Max Heartrate 66-132   Ratings of Perceived Exertion 11-15   Perceived Dyspnea 0-4     Progression   Progression Continue to progress workloads to maintain intensity without signs/symptoms of physical distress.     Resistance Training   Training Prescription Yes   Weight 3lbs   Reps 10-15      Perform Capillary Blood Glucose checks as needed.  Exercise Prescription Changes:     Exercise Prescription Changes    Row Name 04/26/17 1131 05/18/17 0800 05/29/17 1600 06/09/17 1215 06/27/17 1200     Response to Exercise   Blood Pressure (Admit) 128/72 134/64 124/74 108/70 128/80   Blood Pressure (Exercise) 144/78 164/70 150/70 154/70 162/70   Blood Pressure (Exit) 118/70 104/60 108/70 120/80 120/80   Heart Rate (Admit) 81 bpm 91 bpm 84 bpm 78 bpm 63 bpm   Heart Rate (Exercise) 110 bpm 126 bpm 121 bpm 124 bpm 136 bpm   Heart Rate (Exit) 81 bpm 88 bpm 91 bpm 88 bpm 80 bpm   Rating of Perceived Exertion (Exercise) '13 12 13 11 14   ' Symptoms none  none  low back pain. did wear back brace to manage pain with activity none none   Comments Pt was oriented to exercise equiment. Pt did well with exercise  -  -  - pt does HIIT   Duration Continue with 30 min of aerobic exercise without signs/symptoms of physical distress. Continue with 30 min of aerobic exercise without signs/symptoms of physical distress. Continue with 30 min of aerobic exercise without signs/symptoms of physical distress. Continue with 30 min of aerobic exercise without signs/symptoms of physical distress. Continue with 30 min of aerobic exercise without signs/symptoms of physical distress.   Intensity THRR unchanged THRR unchanged THRR unchanged THRR unchanged THRR unchanged     Progression   Progression Continue to progress workloads to maintain intensity without signs/symptoms of physical distress. Continue to progress workloads to maintain intensity without signs/symptoms of physical  distress. Continue to progress workloads to maintain intensity without signs/symptoms of physical distress. Continue to progress workloads to maintain intensity without signs/symptoms of physical distress. Continue to progress workloads to maintain intensity without signs/symptoms of physical distress.   Average METs 3.8 5.1 5.4 5.9 6.4     Resistance Training   Training Prescription Yes Yes Yes Yes Yes   Weight 5lbs 8lbs 8lbs 8lbs 8lbs   Reps 10-15 10-15 10-15 10-15 10-15   Time 10 Minutes 10 Minutes 10 Minutes 10 Minutes 10 Minutes     Treadmill   MPH 2.8 3 3.2 3.2 3.3  HIIT @ 3.3/10 for 2' and mod. intensity @ 3.3/3 for 8'   Grade '1 2 2 3 10   ' Minutes '19 10 10 10 10   ' METs 3.53 4.12 4.33 4.89 5.5     Bike   Level 1.2 2 2.5 2.5 2.5  HIIT '@4' .3 for 2' and mod. intensity '@2' .5 for 8'   Minutes '10 10 10 10 10   ' METs 3.38 5.13 6.17 6.25 6.99     NuStep   Level '3 5 5 5 6  ' HIIT @ level 8 for 2' and mod. intensity @ level 6 for 8'   SPM 90 100 95 95 95   Minutes '10 10 10 10 10   ' METs 3.9 6.1 5.7 6.5 6.7     Home Exercise Plan   Plans to continue exercise at  -  - Home (comment)  walking and using total gym Home (  comment)  walking and using total gym Home (comment)  walking and using total gym   Frequency  -  - Add 2 additional days to program exercise sessions. Add 2 additional days to program exercise sessions. Add 2 additional days to program exercise sessions.   Initial Home Exercises Provided  -  - 05/24/17 05/24/17 05/24/17      Exercise Comments:     Exercise Comments    Row Name 05/02/17 1134 05/30/17 0819 06/27/17 1219       Exercise Comments Pt was oriented to exercise equipment on 04/26/17. Pt responded well to exercise sessioin and reported no signs/symptoms of CP, dizziness or unusual SOB. Reviewed METs and goals. Pt is tolerating exercise very well; will continue to monitor pt's progress and activity levels. Reviewed METs and goals. Pt is tolerating exercise very  well; will continue to monitor pt's progress and activity levels.        Exercise Goals and Review:     Exercise Goals    Row Name 04/18/17 0859             Exercise Goals   Increase Physical Activity Yes       Intervention Provide advice, education, support and counseling about physical activity/exercise needs.;Develop an individualized exercise prescription for aerobic and resistive training based on initial evaluation findings, risk stratification, comorbidities and participant's personal goals.       Expected Outcomes Achievement of increased cardiorespiratory fitness and enhanced flexibility, muscular endurance and strength shown through measurements of functional capacity and personal statement of participant.       Increase Strength and Stamina Yes       Intervention Provide advice, education, support and counseling about physical activity/exercise needs.;Develop an individualized exercise prescription for aerobic and resistive training based on initial evaluation findings, risk stratification, comorbidities and participant's personal goals.       Expected Outcomes Achievement of increased cardiorespiratory fitness and enhanced flexibility, muscular endurance and strength shown through measurements of functional capacity and personal statement of participant.          Exercise Goals Re-Evaluation :     Exercise Goals Re-Evaluation    Row Name 05/24/17 1605 06/27/17 1219           Exercise Goal Re-Evaluation   Exercise Goals Review Increase Physical Activity;Able to understand and use rate of perceived exertion (RPE) scale;Knowledge and understanding of Target Heart Rate Range (THRR);Understanding of Exercise Prescription;Increase Strength and Stamina Increase Physical Activity;Able to understand and use rate of perceived exertion (RPE) scale;Knowledge and understanding of Target Heart Rate Range (THRR);Understanding of Exercise Prescription;Increase Strength and Stamina       Comments Reviewed home exercise with pt today.  Pt plans to walk and use total gym for exercise, 3x/week in addition to coming to cardiac rehan.  Reviewed THR, pulse, RPE, sign and symptoms, NTG use, and when to call 911 or MD.  Also discussed weather considerations and indoor options.  Pt voiced understanding. Pt is doing HIIT in cardiac rehab. Pt is showing great progress in MET and activity levels. Pt is also compliant with HEP, in which he walks for 30 minutes and uses total gym 2-3x/week. Discussed exercise limitations to ensure safety . Pt voiced understanding and is well aware of limitations.       Expected Outcomes Pt will be compliant with HEP and improve in cardiorespiratory fitness and functional capacity. Pt will be compliant with HEP and improve in cardiorespiratory fitness and functional capacity.  Discharge Exercise Prescription (Final Exercise Prescription Changes):     Exercise Prescription Changes - 06/27/17 1200      Response to Exercise   Blood Pressure (Admit) 128/80   Blood Pressure (Exercise) 162/70   Blood Pressure (Exit) 120/80   Heart Rate (Admit) 63 bpm   Heart Rate (Exercise) 136 bpm   Heart Rate (Exit) 80 bpm   Rating of Perceived Exertion (Exercise) 14   Symptoms none   Comments pt does HIIT   Duration Continue with 30 min of aerobic exercise without signs/symptoms of physical distress.   Intensity THRR unchanged     Progression   Progression Continue to progress workloads to maintain intensity without signs/symptoms of physical distress.   Average METs 6.4     Resistance Training   Training Prescription Yes   Weight 8lbs   Reps 10-15   Time 10 Minutes     Treadmill   MPH 3.3  HIIT @ 3.3/10 for 2' and mod. intensity @ 3.3/3 for 8'   Grade 10   Minutes 10   METs 5.5     Bike   Level 2.5  HIIT '@4' .3 for 2' and mod. intensity '@2' .5 for 8'   Minutes 10   METs 6.99     NuStep   Level 6  HIIT @ level 8 for 2' and mod. intensity @ level 6  for 8'   SPM 95   Minutes 10   METs 6.7     Home Exercise Plan   Plans to continue exercise at Home (comment)  walking and using total gym   Frequency Add 2 additional days to program exercise sessions.   Initial Home Exercises Provided 05/24/17      Nutrition:  Target Goals: Understanding of nutrition guidelines, daily intake of sodium <1561m, cholesterol <2072m calories 30% from fat and 7% or less from saturated fats, daily to have 5 or more servings of fruits and vegetables.  Biometrics:     Pre Biometrics - 04/18/17 1149      Pre Biometrics   Waist Circumference 43 inches   Hip Circumference 42.5 inches   Waist to Hip Ratio 1.01 %   Triceps Skinfold 25 mm   % Body Fat 32 %   Grip Strength 51 kg   Flexibility 12 in   Single Leg Stand 14.21 seconds       Nutrition Therapy Plan and Nutrition Goals:     Nutrition Therapy & Goals - 04/18/17 1207      Nutrition Therapy   Diet Carb Modified, Therapeutic Lifestyle Changes     Personal Nutrition Goals   Nutrition Goal Pt to identify food quantities necessary to achieve weight loss of 6-24 lb (2.7-10.9 kg) at graduation from cardiac rehab. Goal wt of 170 lb desired.    Personal Goal #2 CBG concentrations in the normal range or as close to normal as is safely possible.     Intervention Plan   Intervention Prescribe, educate and counsel regarding individualized specific dietary modifications aiming towards targeted core components such as weight, hypertension, lipid management, diabetes, heart failure and other comorbidities.   Expected Outcomes Short Term Goal: Understand basic principles of dietary content, such as calories, fat, sodium, cholesterol and nutrients.;Long Term Goal: Adherence to prescribed nutrition plan.      Nutrition Discharge: Nutrition Scores:     Nutrition Assessments - 04/18/17 1128      MEDFICTS Scores   Pre Score 32      Nutrition Goals Re-Evaluation:  Nutrition Goals  Re-Evaluation:   Nutrition Goals Discharge (Final Nutrition Goals Re-Evaluation):   Psychosocial: Target Goals: Acknowledge presence or absence of significant depression and/or stress, maximize coping skills, provide positive support system. Participant is able to verbalize types and ability to use techniques and skills needed for reducing stress and depression.  Initial Review & Psychosocial Screening:     Initial Psych Review & Screening - 04/28/17 1208      Initial Review   Current issues with None Identified     Family Dynamics   Good Support System? Yes   Comments no psychosocial needs identified, no interventions necessary      Barriers   Psychosocial barriers to participate in program There are no identifiable barriers or psychosocial needs.     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 pt has learned how to cope. pt also sees therapist for depression which is currenlty stable.    GLOBAL Pre --  overall pt concerns are depression related, pt states symptoms are presently well managed and verablizes when to contact therapist/psychiatry if symptoms change.  pt offered emotional support and reassurance.      PHQ-9: Recent Review Flowsheet Data    Depression screen [facility] 2/9 04/28/2017 09/22/2016   Decreased Interest 0 0   Down, Depressed, Hopeless 0  0   PHQ - 2 Score 0 0     Interpretation of Total Score  Total Score Depression Severity:  1-4 = Minimal depression, 5-9 = Mild depression, 10-14 = Moderate depression, 15-19 = Moderately severe depression, 20-27 = Severe depression   Psychosocial Evaluation and Intervention:     Psychosocial Evaluation - 04/28/17 1212      Psychosocial Evaluation & Interventions    Interventions Stress management education;Relaxation education;Encouraged to exercise with the program and follow exercise prescription   Comments pt with history of depression currently well managed with effoxor, wellbutrin and therapy   Expected Outcomes pt will exhibit maintenance of well managed depression, positive outlook and good coping skills    Continue Psychosocial Services  Follow up required by staff      Psychosocial Re-Evaluation:     Psychosocial Re-Evaluation    [place] Name 05/02/17 [place] 05/31/17 1111 06/21/17 1023         Psychosocial Re-Evaluation   Current issues with History of Depression;Current Depression History of Depression;Current Depression History of Depression;Current Depression     Comments pt with known history of depression, symptoms well managed on current treatment.   pt with known history of depression, symptoms well managed on current treatment.   pt with known history of depression, symptoms well managed on current treatment.       Expected Outcomes pt will demonstrate good coping skills with positive outlook. pt will demonstrate good coping skills with positive outlook. pt will demonstrate good coping skills with positive outlook.     Interventions Relaxation education;Stress management education;Encouraged to attend Cardiac Rehabilitation for the exercise Relaxation education;Stress management education;Encouraged to attend Cardiac Rehabilitation for the exercise Relaxation education;Stress management education;Encouraged to attend Cardiac Rehabilitation for the exercise     Continue Psychosocial Services  Follow up required by staff Follow up required by staff Follow up required by staff  Psychosocial Discharge (Final Psychosocial Re-Evaluation):     Psychosocial Re-Evaluation - 06/21/17 1023      Psychosocial Re-Evaluation   Current issues with History of Depression;Current Depression   Comments pt with known history of depression, symptoms  well managed on current treatment.     Expected Outcomes pt will demonstrate good coping skills with positive outlook.   Interventions Relaxation education;Stress management education;Encouraged to attend Cardiac Rehabilitation for the exercise   Continue Psychosocial Services  Follow up required by staff      Vocational Rehabilitation: Provide vocational rehab assistance to qualifying candidates.   Vocational Rehab Evaluation & Intervention:     Vocational Rehab - 04/28/17 1207      Initial Vocational Rehab Evaluation & Intervention   Assessment shows need for Vocational Rehabilitation No      Education: Education Goals: Education classes will be provided on a weekly basis, covering required topics. Participant will state understanding/return demonstration of topics presented.  Learning Barriers/Preferences:     Learning Barriers/Preferences - 04/18/17 0859      Learning Barriers/Preferences   Learning Preferences Skilled Demonstration;Written Material;Computer/Internet      Education Topics: Count Your Pulse:  -Group instruction provided by verbal instruction, demonstration, patient participation and written materials to support subject.  Instructors address importance of being able to find your pulse and how to count your pulse when at home without a heart monitor.  Patients get hands on experience counting their pulse with staff help and individually.   CARDIAC REHAB PHASE II EXERCISE from 06/23/2017 in [place]  Date  05/26/17  Instruction Review Code  2- meets goals/outcomes      Heart Attack, Angina, and Risk Factor Modification:  -Group instruction provided by verbal instruction, video, and written materials to support subject.  Instructors address signs and symptoms of angina and heart attacks.    Also discuss risk factors for heart disease and how to make changes to improve heart health risk factors.   CARDIAC REHAB PHASE II  EXERCISE from 06/23/2017 in [place]  Date  06/07/17  Instruction Review Code  2- meets goals/outcomes      Functional Fitness:  -Group instruction provided by verbal instruction, demonstration, patient participation, and written materials to support subject.  Instructors address safety measures for doing things around the house.  Discuss how to get up and down off the floor, how to pick things up properly, how to safely get out of a chair without assistance, and balance training.   CARDIAC REHAB PHASE II EXERCISE from 06/23/2017 in [place]  Date  05/05/17  Instruction Review Code  2- meets goals/outcomes      Meditation and Mindfulness:  -Group instruction provided by verbal instruction, patient participation, and written materials to support subject.  Instructor addresses importance of mindfulness and meditation practice to help reduce stress and improve awareness.  Instructor also leads participants through a meditation exercise.    CARDIAC REHAB PHASE II EXERCISE from 06/23/2017 in [place]  Date  05/03/17  Instruction Review Code  2- meets goals/outcomes      Stretching for Flexibility and Mobility:  -Group instruction provided by verbal instruction, patient participation, and written materials to support subject.  Instructors lead participants through series of stretches that are designed to increase flexibility thus improving mobility.  These stretches are additional exercise for major muscle groups that are typically performed during regular warm up and cool  down.   CARDIAC REHAB PHASE II EXERCISE from 06/23/2017 in [place]  Date  06/23/17  Instruction Review Code  2- meets goals/outcomes      Hands Only CPR:  -Group verbal, video, and participation provides a basic overview of AHA guidelines for community CPR. Role-play of emergencies allow  participants the opportunity to practice calling for help and chest compression technique with discussion of AED use.   Hypertension: -Group verbal and written instruction that provides a basic overview of hypertension including the most recent diagnostic guidelines, risk factor reduction with self-care instructions and medication management.   CARDIAC REHAB PHASE II EXERCISE from 06/23/2017 in [place]  Date  06/16/17  Instruction Review Code  2- meets goals/outcomes       Nutrition I class: Heart Healthy Eating:  -Group instruction provided by PowerPoint slides, verbal discussion, and written materials to support subject matter. The instructor gives an explanation and review of the Therapeutic Lifestyle Changes diet recommendations, which includes a discussion on lipid goals, dietary fat, sodium, fiber, plant stanol/sterol esters, sugar, and the components of a well-balanced, healthy diet.   Nutrition II class: Lifestyle Skills:  -Group instruction provided by PowerPoint slides, verbal discussion, and written materials to support subject matter. The instructor gives an explanation and review of label reading, grocery shopping for heart health, heart healthy recipe modifications, and ways to make healthier choices when eating out.   Diabetes Question & Answer:  -Group instruction provided by PowerPoint slides, verbal discussion, and written materials to support subject matter. The instructor gives an explanation and review of diabetes co-morbidities, pre- and post-prandial blood glucose goals, pre-exercise blood glucose goals, signs, symptoms, and treatment of hypoglycemia and hyperglycemia, and foot care basics.   Diabetes Blitz:  -Group instruction provided by PowerPoint slides, verbal discussion, and written materials to support subject matter. The instructor gives an explanation and review of the physiology behind type 1 and type 2 diabetes, diabetes  medications and rational behind using different medications, pre- and post-prandial blood glucose recommendations and Hemoglobin A1c goals, diabetes diet, and exercise including blood glucose guidelines for exercising safely.    Portion Distortion:  -Group instruction provided by PowerPoint slides, verbal discussion, written materials, and food models to support subject matter. The instructor gives an explanation of serving size versus portion size, changes in portions sizes over the last 20 years, and what consists of a serving from each food group.   Stress Management:  -Group instruction provided by verbal instruction, video, and written materials to support subject matter.  Instructors review role of stress in heart disease and how to cope with stress positively.     CARDIAC REHAB PHASE II EXERCISE from 06/23/2017 in [place]  Date  05/17/17  Instruction Review Code  2- meets goals/outcomes      Exercising on Your Own:  -Group instruction provided by verbal instruction, power point, and written materials to support subject.  Instructors discuss benefits of exercise, components of exercise, frequency and intensity of exercise, and end points for exercise.  Also discuss use of nitroglycerin and activating EMS.  Review options of places to exercise outside of rehab.  Review guidelines for sex with heart disease.   CARDIAC REHAB PHASE II EXERCISE from 06/23/2017 in [place]  Date  05/24/17  Instruction Review Code  2- meets goals/outcomes      Cardiac Drugs I:  -Group instruction provided by verbal  instruction and written materials to support subject.  Instructor reviews cardiac drug classes: antiplatelets, anticoagulants, beta blockers, and statins.  Instructor discusses reasons, side effects, and lifestyle considerations for each drug class.   CARDIAC REHAB PHASE II EXERCISE from 06/23/2017 in [place]  Date  05/31/17  Educator  Kennyth Lose  Instruction Review Code  2- meets goals/outcomes      Cardiac Drugs II:  -Group instruction provided by verbal instruction and written materials to support subject.  Instructor reviews cardiac drug classes: angiotensin converting enzyme inhibitors (ACE-I), angiotensin II receptor blockers (ARBs), nitrates, and calcium channel blockers.  Instructor discusses reasons, side effects, and lifestyle considerations for each drug class.   Anatomy and Physiology of the Circulatory System:  Group verbal and written instruction and models provide basic cardiac anatomy and physiology, with the coronary electrical and arterial systems. Review of: AMI, Angina, Valve disease, Heart Failure, Peripheral Artery Disease, Cardiac Arrhythmia, Pacemakers, and the ICD.   CARDIAC REHAB PHASE II EXERCISE from 06/23/2017 in [place]  Date  06/14/17  Educator  RN  Instruction Review Code  2- meets goals/outcomes      Other Education:  -Group or individual verbal, written, or video instructions that support the educational goals of the cardiac rehab program.   Knowledge Questionnaire Score:     Knowledge Questionnaire Score - 04/28/17 1206      Knowledge Questionnaire Score   Pre Score 20/24 cardiac, DM 13/15      Core Components/Risk Factors/Patient Goals at Admission:     Personal Goals and Risk Factors at Admission - 04/18/17 1150      Core Components/Risk Factors/Patient Goals on Admission   Diabetes Yes   Intervention Provide education about signs/symptoms and action to take for hypo/hyperglycemia.;Provide education about proper nutrition, including hydration, and aerobic/resistive exercise prescription along with prescribed medications to achieve blood glucose in normal ranges: Fasting glucose 65-99 mg/dL   Expected Outcomes Short Term: Participant verbalizes understanding of the signs/symptoms and immediate care of  hyper/hypoglycemia, proper foot care and importance of medication, aerobic/resistive exercise and nutrition plan for blood glucose control.;Long Term: Attainment of HbA1C < 7%.   Hypertension Yes   Intervention Provide education on lifestyle modifcations including regular physical activity/exercise, weight management, moderate sodium restriction and increased consumption of fresh fruit, vegetables, and low fat dairy, alcohol moderation, and smoking cessation.;Monitor prescription use compliance.   Expected Outcomes Short Term: Continued assessment and intervention until BP is < 140/52m HG in hypertensive participants. < 130/841mHG in hypertensive participants with diabetes, heart failure or chronic kidney disease.;Long Term: Maintenance of blood pressure at goal levels.   Lipids Yes   Intervention Provide education and support for participant on nutrition & aerobic/resistive exercise along with prescribed medications to achieve LDL <7065mHDL >25m2m Expected Outcomes Short Term: Participant states understanding of desired cholesterol values and is compliant with medications prescribed. Participant is following exercise prescription and nutrition guidelines.;Long Term: Cholesterol controlled with medications as prescribed, with individualized exercise RX and with personalized nutrition plan. Value goals: LDL < 70mg96mL > 40 mg.      Core Components/Risk Factors/Patient Goals Review:      Goals and Risk Factor Review    Row Name 05/02/17 0728 05/31/17 1109 06/21/17 1022         Core Components/Risk Factors/Patient Goals Review   Personal Goals Review Weight Management/Obesity;Hypertension;Lipids;Diabetes Weight Management/Obesity;Hypertension;Lipids;Diabetes Weight Management/Obesity;Hypertension;Lipids;Diabetes     Review pt with multiple risk factors  demonstrates eagerness to participate in CR activities. pt with multiple risk factors demonstrates eagerness to participate in CR activities.  pt activity progression somewhat limited by back pain.  pt encouraged to continue stretches and core strengthening exercises as prescribed by physical therapy.   pt with multiple risk factors demonstrates eagerness to participate in CR activities. pt enjoying HIIT participation.       Expected Outcomes pt will participate in CR exercise, nutrition and lifestyle modification activities to decrease overall CAD risk factors  pt will participate in CR exercise, nutrition and lifestyle modification activities to decrease overall CAD risk factors  pt will participate in CR exercise, nutrition and lifestyle modification activities to decrease overall CAD risk factors         Core Components/Risk Factors/Patient Goals at Discharge (Final Review):      Goals and Risk Factor Review - 06/21/17 1022      Core Components/Risk Factors/Patient Goals Review   Personal Goals Review Weight Management/Obesity;Hypertension;Lipids;Diabetes   Review pt with multiple risk factors demonstrates eagerness to participate in CR activities. pt enjoying HIIT participation.     Expected Outcomes pt will participate in CR exercise, nutrition and lifestyle modification activities to decrease overall CAD risk factors       ITP Comments:     ITP Comments    Row Name 04/18/17 0854 05/02/17 0726 05/31/17 1109 06/27/17 1536     ITP Comments Medical Director; Dr. Fransico Him Medical Director; Dr. Fransico Him Medical Director; Dr. Fransico Him 30 day ITP review.  Pt with good participation and attendance.  No change in current regimen unless directed by Medical Director.         Comments:

## 2017-06-28 ENCOUNTER — Encounter (HOSPITAL_COMMUNITY): Payer: 59

## 2017-06-28 ENCOUNTER — Encounter (HOSPITAL_COMMUNITY)
Admission: RE | Admit: 2017-06-28 | Discharge: 2017-06-28 | Disposition: A | Discharge status: Home / Self Care | Payer: 59 | Source: Ambulatory Visit | Attending: Interventional Cardiology | Admitting: Interventional Cardiology

## 2017-06-28 DIAGNOSIS — Z713 Dietary counseling and surveillance: Secondary | ICD-10-CM | POA: Diagnosis not present

## 2017-06-28 DIAGNOSIS — Z955 Presence of coronary angioplasty implant and graft: Secondary | ICD-10-CM

## 2017-06-29 ENCOUNTER — Encounter: Payer: Self-pay | Admitting: Interventional Cardiology

## 2017-06-29 ENCOUNTER — Ambulatory Visit (INDEPENDENT_AMBULATORY_CARE_PROVIDER_SITE_OTHER): Payer: 59 | Admitting: Interventional Cardiology

## 2017-06-29 VITALS — BP 102/58 | HR 70 | Ht 68.0 in | Wt 202.2 lb

## 2017-06-29 DIAGNOSIS — I10 Essential (primary) hypertension: Secondary | ICD-10-CM

## 2017-06-29 DIAGNOSIS — E782 Mixed hyperlipidemia: Secondary | ICD-10-CM

## 2017-06-29 DIAGNOSIS — I25119 Atherosclerotic heart disease of native coronary artery with unspecified angina pectoris: Secondary | ICD-10-CM | POA: Diagnosis not present

## 2017-06-29 NOTE — Patient Instructions (Signed)

## 2017-06-29 NOTE — Progress Notes (Signed)
Cardiology Office Note   Date:  06/29/2017   ID:  Troy Martinez, DOB 08/19/1961, MRN [id number]  PCP:  Street, Sharon Mt, MD    No chief complaint on file.    Wt Readings from Last 3 Encounters:  06/29/17 202 lb 3.2 oz (91.7 kg)  04/18/17 205 lb 7.5 oz (93.2 kg)  04/13/17 204 lb (92.5 kg)       History of Present Illness: Troy Martinez is a 56 y.o. male  with history of CAD (s/p prior DES to LCX 2008, DES to RCA 2012, DESx2 to ramus 03/2014, recent PTCA to ramus 2 03/2017), GERD, HTN, HLD, obesity, arthritis, DM2.  Cath in 7/18 showed: 1. Patent RCA stent. 2. Patent circuflex stent. 3. Mid LAD lesion, 25 %stenosed. 4. Ramus-2 lesion, 70 %stenosed, focal area of instent restenosis. The was treated with a 3.0 Wolverine cutting balloon, inflated to 3.25 mm. 5. Post intervention, there is a 0% residual stenosis. 6. The left ventricular systolic function is normal. 7. LV end diastolic pressure is normal. 8. The left ventricular ejection fraction is 55-65% by visual estimate. 9. There is no aortic valve stenosis. 10. Dist Cx lesion, 75 %stenosed. Unchanged from prior in small vessel.   Denies : Chest pain. Leg edema. Nitroglycerin use. Orthopnea. Palpitations. Paroxysmal nocturnal dyspnea. Syncope.   He has had chronic dizziness for years.  Trying Flonase for allergies.  Cardiac rehab has now moved to the HIIT.  He feels better with that exercise.  He has some difficulty losing weight.     He had some SHOB when he saw Dayna.  It has resolved.      Past Medical History:  Diagnosis Date  . Arthritis   . Coronary artery disease    a. DES to LCx (2008)  b. DES to RCA (2012) c. Overlapping DESx2 to large ramus (03/2014). d. [place] 03/2017 s/p cutting ballon angioplasty to ramus 2.  . GERD (gastroesophageal reflux disease)   . High cholesterol   . Hypertension   . Obesity   . Type II diabetes mellitus ([place])     Past Surgical History:  Procedure Laterality Date    . ANTERIOR CERVICAL DECOMP/DISCECTOMY FUSION  05/2005  . BACK SURGERY    . CARDIAC CATHETERIZATION  06/2007   . CORONARY ANGIOPLASTY WITH STENT PLACEMENT  06/2007 11/2010   DES in distal circumflex ([facility]) w normal LV function 11/2010 DES to RCA-Promus  . CORONARY ANGIOPLASTY WITH STENT PLACEMENT  04/04/2014   "2; got a total of 4 now"  . CORONARY BALLOON ANGIOPLASTY Left 04/06/2017   Procedure: Coronary Balloon Angioplasty;  Surgeon: Jettie Booze, MD;  Location: [facility];  Service: Cardiovascular;  Laterality: Left;  Cutting balloon to the Mid  Ramus  . DECOMPRESSION FASCIOTOMY LEG Right 2003  . LEFT HEART CATH AND CORONARY ANGIOGRAPHY N/A 04/06/2017   Procedure: Left Heart Cath and Coronary Angiography;  Surgeon: Jettie Booze, MD;  Location: [facility];  Service: Cardiovascular;  Laterality: N/A;  . LEFT HEART CATHETERIZATION WITH CORONARY ANGIOGRAM N/A 04/04/2014   Procedure: LEFT HEART CATHETERIZATION WITH CORONARY ANGIOGRAM;  Surgeon: Jettie Booze, MD;  Location: [facility] C[facility];  Service: Cardiovascular;  Laterality: N/A;  . LEG SURGERY Right    "cut scar tissue out"  . ORIF PROXIMAL TIBIAL PLATEAU FRACTURE Right 2003  . PERCUTANEOUS STENT INTERVENTION  04/04/2014   Procedure: PERCUTANEOUS STENT INTERVENTION;  Surgeon: Jettie Booze, MD;  Location: [facility] CATH LAB;  Service: Cardiovascular;;  DES x 2 Ramus 2.5x8, 2.5x32   . TIBIA HARDWARE REMOVAL Right ?2004     Current Outpatient Prescriptions  Medication Sig Dispense Refill  . acetaminophen (TYLENOL) 500 MG tablet Take 2 tablets (1,000 mg total) by mouth daily as needed for moderate pain or headache.    . Ascorbic Acid (VITAMIN C) 500 MG CHEW Chew 250 mg by mouth daily.    Marland Kitchen atorvastatin (LIPITOR) 80 MG tablet TAKE 1 TABLET (80 MG TOTAL) BY MOUTH DAILY. 30 tablet 8  . buPROPion (WELLBUTRIN SR) 150 MG 12 hr tablet Take 150 mg by mouth 2 (two) times daily.  1  . Cyanocobalamin (B-12 COMPLIANCE  INJECTION) 1000 MCG/ML KIT Inject as directed every 30 (thirty) days.     Marland Kitchen esomeprazole (NEXIUM) 20 MG capsule Take 20 mg by mouth daily at 12 noon.    Marland Kitchen Fe-Succ Ac-C-Thre Ac-B12-FA (FERREX 150 FORTE PLUS) 50-100 MG CAPS Take 1 capsule by mouth every evening.     . fenofibrate 160 MG tablet Take 160 mg by mouth every evening.     . fluticasone (FLONASE) 50 MCG/ACT nasal spray Place 2 sprays into both nostrils 2 (two) times daily.    Marland Kitchen gabapentin (NEURONTIN) 300 MG capsule Take 1 capsule (300 mg) by mouth each morning and 2 capsule (600 mg) by mouth each evening.    Marland Kitchen glimepiride (AMARYL) 1 MG tablet Take 0.5 mg by mouth daily with breakfast.     . JANUMET XR 50-1000 MG TB24 Take 1 tablet by mouth 2 (two) times daily.   3  . lisinopril (PRINIVIL,ZESTRIL) 10 MG tablet Take 5 mg by mouth daily.  12  . Multiple Vitamins-Minerals (MULTIVITAMIN PO) Take 1 tablet by mouth daily.    . nebivolol (BYSTOLIC) 5 MG tablet Take 5 mg by mouth every evening.     . nitroGLYCERIN (NITROSTAT) 0.4 MG SL tablet Place 0.4 mg under the tongue every 5 (five) minutes as needed for chest pain. 3 DOSES MAX    . ONE TOUCH ULTRA TEST test strip 1 each.     . prasugrel (EFFIENT) 10 MG TABS tablet TAKE 1 TABLET BY MOUTH EVERY DAY 30 tablet 5  . venlafaxine XR (EFFEXOR-XR) 75 MG 24 hr capsule Take 75 mg by mouth every evening.   2   No current facility-administered medications for this visit.     Allergies:   Patient has no known allergies.    Social History:  The patient  reports that he quit smoking about 18 years ago. His smoking use included Cigarettes. He has a 69.00 pack-year smoking history. He has never used smokeless tobacco. He reports that he drinks about 8.4 oz of alcohol per week . He reports that he uses drugs, including Marijuana.   Family History:  The patient's family history includes Heart attack in his father, maternal grandmother, and paternal grandfather; Heart disease in his father; Hypertension in  his father; Stroke in his mother.    ROS:  Please see the history of present illness.   Otherwise, review of systems are positive for difficulty losing weight.   All other systems are reviewed and negative.    PHYSICAL EXAM: VS:  BP (!) 102/58   Pulse 70   Ht _0  (1.727 m)   Wt 202 lb 3.2 oz (91.7 kg)   SpO2 97%   BMI 30.74 kg/m  , BMI Body mass index is 30.74 kg/m. GEN: Well nourished, well developed, in no acute distress  HEENT:  normal  Neck: no JVD, carotid bruits, or masses Cardiac: RRR; no murmurs, rubs, or gallops,no edema  Respiratory:  clear to auscultation bilaterally, normal work of breathing GI: soft, nontender, nondistended, + BS MS: no deformity or atrophy  Skin: warm and dry, no rash Neuro:  Strength and sensation are intact Psych: euthymic mood, full affect     Recent Labs: 12/01/2016: ALT 25 04/05/2017: BUN 21; Creatinine, Ser 0.98; Potassium 4.2; Sodium 139 04/13/2017: Hemoglobin 12.9; Platelets 273   Lipid Panel    Component Value Date/Time   CHOL 123 12/01/2016 0902   TRIG 192 (H) 12/01/2016 0902   HDL 24 (L) 12/01/2016 0902   CHOLHDL 5.1 (H) 12/01/2016 0902   LDLCALC 61 12/01/2016 0902     Other studies Reviewed: Additional studies/ records that were reviewed today with results demonstrating: cath results from 7/18 reviewed.   ASSESSMENT AND PLAN:  1. CAD: No angina on current medical therapy. Continue aggressive secondary prevention including healthy diet. He has gone to a high-intensity workout and is tolerating this well without angina. 2. Hyperlipidemia: Continue lipid-lowering therapy. LDL was well controlled in March 2018. 3. HTN: Blood pressure well controlled. Continue current medications.   Current medicines are reviewed at length with the patient today.  The patient concerns regarding his medicines were addressed.  The following changes have been made:  No change  Labs/ tests ordered today include:  No orders of the defined  types were placed in this encounter.   Recommend 150 minutes/week of aerobic exercise Low fat, low carb, high fiber diet recommended  Disposition:   FU in 6 months   Signed, Larae Grooms, MD  06/29/2017 3:41 PM    [facility] [place], [place], [place]  [address] Phone: [phone]; Fax: [phone]

## 2017-06-30 ENCOUNTER — Encounter (HOSPITAL_COMMUNITY): Payer: 59

## 2017-06-30 ENCOUNTER — Encounter (HOSPITAL_COMMUNITY)
Admission: RE | Admit: 2017-06-30 | Discharge: 2017-06-30 | Disposition: A | Discharge status: Home / Self Care | Payer: 59 | Source: Ambulatory Visit | Attending: Interventional Cardiology | Admitting: Interventional Cardiology

## 2017-06-30 ENCOUNTER — Inpatient Hospital Stay (HOSPITAL_COMMUNITY)
Admission: RE | Admit: 2017-06-30 | Discharge: 2017-06-30 | Disposition: A | Discharge status: Home / Self Care | Payer: 59 | Source: Ambulatory Visit

## 2017-06-30 DIAGNOSIS — Z955 Presence of coronary angioplasty implant and graft: Secondary | ICD-10-CM

## 2017-06-30 DIAGNOSIS — Z713 Dietary counseling and surveillance: Secondary | ICD-10-CM | POA: Diagnosis not present

## 2017-07-03 ENCOUNTER — Encounter (HOSPITAL_COMMUNITY): Payer: 59

## 2017-07-03 ENCOUNTER — Encounter (HOSPITAL_COMMUNITY): Admission: RE | Admit: 2017-07-03 | Payer: 59 | Source: Ambulatory Visit

## 2017-07-03 ENCOUNTER — Telehealth (HOSPITAL_COMMUNITY): Payer: Self-pay | Admitting: *Deleted

## 2017-07-05 ENCOUNTER — Encounter (HOSPITAL_COMMUNITY)
Admission: RE | Admit: 2017-07-05 | Discharge: 2017-07-05 | Disposition: A | Discharge status: Home / Self Care | Payer: 59 | Source: Ambulatory Visit | Attending: Interventional Cardiology | Admitting: Interventional Cardiology

## 2017-07-05 ENCOUNTER — Encounter (HOSPITAL_COMMUNITY): Payer: 59

## 2017-07-05 DIAGNOSIS — Z713 Dietary counseling and surveillance: Secondary | ICD-10-CM | POA: Diagnosis not present

## 2017-07-05 DIAGNOSIS — Z955 Presence of coronary angioplasty implant and graft: Secondary | ICD-10-CM

## 2017-07-07 ENCOUNTER — Encounter (HOSPITAL_COMMUNITY): Payer: 59

## 2017-07-07 ENCOUNTER — Encounter (HOSPITAL_COMMUNITY)
Admission: RE | Admit: 2017-07-07 | Discharge: 2017-07-07 | Disposition: A | Discharge status: Home / Self Care | Payer: 59 | Source: Ambulatory Visit | Attending: Interventional Cardiology | Admitting: Interventional Cardiology

## 2017-07-07 DIAGNOSIS — Z955 Presence of coronary angioplasty implant and graft: Secondary | ICD-10-CM

## 2017-07-07 DIAGNOSIS — Z713 Dietary counseling and surveillance: Secondary | ICD-10-CM | POA: Diagnosis not present

## 2017-07-07 LAB — GLUCOSE, CAPILLARY: Glucose-Capillary: 210 mg/dL — ABNORMAL HIGH (ref 65–99)

## 2017-07-10 ENCOUNTER — Encounter (HOSPITAL_COMMUNITY): Payer: 59

## 2017-07-12 ENCOUNTER — Encounter (HOSPITAL_COMMUNITY): Payer: 59

## 2017-07-12 ENCOUNTER — Telehealth (HOSPITAL_COMMUNITY): Payer: Self-pay | Admitting: Family Medicine

## 2017-07-14 ENCOUNTER — Encounter (HOSPITAL_COMMUNITY): Payer: 59

## 2017-07-17 ENCOUNTER — Encounter (HOSPITAL_COMMUNITY): Payer: 59

## 2017-07-17 ENCOUNTER — Encounter (HOSPITAL_COMMUNITY)
Admission: RE | Admit: 2017-07-17 | Discharge: 2017-07-17 | Disposition: A | Discharge status: Home / Self Care | Payer: 59 | Source: Ambulatory Visit | Attending: Interventional Cardiology | Admitting: Interventional Cardiology

## 2017-07-17 DIAGNOSIS — Z955 Presence of coronary angioplasty implant and graft: Secondary | ICD-10-CM

## 2017-07-17 DIAGNOSIS — Z713 Dietary counseling and surveillance: Secondary | ICD-10-CM | POA: Diagnosis not present

## 2017-07-17 DIAGNOSIS — M545 Low back pain: Secondary | ICD-10-CM | POA: Diagnosis not present

## 2017-07-19 ENCOUNTER — Encounter (HOSPITAL_COMMUNITY)
Admission: RE | Admit: 2017-07-19 | Discharge: 2017-07-19 | Disposition: A | Discharge status: Home / Self Care | Payer: 59 | Source: Ambulatory Visit | Attending: Interventional Cardiology | Admitting: Interventional Cardiology

## 2017-07-19 ENCOUNTER — Encounter (HOSPITAL_COMMUNITY): Payer: 59

## 2017-07-19 DIAGNOSIS — Z713 Dietary counseling and surveillance: Secondary | ICD-10-CM | POA: Diagnosis not present

## 2017-07-19 DIAGNOSIS — Z955 Presence of coronary angioplasty implant and graft: Secondary | ICD-10-CM

## 2017-07-21 ENCOUNTER — Encounter (HOSPITAL_COMMUNITY)
Admission: RE | Admit: 2017-07-21 | Discharge: 2017-07-21 | Disposition: A | Discharge status: Home / Self Care | Payer: 59 | Source: Ambulatory Visit | Attending: Interventional Cardiology | Admitting: Interventional Cardiology

## 2017-07-21 ENCOUNTER — Encounter (HOSPITAL_COMMUNITY): Payer: 59

## 2017-07-21 DIAGNOSIS — K219 Gastro-esophageal reflux disease without esophagitis: Secondary | ICD-10-CM | POA: Insufficient documentation

## 2017-07-21 DIAGNOSIS — M1990 Unspecified osteoarthritis, unspecified site: Secondary | ICD-10-CM | POA: Diagnosis not present

## 2017-07-21 DIAGNOSIS — I10 Essential (primary) hypertension: Secondary | ICD-10-CM | POA: Insufficient documentation

## 2017-07-21 DIAGNOSIS — Z955 Presence of coronary angioplasty implant and graft: Secondary | ICD-10-CM

## 2017-07-21 DIAGNOSIS — I2510 Atherosclerotic heart disease of native coronary artery without angina pectoris: Secondary | ICD-10-CM | POA: Insufficient documentation

## 2017-07-21 DIAGNOSIS — E119 Type 2 diabetes mellitus without complications: Secondary | ICD-10-CM | POA: Insufficient documentation

## 2017-07-21 DIAGNOSIS — Z713 Dietary counseling and surveillance: Secondary | ICD-10-CM | POA: Insufficient documentation

## 2017-07-21 DIAGNOSIS — E7800 Pure hypercholesterolemia, unspecified: Secondary | ICD-10-CM | POA: Insufficient documentation

## 2017-07-21 DIAGNOSIS — E669 Obesity, unspecified: Secondary | ICD-10-CM | POA: Diagnosis not present

## 2017-07-21 NOTE — Progress Notes (Signed)
Cardiac Individual Treatment Plan  Patient Details  Name: Troy Martinez MRN: [id number] Date of Birth: Feb 02, 1961 Referring Provider:     CARDIAC REHAB PHASE II ORIENTATION from 04/18/2017 in [place]  Referring Provider  Casandra Doffing MD      Initial Encounter Date:    CARDIAC REHAB PHASE II ORIENTATION from 04/18/2017 in [place]  Date  04/18/17  Referring Provider  Casandra Doffing MD      Visit Diagnosis: 04/06/17 Status post coronary artery stent placement  Patient's Home Medications on Admission:  Current Outpatient Prescriptions:  .  acetaminophen (TYLENOL) 500 MG tablet, Take 2 tablets (1,000 mg total) by mouth daily as needed for moderate pain or headache., Disp: , Rfl:  .  Ascorbic Acid (VITAMIN C) 500 MG CHEW, Chew 250 mg by mouth daily., Disp: , Rfl:  .  atorvastatin (LIPITOR) 80 MG tablet, TAKE 1 TABLET (80 MG TOTAL) BY MOUTH DAILY., Disp: 30 tablet, Rfl: 8 .  buPROPion (WELLBUTRIN SR) 150 MG 12 hr tablet, Take 150 mg by mouth 2 (two) times daily., Disp: , Rfl: 1 .  Cyanocobalamin (B-12 COMPLIANCE INJECTION) 1000 MCG/ML KIT, Inject as directed every 30 (thirty) days. , Disp: , Rfl:  .  esomeprazole (NEXIUM) 20 MG capsule, Take 20 mg by mouth daily at 12 noon., Disp: , Rfl:  .  Fe-Succ Ac-C-Thre Ac-B12-FA (FERREX 150 FORTE PLUS) 50-100 MG CAPS, Take 1 capsule by mouth every evening. , Disp: , Rfl:  .  fenofibrate 160 MG tablet, Take 160 mg by mouth every evening. , Disp: , Rfl:  .  fluticasone (FLONASE) 50 MCG/ACT nasal spray, Place 2 sprays into both nostrils 2 (two) times daily., Disp: , Rfl:  .  gabapentin (NEURONTIN) 300 MG capsule, Take 1 capsule (300 mg) by mouth each morning and 2 capsule (600 mg) by mouth each evening., Disp: , Rfl:  .  glimepiride (AMARYL) 1 MG tablet, Take 0.5 mg by mouth daily with breakfast. , Disp: , Rfl:  .  JANUMET XR 50-1000 MG TB24, Take 1 tablet by mouth 2 (two) times daily.  , Disp: , Rfl: 3 .  lisinopril (PRINIVIL,ZESTRIL) 10 MG tablet, Take 5 mg by mouth daily., Disp: , Rfl: 12 .  Multiple Vitamins-Minerals (MULTIVITAMIN PO), Take 1 tablet by mouth daily., Disp: , Rfl:  .  nebivolol (BYSTOLIC) 5 MG tablet, Take 5 mg by mouth every evening. , Disp: , Rfl:  .  nitroGLYCERIN (NITROSTAT) 0.4 MG SL tablet, Place 0.4 mg under the tongue every 5 (five) minutes as needed for chest pain. 3 DOSES MAX, Disp: , Rfl:  .  ONE TOUCH ULTRA TEST test strip, 1 each. , Disp: , Rfl:  .  prasugrel (EFFIENT) 10 MG TABS tablet, TAKE 1 TABLET BY MOUTH EVERY DAY, Disp: 30 tablet, Rfl: 5 .  venlafaxine XR (EFFEXOR-XR) 75 MG 24 hr capsule, Take 75 mg by mouth every evening. , Disp: , Rfl: 2  Past Medical History: Past Medical History:  Diagnosis Date  . Arthritis   . Coronary artery disease    a. DES to LCx (2008)  b. DES to RCA (2012) c. Overlapping DESx2 to large ramus (03/2014). d. [place] 03/2017 s/p cutting ballon angioplasty to ramus 2.  . GERD (gastroesophageal reflux disease)   . High cholesterol   . Hypertension   . Obesity   . Type II diabetes mellitus (HCC)     Tobacco Use: History  Smoking Status  .  Former Smoker  . Packs/day: 3.00  . Years: 23.00  . Types: Cigarettes  . Quit date: 01/18/1999  Smokeless Tobacco  . Never Used    Labs: Recent Review Flowsheet Data    Labs for ITP Cardiac and Pulmonary Rehab Latest Ref Rng & Units 07/08/2008 12/01/2016   Cholestrol 100 - 199 mg/dL - 123   LDLCALC 0 - 99 mg/dL - 61   HDL >39 mg/dL - 24(L)   Trlycerides 0 - 149 mg/dL - 192(H)   TCO2 - 28 -      Capillary Blood Glucose: Lab Results  Component Value Date   GLUCAP 210 (H) 07/07/2017   GLUCAP 254 (H) 05/19/2017   GLUCAP 149 (H) 05/01/2017   GLUCAP 139 (H) 04/26/2017   GLUCAP 180 (H) 04/26/2017     Exercise Target Goals:    Exercise Program Goal: Individual exercise prescription set with THRR, safety & activity barriers. Participant demonstrates ability to  understand and report RPE using BORG scale, to self-measure pulse accurately, and to acknowledge the importance of the exercise prescription.  Exercise Prescription Goal: Starting with aerobic activity 30 plus minutes a day, 3 days per week for initial exercise prescription. Provide home exercise prescription and guidelines that participant acknowledges understanding prior to discharge.  Activity Barriers & Risk Stratification:     Activity Barriers & Cardiac Risk Stratification - 04/18/17 0857      Activity Barriers & Cardiac Risk Stratification   Activity Barriers Back Problems;Other (comment)   Comments R knee discomfort   Cardiac Risk Stratification High      6 Minute Walk:     6 Minute Walk    Row Name 04/18/17 1144         6 Minute Walk   Phase Initial     Distance 1863 feet     Walk Time 6 minutes     # of Rest Breaks 0     MPH 3.53     METS 4.5     RPE 11     VO2 Peak 15.6     Symptoms Yes (comment)     Comments heavier breathing at end of walk test     Resting HR 67 bpm     Resting BP 128/60     Max Ex. HR 103 bpm     Max Ex. BP 122/72     2 Minute Post BP 116/66        Oxygen Initial Assessment:   Oxygen Re-Evaluation:   Oxygen Discharge (Final Oxygen Re-Evaluation):   Initial Exercise Prescription:     Initial Exercise Prescription - 04/18/17 1100      Date of Initial Exercise RX and Referring Provider   Date 04/18/17   Referring Provider Casandra Doffing MD     Treadmill   MPH 2.8   Grade 1   Minutes 10   METs 3.53     Bike   Level 1.2   Minutes 10   METs 3.38     NuStep   Level 3   SPM 80   Minutes 10   METs 2.5     Prescription Details   Frequency (times per week) 3   Duration Progress to 30 minutes of continuous aerobic without signs/symptoms of physical distress     Intensity   THRR 40-80% of Max Heartrate 66-132   Ratings of Perceived Exertion 11-15   Perceived Dyspnea 0-4     Progression   Progression Continue to  progress workloads to maintain intensity  without signs/symptoms of physical distress.     Resistance Training   Training Prescription Yes   Weight 3lbs   Reps 10-15      Perform Capillary Blood Glucose checks as needed.  Exercise Prescription Changes:     Exercise Prescription Changes    Row Name 04/26/17 1131 05/18/17 0800 05/29/17 1600 06/09/17 1215 06/27/17 1200     Response to Exercise   Blood Pressure (Admit) 128/72 134/64 124/74 108/70 128/80   Blood Pressure (Exercise) 144/78 164/70 150/70 154/70 162/70   Blood Pressure (Exit) 118/70 104/60 108/70 120/80 120/80   Heart Rate (Admit) 81 bpm 91 bpm 84 bpm 78 bpm 63 bpm   Heart Rate (Exercise) 110 bpm 126 bpm 121 bpm 124 bpm 136 bpm   Heart Rate (Exit) 81 bpm 88 bpm 91 bpm 88 bpm 80 bpm   Rating of Perceived Exertion (Exercise) _0 Symptoms none  none  low back pain. did wear back brace to manage pain with activity none none   Comments Pt was oriented to exercise equiment. Pt did well with exercise  -  -  - pt does HIIT   Duration Continue with 30 min of aerobic exercise without signs/symptoms of physical distress. Continue with 30 min of aerobic exercise without signs/symptoms of physical distress. Continue with 30 min of aerobic exercise without signs/symptoms of physical distress. Continue with 30 min of aerobic exercise without signs/symptoms of physical distress. Continue with 30 min of aerobic exercise without signs/symptoms of physical distress.   Intensity _1      Progression   Progression Continue to progress workloads to maintain intensity without signs/symptoms of physical distress. Continue to progress workloads to maintain intensity without signs/symptoms of physical distress. Continue to progress workloads to maintain intensity without signs/symptoms of physical distress. Continue to progress workloads to maintain intensity without  signs/symptoms of physical distress. Continue to progress workloads to maintain intensity without signs/symptoms of physical distress.   Average METs 3.8 5.1 5.4 5.9 6.4     Resistance Training   Training Prescription _2    Weight 5lbs 8lbs 8lbs 8lbs 8lbs   Reps 10-15 10-15 10-15 10-15 10-15   Time 10 Minutes 10 Minutes 10 Minutes 10 Minutes 10 Minutes     Treadmill   MPH 2.8 3 3.2 3.2 3.3  HIIT @ 3.3/10 for 2' and mod. intensity @ 3.3/3 for 8'   Grade _3 Minutes _4 METs 3.53 4.12 4.33 4.89 5.5     Bike   Level 1.2 2 2.5 2.5 2.5  HIIT _5 .3 for 2' and mod. intensity _6 .5 for 8'   Minutes _7 METs 3.38 5.13 6.17 6.25 6.99     NuStep   Level _8 HIIT @ level 8 for 2' and mod. intensity @ level 6 for 8'   SPM 90 100 95 95 95   Minutes _9 METs 3.9 6.1 5.7 6.5 6.7     Home Exercise Plan   Plans to continue exercise at  -  - Home (comment)  walking and using total gym Home (comment)  walking and using total gym Home (comment)  walking and using total gym   Frequency  -  - Add 2 additional days to program exercise sessions. Add 2 additional  days to program exercise sessions. Add 2 additional days to program exercise sessions.   Initial Home Exercises Provided  -  - 05/24/17 05/24/17 05/24/17   Row Name 07/17/17 1236             Response to Exercise   Blood Pressure (Admit) 118/68       Blood Pressure (Exercise) 142/82       Blood Pressure (Exit) 118/62       Heart Rate (Admit) 90 bpm       Heart Rate (Exercise) 133 bpm       Heart Rate (Exit) 86 bpm       Rating of Perceived Exertion (Exercise) 13       Symptoms none       Comments pt does HIIT       Duration Continue with 30 min of aerobic exercise without signs/symptoms of physical distress.       Intensity THRR unchanged         Progression   Progression Continue to progress workloads to maintain intensity without signs/symptoms of physical  distress.       Average METs 6.4         Resistance Training   Training Prescription Yes       Weight 8lbs       Reps 10-15       Time 10 Minutes         Treadmill   MPH 3.3  HIIT @ 3.3/10 for 2' and mod. intensity @ 3.3/3 for 8'       Grade 10       Minutes 10       METs 5.5         Bike   Level 2.5  HIIT _0 .3 for 2' and mod. intensity _1 .5 for 8'       Minutes 10       METs 7         NuStep   Level 6  HIIT @ level 8 for 2' and mod. intensity @ level 6 for 8'       SPM 95       Minutes 10       METs 6.6         Home Exercise Plan   Plans to continue exercise at Home (comment)  walking and using total gym       Frequency Add 2 additional days to program exercise sessions.       Initial Home Exercises Provided 05/24/17          Exercise Comments:     Exercise Comments    Row Name 05/02/17 1134 05/30/17 0819 06/27/17 1219 07/18/17 1241     Exercise Comments Pt was oriented to exercise equipment on 04/26/17. Pt responded well to exercise sessioin and reported no signs/symptoms of CP, dizziness or unusual SOB. Reviewed METs and goals. Pt is tolerating exercise very well; will continue to monitor pt's progress and activity levels. Reviewed METs and goals. Pt is tolerating exercise very well; will continue to monitor pt's progress and activity levels. Reviewed METs and goals. Pt is tolerating exercise very well; will continue to monitor pt's progress and activity levels.       Exercise Goals and Review:     Exercise Goals    Row Name 04/18/17 0859             Exercise Goals   Increase Physical Activity Yes       Intervention Provide  advice, education, support and counseling about physical activity/exercise needs.;Develop an individualized exercise prescription for aerobic and resistive training based on initial evaluation findings, risk stratification, comorbidities and participant's personal goals.       Expected Outcomes Achievement of increased cardiorespiratory  fitness and enhanced flexibility, muscular endurance and strength shown through measurements of functional capacity and personal statement of participant.       Increase Strength and Stamina Yes       Intervention Provide advice, education, support and counseling about physical activity/exercise needs.;Develop an individualized exercise prescription for aerobic and resistive training based on initial evaluation findings, risk stratification, comorbidities and participant's personal goals.       Expected Outcomes Achievement of increased cardiorespiratory fitness and enhanced flexibility, muscular endurance and strength shown through measurements of functional capacity and personal statement of participant.          Exercise Goals Re-Evaluation :     Exercise Goals Re-Evaluation    Row Name 05/24/17 1605 06/27/17 1219 07/18/17 1241         Exercise Goal Re-Evaluation   Exercise Goals Review Increase Physical Activity;Able to understand and use rate of perceived exertion (RPE) scale;Knowledge and understanding of Target Heart Rate Range (THRR);Understanding of Exercise Prescription;Increase Strength and Stamina Increase Physical Activity;Able to understand and use rate of perceived exertion (RPE) scale;Knowledge and understanding of Target Heart Rate Range (THRR);Understanding of Exercise Prescription;Increase Strength and Stamina Increase Physical Activity;Able to understand and use rate of perceived exertion (RPE) scale;Knowledge and understanding of Target Heart Rate Range (THRR);Understanding of Exercise Prescription;Increase Strength and Stamina;Able to check pulse independently     Comments Reviewed home exercise with pt today.  Pt plans to walk and use total gym for exercise, 3x/week in addition to coming to cardiac rehan.  Reviewed THR, pulse, RPE, sign and symptoms, NTG use, and when to call 911 or MD.  Also discussed weather considerations and indoor options.  Pt voiced understanding. Pt is  doing HIIT in cardiac rehab. Pt is showing great progress in MET and activity levels. Pt is also compliant with HEP, in which he walks for 30 minutes and uses total gym 2-3x/week. Discussed exercise limitations to ensure safety . Pt voiced understanding and is well aware of limitations.  Pt is making great progess and is handling HIIT very well. Pt is also compliant with HEP in which he goes to the gym with wife.      Expected Outcomes Pt will be compliant with HEP and improve in cardiorespiratory fitness and functional capacity. Pt will be compliant with HEP and improve in cardiorespiratory fitness and functional capacity. Pt will be compliant with HEP and improve in cardiorespiratory fitness and functional capacity.         Discharge Exercise Prescription (Final Exercise Prescription Changes):     Exercise Prescription Changes - 07/17/17 1236      Response to Exercise   Blood Pressure (Admit) 118/68   Blood Pressure (Exercise) 142/82   Blood Pressure (Exit) 118/62   Heart Rate (Admit) 90 bpm   Heart Rate (Exercise) 133 bpm   Heart Rate (Exit) 86 bpm   Rating of Perceived Exertion (Exercise) 13   Symptoms none   Comments pt does HIIT   Duration Continue with 30 min of aerobic exercise without signs/symptoms of physical distress.   Intensity THRR unchanged     Progression   Progression Continue to progress workloads to maintain intensity without signs/symptoms of physical distress.   Average METs 6.4  Resistance Training   Training Prescription Yes   Weight 8lbs   Reps 10-15   Time 10 Minutes     Treadmill   MPH 3.3  HIIT @ 3.3/10 for 2' and mod. intensity @ 3.3/3 for 8'   Grade 10   Minutes 10   METs 5.5     Bike   Level 2.5  HIIT _0 .3 for 2' and mod. intensity _1 .5 for 8'   Minutes 10   METs 7     NuStep   Level 6  HIIT @ level 8 for 2' and mod. intensity @ level 6 for 8'   SPM 95   Minutes 10   METs 6.6     Home Exercise Plan   Plans to continue exercise  at Home (comment)  walking and using total gym   Frequency Add 2 additional days to program exercise sessions.   Initial Home Exercises Provided 05/24/17      Nutrition:  Target Goals: Understanding of nutrition guidelines, daily intake of sodium <1578m, cholesterol <2083m calories 30% from fat and 7% or less from saturated fats, daily to have 5 or more servings of fruits and vegetables.  Biometrics:     Pre Biometrics - 04/18/17 1149      Pre Biometrics   Waist Circumference 43 inches   Hip Circumference 42.5 inches   Waist to Hip Ratio 1.01 %   Triceps Skinfold 25 mm   % Body Fat 32 %   Grip Strength 51 kg   Flexibility 12 in   Single Leg Stand 14.21 seconds       Nutrition Therapy Plan and Nutrition Goals:     Nutrition Therapy & Goals - 04/18/17 1207      Nutrition Therapy   Diet Carb Modified, Therapeutic Lifestyle Changes     Personal Nutrition Goals   Nutrition Goal Pt to identify food quantities necessary to achieve weight loss of 6-24 lb (2.7-10.9 kg) at graduation from cardiac rehab. Goal wt of 170 lb desired.    Personal Goal #2 CBG concentrations in the normal range or as close to normal as is safely possible.     Intervention Plan   Intervention Prescribe, educate and counsel regarding individualized specific dietary modifications aiming towards targeted core components such as weight, hypertension, lipid management, diabetes, heart failure and other comorbidities.   Expected Outcomes Short Term Goal: Understand basic principles of dietary content, such as calories, fat, sodium, cholesterol and nutrients.;Long Term Goal: Adherence to prescribed nutrition plan.      Nutrition Discharge: Nutrition Scores:     Nutrition Assessments - 04/18/17 1128      MEDFICTS Scores   Pre Score 32      Nutrition Goals Re-Evaluation:   Nutrition Goals Re-Evaluation:   Nutrition Goals Discharge (Final Nutrition Goals Re-Evaluation):   Psychosocial: Target  Goals: Acknowledge presence or absence of significant depression and/or stress, maximize coping skills, provide positive support system. Participant is able to verbalize types and ability to use techniques and skills needed for reducing stress and depression.  Initial Review & Psychosocial Screening:     Initial Psych Review & Screening - 04/28/17 1208      Initial Review   Current issues with None Identified     Family Dynamics   Good Support System? Yes   Comments no psychosocial needs identified, no interventions necessary      Barriers   Psychosocial barriers to participate in program There are no identifiable barriers or psychosocial needs.  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 pt has learned how to cope. pt also sees therapist for depression which is currenlty stable.    GLOBAL Pre --  overall pt concerns are depression related, pt states symptoms are presently well managed and verablizes when to contact therapist/psychiatry if symptoms change.  pt offered emotional support and reassurance.      PHQ-9: Recent Review Flowsheet Data    Depression screen [facility] 2/9 04/28/2017 09/22/2016   Decreased Interest 0 0   Down, Depressed, Hopeless 0  0   PHQ - 2 Score 0 0     Interpretation of Total Score  Total Score Depression Severity:  1-4 = Minimal depression, 5-9 = Mild depression, 10-14 = Moderate depression, 15-19 = Moderately severe depression, 20-27 = Severe depression   Psychosocial Evaluation and Intervention:     Psychosocial Evaluation - 04/28/17 1212      Psychosocial Evaluation & Interventions   Interventions Stress management education;Relaxation education;Encouraged to exercise with the program and follow  exercise prescription   Comments pt with history of depression currently well managed with effoxor, wellbutrin and therapy   Expected Outcomes pt will exhibit maintenance of well managed depression, positive outlook and good coping skills    Continue Psychosocial Services  Follow up required by staff      Psychosocial Re-Evaluation:     Psychosocial Re-Evaluation    Brenda Name 05/02/17 0731 05/31/17 1111 06/21/17 1023 07/18/17 0758       Psychosocial Re-Evaluation   Current issues with History of Depression;Current Depression History of Depression;Current Depression History of Depression;Current Depression History of Depression;Current Depression    Comments pt with known history of depression, symptoms well managed on current treatment.   pt with known history of depression, symptoms well managed on current treatment.   pt with known history of depression, symptoms well managed on current treatment.   pt with known history of depression, symptoms well managed on current treatment.      Expected Outcomes pt will demonstrate good coping skills with positive outlook. pt will demonstrate good coping skills with positive outlook. pt will demonstrate good coping skills with positive outlook. pt will demonstrate good coping skills with positive outlook.    Interventions Relaxation education;Stress management education;Encouraged to attend Cardiac Rehabilitation for the exercise Relaxation education;Stress management education;Encouraged to attend Cardiac Rehabilitation for the exercise Relaxation education;Stress management education;Encouraged to attend Cardiac Rehabilitation for the exercise Relaxation education;Stress management education;Encouraged to attend Cardiac Rehabilitation for the exercise    Continue Psychosocial Services  Follow up required by staff Follow up required by staff Follow up required by staff Follow up required by staff       Psychosocial Discharge (Final Psychosocial  Re-Evaluation):     Psychosocial Re-Evaluation - 07/18/17 0758      Psychosocial Re-Evaluation   Current issues with History of Depression;Current Depression   Comments pt with known history of depression, symptoms well managed on current treatment.     Expected Outcomes pt will demonstrate good coping skills with positive outlook.   Interventions Relaxation education;Stress management education;Encouraged to attend Cardiac Rehabilitation for the exercise   Continue Psychosocial Services  Follow up required by staff  Vocational Rehabilitation: Provide vocational rehab assistance to qualifying candidates.   Vocational Rehab Evaluation & Intervention:     Vocational Rehab - 04/28/17 1207      Initial Vocational Rehab Evaluation & Intervention   Assessment shows need for Vocational Rehabilitation No      Education: Education Goals: Education classes will be provided on a weekly basis, covering required topics. Participant will state understanding/return demonstration of topics presented.  Learning Barriers/Preferences:     Learning Barriers/Preferences - 04/18/17 0859      Learning Barriers/Preferences   Learning Preferences Skilled Demonstration;Written Material;Computer/Internet      Education Topics: Count Your Pulse:  -Group instruction provided by verbal instruction, demonstration, patient participation and written materials to support subject.  Instructors address importance of being able to find your pulse and how to count your pulse when at home without a heart monitor.  Patients get hands on experience counting their pulse with staff help and individually.   CARDIAC REHAB PHASE II EXERCISE from 07/07/2017 in [place]  Date  05/26/17  Instruction Review Code  2- meets goals/outcomes      Heart Attack, Angina, and Risk Factor Modification:  -Group instruction provided by verbal instruction, video, and written materials to  support subject.  Instructors address signs and symptoms of angina and heart attacks.    Also discuss risk factors for heart disease and how to make changes to improve heart health risk factors.   CARDIAC REHAB PHASE II EXERCISE from 07/07/2017 in [place]  Date  06/07/17  Instruction Review Code  2- meets goals/outcomes      Functional Fitness:  -Group instruction provided by verbal instruction, demonstration, patient participation, and written materials to support subject.  Instructors address safety measures for doing things around the house.  Discuss how to get up and down off the floor, how to pick things up properly, how to safely get out of a chair without assistance, and balance training.   CARDIAC REHAB PHASE II EXERCISE from 07/07/2017 in [place]  Date  07/07/17  Instruction Review Code  2- meets goals/outcomes      Meditation and Mindfulness:  -Group instruction provided by verbal instruction, patient participation, and written materials to support subject.  Instructor addresses importance of mindfulness and meditation practice to help reduce stress and improve awareness.  Instructor also leads participants through a meditation exercise.    CARDIAC REHAB PHASE II EXERCISE from 07/07/2017 in [place]  Date  05/03/17  Instruction Review Code  2- meets goals/outcomes      Stretching for Flexibility and Mobility:  -Group instruction provided by verbal instruction, patient participation, and written materials to support subject.  Instructors lead participants through series of stretches that are designed to increase flexibility thus improving mobility.  These stretches are additional exercise for major muscle groups that are typically performed during regular warm up and cool down.   CARDIAC REHAB PHASE II EXERCISE from 07/07/2017 in [place]  Date   06/23/17  Instruction Review Code  2- meets goals/outcomes      Hands Only CPR:  -Group verbal, video, and participation provides a basic overview of AHA guidelines for community CPR. Role-play of emergencies allow participants the opportunity to practice calling for help and chest compression technique with discussion of AED use.   Hypertension: -Group verbal and written instruction that provides a basic overview of hypertension including  the most recent diagnostic guidelines, risk factor reduction with self-care instructions and medication management.   CARDIAC REHAB PHASE II EXERCISE from 07/07/2017 in [place]  Date  06/16/17  Instruction Review Code  2- meets goals/outcomes       Nutrition I class: Heart Healthy Eating:  -Group instruction provided by PowerPoint slides, verbal discussion, and written materials to support subject matter. The instructor gives an explanation and review of the Therapeutic Lifestyle Changes diet recommendations, which includes a discussion on lipid goals, dietary fat, sodium, fiber, plant stanol/sterol esters, sugar, and the components of a well-balanced, healthy diet.   Nutrition II class: Lifestyle Skills:  -Group instruction provided by PowerPoint slides, verbal discussion, and written materials to support subject matter. The instructor gives an explanation and review of label reading, grocery shopping for heart health, heart healthy recipe modifications, and ways to make healthier choices when eating out.   Diabetes Question & Answer:  -Group instruction provided by PowerPoint slides, verbal discussion, and written materials to support subject matter. The instructor gives an explanation and review of diabetes co-morbidities, pre- and post-prandial blood glucose goals, pre-exercise blood glucose goals, signs, symptoms, and treatment of hypoglycemia and hyperglycemia, and foot care basics.   CARDIAC REHAB PHASE II  EXERCISE from 07/07/2017 in [place]  Date  06/30/17  Educator  RD  Instruction Review Code  2- meets goals/outcomes      Diabetes Blitz:  -Group instruction provided by PowerPoint slides, verbal discussion, and written materials to support subject matter. The instructor gives an explanation and review of the physiology behind type 1 and type 2 diabetes, diabetes medications and rational behind using different medications, pre- and post-prandial blood glucose recommendations and Hemoglobin A1c goals, diabetes diet, and exercise including blood glucose guidelines for exercising safely.    Portion Distortion:  -Group instruction provided by PowerPoint slides, verbal discussion, written materials, and food models to support subject matter. The instructor gives an explanation of serving size versus portion size, changes in portions sizes over the last 20 years, and what consists of a serving from each food group.   Stress Management:  -Group instruction provided by verbal instruction, video, and written materials to support subject matter.  Instructors review role of stress in heart disease and how to cope with stress positively.     CARDIAC REHAB PHASE II EXERCISE from 07/07/2017 in [place]  Date  05/17/17  Instruction Review Code  2- meets goals/outcomes      Exercising on Your Own:  -Group instruction provided by verbal instruction, power point, and written materials to support subject.  Instructors discuss benefits of exercise, components of exercise, frequency and intensity of exercise, and end points for exercise.  Also discuss use of nitroglycerin and activating EMS.  Review options of places to exercise outside of rehab.  Review guidelines for sex with heart disease.   CARDIAC REHAB PHASE II EXERCISE from 07/07/2017 in [place]  Date  05/24/17  Instruction Review Code  2- meets  goals/outcomes      Cardiac Drugs I:  -Group instruction provided by verbal instruction and written materials to support subject.  Instructor reviews cardiac drug classes: antiplatelets, anticoagulants, beta blockers, and statins.  Instructor discusses reasons, side effects, and lifestyle considerations for each drug class.   CARDIAC REHAB PHASE II EXERCISE from 07/07/2017 in [place]  Date  05/31/17  Educator  jackie  Instruction Review Code  2- meets goals/outcomes      Cardiac Drugs II:  -Group instruction provided by verbal instruction and written materials to support subject.  Instructor reviews cardiac drug classes: angiotensin converting enzyme inhibitors (ACE-I), angiotensin II receptor blockers (ARBs), nitrates, and calcium channel blockers.  Instructor discusses reasons, side effects, and lifestyle considerations for each drug class.   Anatomy and Physiology of the Circulatory System:  Group verbal and written instruction and models provide basic cardiac anatomy and physiology, with the coronary electrical and arterial systems. Review of: AMI, Angina, Valve disease, Heart Failure, Peripheral Artery Disease, Cardiac Arrhythmia, Pacemakers, and the ICD.   CARDIAC REHAB PHASE II EXERCISE from 07/07/2017 in [place]  Date  06/14/17  Educator  RN  Instruction Review Code  2- meets goals/outcomes      Other Education:  -Group or individual verbal, written, or video instructions that support the educational goals of the cardiac rehab program.   Knowledge Questionnaire Score:     Knowledge Questionnaire Score - 04/28/17 1206      Knowledge Questionnaire Score   Pre Score 20/24 cardiac, DM 13/15      Core Components/Risk Factors/Patient Goals at Admission:     Personal Goals and Risk Factors at Admission - 04/18/17 1150      Core Components/Risk Factors/Patient Goals on Admission   Diabetes Yes    Intervention Provide education about signs/symptoms and action to take for hypo/hyperglycemia.;Provide education about proper nutrition, including hydration, and aerobic/resistive exercise prescription along with prescribed medications to achieve blood glucose in normal ranges: Fasting glucose 65-99 mg/dL   Expected Outcomes Short Term: Participant verbalizes understanding of the signs/symptoms and immediate care of hyper/hypoglycemia, proper foot care and importance of medication, aerobic/resistive exercise and nutrition plan for blood glucose control.;Long Term: Attainment of HbA1C < 7%.   Hypertension Yes   Intervention Provide education on lifestyle modifcations including regular physical activity/exercise, weight management, moderate sodium restriction and increased consumption of fresh fruit, vegetables, and low fat dairy, alcohol moderation, and smoking cessation.;Monitor prescription use compliance.   Expected Outcomes Short Term: Continued assessment and intervention until BP is < 140/41m HG in hypertensive participants. < 130/873mHG in hypertensive participants with diabetes, heart failure or chronic kidney disease.;Long Term: Maintenance of blood pressure at goal levels.   Lipids Yes   Intervention Provide education and support for participant on nutrition & aerobic/resistive exercise along with prescribed medications to achieve LDL <7078mHDL >86m54m Expected Outcomes Short Term: Participant states understanding of desired cholesterol values and is compliant with medications prescribed. Participant is following exercise prescription and nutrition guidelines.;Long Term: Cholesterol controlled with medications as prescribed, with individualized exercise RX and with personalized nutrition plan. Value goals: LDL < 70mg95mL > 40 mg.      Core Components/Risk Factors/Patient Goals Review:      Goals and Risk Factor Review    Row Name 05/02/17 0728 05/31/17 1109 06/21/17 1022 07/18/17 0758        Core Components/Risk Factors/Patient Goals Review   Personal Goals Review Weight Management/Obesity;Hypertension;Lipids;Diabetes Weight Management/Obesity;Hypertension;Lipids;Diabetes Weight Management/Obesity;Hypertension;Lipids;Diabetes Weight Management/Obesity;Hypertension;Lipids;Diabetes    Review pt with multiple risk factors demonstrates eagerness to participate in CR activities. pt with multiple risk factors demonstrates eagerness to participate in CR activities. pt activity progression somewhat limited by back pain.  pt encouraged to continue stretches and core strengthening exercises as prescribed by physical therapy.   pt with multiple risk factors demonstrates eagerness to participate in  CR activities. pt enjoying HIIT participation.   pt with multiple risk factors demonstrates eagerness to participate in CR activities. pt enjoying HIIT participation.  Participation somewhat limited by chronic back pain    Expected Outcomes pt will participate in CR exercise, nutrition and lifestyle modification activities to decrease overall CAD risk factors  pt will participate in CR exercise, nutrition and lifestyle modification activities to decrease overall CAD risk factors  pt will participate in CR exercise, nutrition and lifestyle modification activities to decrease overall CAD risk factors  pt will participate in CR exercise, nutrition and lifestyle modification activities to decrease overall CAD risk factors        Core Components/Risk Factors/Patient Goals at Discharge (Final Review):      Goals and Risk Factor Review - 07/18/17 0758      Core Components/Risk Factors/Patient Goals Review   Personal Goals Review Weight Management/Obesity;Hypertension;Lipids;Diabetes   Review pt with multiple risk factors demonstrates eagerness to participate in CR activities. pt enjoying HIIT participation.  Participation somewhat limited by chronic back pain   Expected Outcomes pt will participate in CR  exercise, nutrition and lifestyle modification activities to decrease overall CAD risk factors       ITP Comments:     ITP Comments    Row Name 04/18/17 0854 05/02/17 0726 05/31/17 1109 06/27/17 1536 07/18/17 0758   ITP Comments Medical Director; Dr. Fransico Him Medical Director; Dr. Fransico Him Medical Director; Dr. Fransico Him 30 day ITP review.  Pt with good participation and attendance.  No change in current regimen unless directed by Medical Director.   30 day ITP review.  Pt with good participation and attendance.  No change in current regimen unless directed by Medical Director.        Comments:

## 2017-07-24 ENCOUNTER — Encounter (HOSPITAL_COMMUNITY): Payer: 59

## 2017-07-24 ENCOUNTER — Encounter (HOSPITAL_COMMUNITY): Admission: RE | Admit: 2017-07-24 | Payer: 59 | Source: Ambulatory Visit

## 2017-07-25 NOTE — Telephone Encounter (Signed)
-----   Message from Corky CraftsJayadeep S Varanasi, MD sent at 06/14/2017  7:02 PM EDT ----- Regarding: RE: HIIT I agree with HIIT  JV ----- Message ----- From: Warrick ParisianFair, Ayeshia Coppin D Sent: 06/14/2017   9:41 AM To: Corky CraftsJayadeep S Varanasi, MD Subject: HIIT                                           Your patient Troy Martinez is interested in doing high intensity interval training (HIIT) in Cardiac Rehab.  They have been in program for 6 weeks and have been doing great.  We would like to change their exercise prescription to include HIIT.  Their current THR is 66-132 (40-80%) and we would like to increase it to 156 max (95 %) for HIIT.  Their RPE levels for the HIIT would reach up to 16-17 and active rest would be 11-13.  They would start at 2 min of active rest and 30 sec of high intensity and progress as tolerated. If you are agreeable to this change in exercise prescription please let us know.   Thanks so much for your help!   Phyllis Abelson Genuine PartsFair,MS,ACSM RCEP

## 2017-07-26 ENCOUNTER — Encounter (HOSPITAL_COMMUNITY): Payer: 59

## 2017-07-26 ENCOUNTER — Encounter (HOSPITAL_COMMUNITY)
Admission: RE | Admit: 2017-07-26 | Discharge: 2017-07-26 | Disposition: A | Discharge status: Home / Self Care | Payer: 59 | Source: Ambulatory Visit | Attending: Interventional Cardiology | Admitting: Interventional Cardiology

## 2017-07-26 ENCOUNTER — Encounter (HOSPITAL_COMMUNITY)
Admission: RE | Admit: 2017-07-26 | Discharge: 2017-07-26 | Disposition: A | Discharge status: Home / Self Care | Payer: 59 | Source: Ambulatory Visit

## 2017-07-26 DIAGNOSIS — Z955 Presence of coronary angioplasty implant and graft: Secondary | ICD-10-CM

## 2017-07-26 DIAGNOSIS — Z713 Dietary counseling and surveillance: Secondary | ICD-10-CM | POA: Diagnosis not present

## 2017-07-28 ENCOUNTER — Encounter (HOSPITAL_COMMUNITY): Payer: 59

## 2017-07-28 DIAGNOSIS — D510 Vitamin B12 deficiency anemia due to intrinsic factor deficiency: Secondary | ICD-10-CM | POA: Diagnosis not present

## 2017-08-10 ENCOUNTER — Other Ambulatory Visit: Payer: Self-pay | Admitting: Interventional Cardiology

## 2017-08-22 DIAGNOSIS — D510 Vitamin B12 deficiency anemia due to intrinsic factor deficiency: Secondary | ICD-10-CM | POA: Diagnosis not present

## 2017-08-24 NOTE — Addendum Note (Signed)
Encounter addended by: York CeriseNevels, Kista Robb R on: 08/24/2017 12:17 PM  Actions taken: Visit Navigator Flowsheet section accepted

## 2017-09-08 NOTE — Addendum Note (Signed)
Encounter addended by: Jacques EarthlyFranko, Tahirih Lair Brewbaker, RD on: 09/08/2017 10:14 AM  Actions taken: Flowsheet data copied forward, Visit Navigator Flowsheet section accepted

## 2017-09-13 ENCOUNTER — Encounter (HOSPITAL_COMMUNITY): Payer: Self-pay

## 2017-09-13 NOTE — Progress Notes (Signed)
Discharge Progress Report  Patient Details  Name: Troy Martinez MRN: [id number] Date of Birth: 1960/11/26 Referring Provider:     CARDIAC [facility]HAB [facility]  Referring Provider  Everette RankVaranasi, Jay MD       Number of Visits: 30 Reason for Discharge:  Early Exit:  Personal  Smoking History:  Social History   Tobacco Use  Smoking Status Former Smoker  . Packs/day: 3.00  . Years: 23.00  . Pack years: 69.00  . Types: Cigarettes  . Last attempt to quit: 01/18/1999  . Years since quitting: 18.6  Smokeless Tobacco Never Used    Diagnosis:  04/06/17 Status post coronary artery stent placement  ADL UCSD:   Initial Exercise Prescription: Initial Exercise Prescription - 04/18/17 1100      Date of Initial Exercise RX and Referring Provider   Date  04/18/17    Referring Provider  Everette RankVaranasi, Jay MD      Treadmill   MPH  2.8    Grade  1    Minutes  10    METs  3.53      Bike   Level  1.2    Minutes  10    METs  3.38      NuStep   Level  3    SPM  80    Minutes  10    METs  2.5      Prescription Details   Frequency (times per week)  3    Duration  Progress to 30 minutes of continuous aerobic without signs/symptoms of physical distress      Intensity   THRR 40-80% of Max Heartrate  66-132    Ratings of Perceived Exertion  11-15    Perceived Dyspnea  0-4      Progression   Progression  Continue to progress workloads to maintain intensity without signs/symptoms of physical distress.      Resistance Training   Training Prescription  Yes    Weight  3lbs    Reps  10-15       Discharge Exercise Prescription (Final Exercise Prescription Changes): Exercise Prescription Changes - 08/03/17 1400      Response to Exercise   Heart Rate (Exercise)  --       Functional Capacity: 6 Minute Walk    Row Name 04/18/17 1144         6 Minute Walk   Phase  Initial     Distance  1863 feet     Walk Time   6 minutes     # of Rest Breaks  0     MPH  3.53     METS  4.5     RPE  11     VO2 Peak  15.6     Symptoms  Yes (comment)     Comments  heavier breathing at end of walk test     Resting HR  67 bpm     Resting BP  128/60     Max Ex. HR  103 bpm     Max Ex. BP  122/72     2 Minute Post BP  116/66        Psychological, QOL, Others - Outcomes: PHQ 2/9: Depression screen [employer] 2/9 04/28/2017 09/22/2016  Decreased Interest 0 0  Down, Depressed, Hopeless 0 0  PHQ - 2 Score 0 0    Quality of Life: Quality of Life - 05/12/17 1651  Quality of Life Scores   Health/Function Pre  -- pt history of vit B deficiency leads to chronic fatigue. pt has learned how to cope. pt also sees therapist for depression which is currenlty stable.     GLOBAL Pre  -- overall pt concerns are depression related, pt states symptoms are presently well managed and verablizes when to contact therapist/psychiatry if symptoms change.  pt offered emotional support and reassurance.       Personal Goals: Goals established at orientation with interventions provided to work toward goal. Personal Goals and Risk Factors at Admission - 04/18/17 1150      Core Components/Risk Factors/Patient Goals on Admission   Diabetes  Yes    Intervention  Provide education about signs/symptoms and action to take for hypo/hyperglycemia.;Provide education about proper nutrition, including hydration, and aerobic/resistive exercise prescription along with prescribed medications to achieve blood glucose in normal ranges: Fasting glucose 65-99 mg/dL    Expected Outcomes  Short Term: Participant verbalizes understanding of the signs/symptoms and immediate care of hyper/hypoglycemia, proper foot care and importance of medication, aerobic/resistive exercise and nutrition plan for blood glucose control.;Long Term: Attainment of HbA1C < 7%.    Hypertension  Yes    Intervention  Provide education on lifestyle modifcations including regular physical  activity/exercise, weight management, moderate sodium restriction and increased consumption of fresh fruit, vegetables, and low fat dairy, alcohol moderation, and smoking cessation.;Monitor prescription use compliance.    Expected Outcomes  Short Term: Continued assessment and intervention until BP is < 140/23mm HG in hypertensive participants. < 130/79mm HG in hypertensive participants with diabetes, heart failure or chronic kidney disease.;Long Term: Maintenance of blood pressure at goal levels.    Lipids  Yes    Intervention  Provide education and support for participant on nutrition & aerobic/resistive exercise along with prescribed medications to achieve LDL 70mg , HDL >40mg .    Expected Outcomes  Short Term: Participant states understanding of desired cholesterol values and is compliant with medications prescribed. Participant is following exercise prescription and nutrition guidelines.;Long Term: Cholesterol controlled with medications as prescribed, with individualized exercise RX and with personalized nutrition plan. Value goals: LDL < 70mg , HDL > 40 mg.        Personal Goals Discharge: Goals and Risk Factor Review    Row Name 05/02/17 0728 05/31/17 1109 06/21/17 1022 07/18/17 0758 09/13/17 1222     Core Components/Risk Factors/Patient Goals Review   Personal Goals Review  Weight Management/Obesity;Hypertension;Lipids;Diabetes  Weight Management/Obesity;Hypertension;Lipids;Diabetes  Weight Management/Obesity;Hypertension;Lipids;Diabetes  Weight Management/Obesity;Hypertension;Lipids;Diabetes  Weight Management/Obesity;Hypertension;Lipids;Diabetes   Review  pt with multiple risk factors demonstrates eagerness to participate in CR activities.  pt with multiple risk factors demonstrates eagerness to participate in CR activities. pt activity progression somewhat limited by back pain.  pt encouraged to continue stretches and core strengthening exercises as prescribed by physical therapy.    pt with  multiple risk factors demonstrates eagerness to participate in CR activities. pt enjoying HIIT participation.    pt with multiple risk factors demonstrates eagerness to participate in CR activities. pt enjoying HIIT participation.  Participation somewhat limited by chronic back pain  pt with multiple risk factors demonstrates eagerness to participate in CR activities. pt enjoying HIIT participation.  Participation somewhat limited by chronic back pain. pt plans to continue exercising on his own.    Expected Outcomes  pt will participate in CR exercise, nutrition and lifestyle modification activities to decrease overall CAD risk factors   pt will participate in CR exercise, nutrition and lifestyle modification activities  to decrease overall CAD risk factors   pt will participate in CR exercise, nutrition and lifestyle modification activities to decrease overall CAD risk factors   pt will participate in CR exercise, nutrition and lifestyle modification activities to decrease overall CAD risk factors   pt will participate in exercise, nutrition and lifestyle modification activities to decrease overall CAD risk factors       Exercise Goals and Review: Exercise Goals    Row Name 04/18/17 0859             Exercise Goals   Increase Physical Activity  Yes       Intervention  Provide advice, education, support and counseling about physical activity/exercise needs.;Develop an individualized exercise prescription for aerobic and resistive training based on initial evaluation findings, risk stratification, comorbidities and participant's personal goals.       Expected Outcomes  Achievement of increased cardiorespiratory fitness and enhanced flexibility, muscular endurance and strength shown through measurements of functional capacity and personal statement of participant.       Increase Strength and Stamina  Yes       Intervention  Provide advice, education, support and counseling about physical  activity/exercise needs.;Develop an individualized exercise prescription for aerobic and resistive training based on initial evaluation findings, risk stratification, comorbidities and participant's personal goals.       Expected Outcomes  Achievement of increased cardiorespiratory fitness and enhanced flexibility, muscular endurance and strength shown through measurements of functional capacity and personal statement of participant.          Nutrition & Weight - Outcomes: Pre Biometrics - 04/18/17 1149      Pre Biometrics   Waist Circumference  43 inches    Hip Circumference  42.5 inches    Waist to Hip Ratio  1.01 %    Triceps Skinfold  25 mm    % Body Fat  32 %    Grip Strength  51 kg    Flexibility  12 in    Single Leg Stand  14.21 seconds        Nutrition: Nutrition Therapy & Goals - 04/18/17 1207      Nutrition Therapy   Diet  Carb Modified, Therapeutic Lifestyle Changes      Personal Nutrition Goals   Nutrition Goal  Pt to identify food quantities necessary to achieve weight loss of 6-24 lb (2.7-10.9 kg) at graduation from cardiac rehab. Goal wt of 170 lb desired.     Personal Goal #2  CBG concentrations in the normal range or as close to normal as is safely possible.      Intervention Plan   Intervention  Prescribe, educate and counsel regarding individualized specific dietary modifications aiming towards targeted core components such as weight, hypertension, lipid management, diabetes, heart failure and other comorbidities.    Expected Outcomes  Short Term Goal: Understand basic principles of dietary content, such as calories, fat, sodium, cholesterol and nutrients.;Long Term Goal: Adherence to prescribed nutrition plan.       Nutrition Discharge: Nutrition Assessments - 04/18/17 1128      MEDFICTS Scores   Pre Score  32       Education Questionnaire Score: Knowledge Questionnaire Score - 04/28/17 1206      Knowledge Questionnaire Score   Pre Score  20/24  cardiac, DM 13/15       Goals reviewed with patient; copy given to patient.

## 2017-10-02 DIAGNOSIS — D510 Vitamin B12 deficiency anemia due to intrinsic factor deficiency: Secondary | ICD-10-CM | POA: Diagnosis not present

## 2017-10-05 DIAGNOSIS — H6983 Other specified disorders of Eustachian tube, bilateral: Secondary | ICD-10-CM | POA: Diagnosis not present

## 2017-10-05 DIAGNOSIS — E785 Hyperlipidemia, unspecified: Secondary | ICD-10-CM | POA: Diagnosis not present

## 2017-10-05 DIAGNOSIS — E1142 Type 2 diabetes mellitus with diabetic polyneuropathy: Secondary | ICD-10-CM | POA: Diagnosis not present

## 2017-10-16 DIAGNOSIS — H6983 Other specified disorders of Eustachian tube, bilateral: Secondary | ICD-10-CM | POA: Diagnosis not present

## 2017-10-16 DIAGNOSIS — H6993 Unspecified Eustachian tube disorder, bilateral: Secondary | ICD-10-CM | POA: Diagnosis not present

## 2017-10-17 ENCOUNTER — Other Ambulatory Visit: Payer: Self-pay | Admitting: Interventional Cardiology

## 2017-10-23 DIAGNOSIS — D510 Vitamin B12 deficiency anemia due to intrinsic factor deficiency: Secondary | ICD-10-CM | POA: Diagnosis not present

## 2017-11-22 ENCOUNTER — Other Ambulatory Visit: Payer: Self-pay | Admitting: Interventional Cardiology

## 2017-11-23 DIAGNOSIS — D510 Vitamin B12 deficiency anemia due to intrinsic factor deficiency: Secondary | ICD-10-CM | POA: Diagnosis not present

## 2018-01-16 DIAGNOSIS — M542 Cervicalgia: Secondary | ICD-10-CM | POA: Diagnosis not present

## 2018-01-16 DIAGNOSIS — M25512 Pain in left shoulder: Secondary | ICD-10-CM | POA: Diagnosis not present

## 2018-02-06 DIAGNOSIS — E119 Type 2 diabetes mellitus without complications: Secondary | ICD-10-CM | POA: Diagnosis not present

## 2018-03-08 DIAGNOSIS — R05 Cough: Secondary | ICD-10-CM | POA: Diagnosis not present

## 2018-03-08 DIAGNOSIS — J028 Acute pharyngitis due to other specified organisms: Secondary | ICD-10-CM | POA: Diagnosis not present

## 2018-03-08 DIAGNOSIS — D510 Vitamin B12 deficiency anemia due to intrinsic factor deficiency: Secondary | ICD-10-CM | POA: Diagnosis not present

## 2018-04-02 DIAGNOSIS — I10 Essential (primary) hypertension: Secondary | ICD-10-CM | POA: Diagnosis not present

## 2018-04-02 DIAGNOSIS — E1142 Type 2 diabetes mellitus with diabetic polyneuropathy: Secondary | ICD-10-CM | POA: Diagnosis not present

## 2018-04-02 DIAGNOSIS — E785 Hyperlipidemia, unspecified: Secondary | ICD-10-CM | POA: Diagnosis not present

## 2018-04-09 ENCOUNTER — Other Ambulatory Visit: Payer: Self-pay | Admitting: Interventional Cardiology

## 2018-04-09 MED ORDER — LISINOPRIL 10 MG PO TABS: 5.0000 mg | ORAL_TABLET | Freq: Every day | ORAL | 3 refills | Status: DC

## 2018-04-09 MED ORDER — NITROGLYCERIN 0.4 MG SL SUBL: 0.4000 mg | SUBLINGUAL_TABLET | SUBLINGUAL | 2 refills | Status: DC | PRN

## 2018-04-09 MED ORDER — ATORVASTATIN CALCIUM 80 MG PO TABS: ORAL_TABLET | ORAL | 3 refills | Status: DC

## 2018-04-09 MED ORDER — NEBIVOLOL HCL 5 MG PO TABS: 5.0000 mg | ORAL_TABLET | Freq: Every day | ORAL | 0 refills | Status: DC

## 2018-04-09 NOTE — Telephone Encounter (Signed)
Pt's medication was sent to pt's pharmacy as requested. Confirmation received.  °

## 2018-04-09 NOTE — Telephone Encounter (Signed)
New Message     *STAT* If patient is at the pharmacy, call can be transferred to refill team.   1. Which medications need to be refilled? (please list name of each medication and dose if known) BYSTOLIC 5 MG tablet, atorvastatin (LIPITOR) 80 MG tablet, fenofibrate 160 MG tablet, prasugrel (EFFIENT) 10 MG TABS tablet, nitroGLYCERIN (NITROSTAT) 0.4 MG SL tablet  And lisinopril (PRINIVIL,ZESTRIL) 10 MG tablet    2. Which pharmacy/location (including street and city if local pharmacy) is medication to be sent to? [facility] - [place], [place] - [phone]   3. Do they need a 30 day or 90 day supply? 90 day

## 2018-04-20 DIAGNOSIS — D510 Vitamin B12 deficiency anemia due to intrinsic factor deficiency: Secondary | ICD-10-CM | POA: Diagnosis not present

## 2018-05-22 ENCOUNTER — Other Ambulatory Visit: Payer: Self-pay | Admitting: Interventional Cardiology

## 2018-05-29 DIAGNOSIS — D510 Vitamin B12 deficiency anemia due to intrinsic factor deficiency: Secondary | ICD-10-CM | POA: Diagnosis not present

## 2018-06-19 DIAGNOSIS — D510 Vitamin B12 deficiency anemia due to intrinsic factor deficiency: Secondary | ICD-10-CM | POA: Diagnosis not present

## 2018-07-02 ENCOUNTER — Other Ambulatory Visit: Payer: Self-pay | Admitting: Interventional Cardiology

## 2018-07-02 MED ORDER — PRASUGREL HCL 10 MG PO TABS: 10.0000 mg | ORAL_TABLET | Freq: Every day | ORAL | 0 refills | Status: DC

## 2018-07-11 NOTE — Progress Notes (Signed)
Cardiology Office Note   Date:  07/12/2018   ID:  Troy Martinez, DOB 1961-04-23, MRN [id number]  PCP:  Street, Sharon Mt, MD    No chief complaint on file.  CAD  Wt Readings from Last 3 Encounters:  07/12/18 199 lb 12.8 oz (90.6 kg)  06/29/17 202 lb 3.2 oz (91.7 kg)  04/18/17 205 lb 7.5 oz (93.2 kg)       History of Present Illness: Troy Martinez is a 57 y.o. male  with history of CAD (s/p prior DES to LCX 2008, DES to RCA 2012, DESx2 to ramus 03/2014, recent PTCA to ramus 2 03/2017), GERD, HTN, HLD, obesity, arthritis, DM2.  Cath in 7/18 showed: 1. Patent RCA stent. 2. Patent circuflex stent. 3. Mid LAD lesion, 25 %stenosed. 4. Ramus-2 lesion, 70 %stenosed, focal area of instent restenosis. The was treated with a 3.0 Wolverine cutting balloon, inflated to 3.25 mm. 5. Post intervention, there is a 0% residual stenosis. 6. The left ventricular systolic function is normal. 7. LV end diastolic pressure is normal. 8. The left ventricular ejection fraction is 55-65% by visual estimate. 9. There is no aortic valve stenosis. Dist Cx lesion, 75 %stenosed. Unchanged from prior in small vessel.  Denies : Chest pain. Dizziness. Leg edema. Nitroglycerin use. Orthopnea. Palpitations. Paroxysmal nocturnal dyspnea. Shortness of breath. Syncope.   He responds well to his knee injections.  He gets one every few years.  He thinks he is due now.  This has limited his physical activity mildly.  He is confident that once he gets his injection, he will be back to more activity.  Past Medical History:  Diagnosis Date  . Arthritis   . Coronary artery disease    a. DES to LCx (2008)  b. DES to RCA (2012) c. Overlapping DESx2 to large ramus (03/2014). d. [place] 03/2017 s/p cutting ballon angioplasty to ramus 2.  . GERD (gastroesophageal reflux disease)   . High cholesterol   . Hypertension   . Obesity   . Type II diabetes mellitus ([place])     Past Surgical History:  Procedure  Laterality Date  . ANTERIOR CERVICAL DECOMP/DISCECTOMY FUSION  05/2005  . BACK SURGERY    . CARDIAC CATHETERIZATION  06/2007   . CORONARY ANGIOPLASTY WITH STENT PLACEMENT  06/2007 11/2010   DES in distal circumflex (Endeavor) w normal LV function 11/2010 DES to RCA-Promus  . CORONARY ANGIOPLASTY WITH STENT PLACEMENT  04/04/2014   "2; got a total of 4 now"  . CORONARY BALLOON ANGIOPLASTY Left 04/06/2017   Procedure: Coronary Balloon Angioplasty;  Surgeon: Jettie Booze, MD;  Location: [facility];  Service: Cardiovascular;  Laterality: Left;  Cutting balloon to the Mid  Ramus  . DECOMPRESSION FASCIOTOMY LEG Right 2003  . LEFT HEART CATH AND CORONARY ANGIOGRAPHY N/A 04/06/2017   Procedure: Left Heart Cath and Coronary Angiography;  Surgeon: Jettie Booze, MD;  Location: [facility];  Service: Cardiovascular;  Laterality: N/A;  . LEFT HEART CATHETERIZATION WITH CORONARY ANGIOGRAM N/A 04/04/2014   Procedure: LEFT HEART CATHETERIZATION WITH CORONARY ANGIOGRAM;  Surgeon: Jettie Booze, MD;  Location: [facility] CATH LAB;  Service: Cardiovascular;  Laterality: N/A;  . LEG SURGERY Right    "cut scar tissue out"  . ORIF PROXIMAL TIBIAL PLATEAU FRACTURE Right 2003  . PERCUTANEOUS STENT INTERVENTION  04/04/2014   Procedure: PERCUTANEOUS STENT INTERVENTION;  Surgeon: Jettie Booze, MD;  Location: [facility] CATH LAB;  Service: Cardiovascular;;  DES x  2 Ramus 2.5x8, 2.5x32   . TIBIA HARDWARE REMOVAL Right ?2004     Current Outpatient Medications  Medication Sig Dispense Refill  . acetaminophen (TYLENOL) 500 MG tablet Take 2 tablets (1,000 mg total) by mouth daily as needed for moderate pain or headache.    . Ascorbic Acid (VITAMIN C) 500 MG CHEW Chew 250 mg by mouth daily.    Marland Kitchen atorvastatin (LIPITOR) 80 MG tablet Take 1 tablet (80 mg total) by mouth daily. Please keep upcoming appt in October for future refills. Thank you 30 tablet 1  . buPROPion (WELLBUTRIN SR) 150 MG 12 hr tablet  Take 150 mg by mouth 2 (two) times daily.  1  . Cyanocobalamin (B-12 COMPLIANCE INJECTION) 1000 MCG/ML KIT Inject as directed every 30 (thirty) days.     Marland Kitchen escitalopram (LEXAPRO) 20 MG tablet Take 1 tablet by mouth daily.    Marland Kitchen esomeprazole (NEXIUM) 20 MG capsule Take 20 mg by mouth daily at 12 noon.    Marland Kitchen Fe-Succ Ac-C-Thre Ac-B12-FA (FERREX 150 FORTE PLUS) 50-100 MG CAPS Take 1 capsule by mouth every evening.     . fenofibrate 160 MG tablet TAKE 1 TABLET BY MOUTH EVERY DAY 90 tablet 3  . glimepiride (AMARYL) 1 MG tablet Take 0.5 mg by mouth daily with breakfast.     . JANUMET XR 50-1000 MG TB24 Take 1 tablet by mouth 2 (two) times daily.   3  . lisinopril (PRINIVIL,ZESTRIL) 10 MG tablet Take 0.5 tablets (5 mg total) by mouth daily. Please keep upcoming appt in October for future refills. Thank you 30 tablet 3  . Multiple Vitamins-Minerals (MULTIVITAMIN PO) Take 1 tablet by mouth daily.    . nebivolol (BYSTOLIC) 5 MG tablet Take 1 tablet (5 mg total) by mouth daily. Please keep upcoming appt in October for future refills. Thank you 90 tablet 0  . nitroGLYCERIN (NITROSTAT) 0.4 MG SL tablet Place 1 tablet (0.4 mg total) under the tongue every 5 (five) minutes as needed for chest pain. Please keep upcoming appt. Thank you 25 tablet 2  . ONE TOUCH ULTRA TEST test strip 1 each.     . prasugrel (EFFIENT) 10 MG TABS tablet Take 1 tablet (10 mg total) by mouth daily. Please keep upcoming appt with Dr. Irish Lack in October for future refills. Thank you 90 tablet 0   No current facility-administered medications for this visit.     Allergies:   Patient has no known allergies.    Social History:  The patient  reports that he quit smoking about 19 years ago. His smoking use included cigarettes. He has a 69.00 pack-year smoking history. He has never used smokeless tobacco. He reports that he drinks about 14.0 standard drinks of alcohol per week. He reports that he has current or past drug history. Drug:  Marijuana.   Family History:  The patient's family history includes Heart attack in his father, maternal grandmother, and paternal grandfather; Heart disease in his father; Hypertension in his father; Stroke in his mother.    ROS:  Please see the history of present illness.   Otherwise, review of systems are positive for knee pain.   All other systems are reviewed and negative.    PHYSICAL EXAM: VS:  BP 112/68   Pulse (!) 57   Ht _0  (1.727 m)   Wt 199 lb 12.8 oz (90.6 kg)   SpO2 94%   BMI 30.38 kg/m  , BMI Body mass index is 30.38 kg/m. GEN: Well nourished,  well developed, in no acute distress  HEENT: normal  Neck: no JVD, carotid bruits, or masses Cardiac: RRR; no murmurs, rubs, or gallops,no edema  Respiratory:  clear to auscultation bilaterally, normal work of breathing GI: soft, nontender, nondistended, + BS MS: no deformity or atrophy  Skin: warm and dry, no rash Neuro:  Strength and sensation are intact Psych: euthymic mood, full affect   EKG:   The ekg ordered today demonstrates normal ECG, no ST changes   Recent Labs: No results found for requested labs within last 8760 hours.   Lipid Panel    Component Value Date/Time   CHOL 123 12/01/2016 0902   TRIG 192 (H) 12/01/2016 0902   HDL 24 (L) 12/01/2016 0902   CHOLHDL 5.1 (H) 12/01/2016 0902   LDLCALC 61 12/01/2016 0902     Other studies Reviewed: Additional studies/ records that were reviewed today with results demonstrating: LDL 74 in 7/19, A1C 6.9..   ASSESSMENT AND PLAN:  1. CAD: No angina on medical therapy.  Continue aggressive secondary prevention.  Continue healthy lifestyle. 2. Hyperlipidemia:LDL 74 in 2019.  COntinue lipid lowering therapy.   3. HTN: The current medical regimen is effective;  continue present plan and medications.    Current medicines are reviewed at length with the patient today.  The patient concerns regarding his medicines were addressed.  The following changes have been  made:  No change  Labs/ tests ordered today include:  No orders of the defined types were placed in this encounter.   Recommend 150 minutes/week of aerobic exercise Low fat, low carb, high fiber diet recommended  Disposition:   FU in 1 year   Signed, Larae Grooms, MD  07/12/2018 9:02 AM    [facility] [address], [place], [place]  [address] Phone: [phone]; Fax: [phone]

## 2018-07-12 ENCOUNTER — Ambulatory Visit: Payer: 59 | Admitting: Interventional Cardiology

## 2018-07-12 ENCOUNTER — Encounter: Payer: Self-pay | Admitting: Interventional Cardiology

## 2018-07-12 VITALS — BP 112/68 | HR 57 | Ht 68.0 in | Wt 199.8 lb

## 2018-07-12 DIAGNOSIS — E782 Mixed hyperlipidemia: Secondary | ICD-10-CM | POA: Diagnosis not present

## 2018-07-12 DIAGNOSIS — I10 Essential (primary) hypertension: Secondary | ICD-10-CM | POA: Diagnosis not present

## 2018-07-12 DIAGNOSIS — I25119 Atherosclerotic heart disease of native coronary artery with unspecified angina pectoris: Secondary | ICD-10-CM

## 2018-07-12 MED ORDER — FENOFIBRATE 160 MG PO TABS: 160.0000 mg | ORAL_TABLET | Freq: Every day | ORAL | 3 refills | Status: DC

## 2018-07-12 MED ORDER — PRASUGREL HCL 10 MG PO TABS: 10.0000 mg | ORAL_TABLET | Freq: Every day | ORAL | 3 refills | Status: DC

## 2018-07-12 MED ORDER — LISINOPRIL 10 MG PO TABS: 5.0000 mg | ORAL_TABLET | Freq: Every day | ORAL | 3 refills | Status: DC

## 2018-07-12 MED ORDER — ATORVASTATIN CALCIUM 80 MG PO TABS: 80.0000 mg | ORAL_TABLET | Freq: Every day | ORAL | 3 refills | Status: DC

## 2018-07-12 MED ORDER — NITROGLYCERIN 0.4 MG SL SUBL: 0.4000 mg | SUBLINGUAL_TABLET | SUBLINGUAL | 3 refills | Status: DC | PRN

## 2018-07-12 MED ORDER — NEBIVOLOL HCL 5 MG PO TABS: 5.0000 mg | ORAL_TABLET | Freq: Every day | ORAL | 3 refills | Status: DC

## 2018-07-12 NOTE — Patient Instructions (Signed)

## 2018-07-20 DIAGNOSIS — D510 Vitamin B12 deficiency anemia due to intrinsic factor deficiency: Secondary | ICD-10-CM | POA: Diagnosis not present

## 2018-07-30 DIAGNOSIS — M25561 Pain in right knee: Secondary | ICD-10-CM | POA: Diagnosis not present

## 2018-07-30 DIAGNOSIS — M1711 Unilateral primary osteoarthritis, right knee: Secondary | ICD-10-CM | POA: Diagnosis not present

## 2018-08-06 DIAGNOSIS — M1711 Unilateral primary osteoarthritis, right knee: Secondary | ICD-10-CM | POA: Diagnosis not present

## 2018-08-13 DIAGNOSIS — M1711 Unilateral primary osteoarthritis, right knee: Secondary | ICD-10-CM | POA: Diagnosis not present

## 2018-08-27 DIAGNOSIS — D510 Vitamin B12 deficiency anemia due to intrinsic factor deficiency: Secondary | ICD-10-CM | POA: Diagnosis not present

## 2018-09-20 DIAGNOSIS — R05 Cough: Secondary | ICD-10-CM | POA: Diagnosis not present

## 2018-09-20 DIAGNOSIS — Z2821 Immunization not carried out because of patient refusal: Secondary | ICD-10-CM | POA: Diagnosis not present

## 2018-09-20 DIAGNOSIS — E538 Deficiency of other specified B group vitamins: Secondary | ICD-10-CM | POA: Diagnosis not present

## 2018-09-26 DIAGNOSIS — M25511 Pain in right shoulder: Secondary | ICD-10-CM | POA: Diagnosis not present

## 2018-09-26 DIAGNOSIS — M7541 Impingement syndrome of right shoulder: Secondary | ICD-10-CM | POA: Diagnosis not present

## 2018-10-25 DIAGNOSIS — Z125 Encounter for screening for malignant neoplasm of prostate: Secondary | ICD-10-CM | POA: Diagnosis not present

## 2018-10-25 DIAGNOSIS — E1142 Type 2 diabetes mellitus with diabetic polyneuropathy: Secondary | ICD-10-CM | POA: Diagnosis not present

## 2018-10-25 DIAGNOSIS — Z0000 Encounter for general adult medical examination without abnormal findings: Secondary | ICD-10-CM | POA: Diagnosis not present

## 2018-11-06 DIAGNOSIS — E538 Deficiency of other specified B group vitamins: Secondary | ICD-10-CM | POA: Diagnosis not present

## 2018-11-12 DIAGNOSIS — K219 Gastro-esophageal reflux disease without esophagitis: Secondary | ICD-10-CM | POA: Diagnosis not present

## 2018-11-12 DIAGNOSIS — I2510 Atherosclerotic heart disease of native coronary artery without angina pectoris: Secondary | ICD-10-CM | POA: Diagnosis not present

## 2018-11-13 DIAGNOSIS — M47896 Other spondylosis, lumbar region: Secondary | ICD-10-CM | POA: Diagnosis not present

## 2018-12-03 DIAGNOSIS — M545 Low back pain: Secondary | ICD-10-CM | POA: Diagnosis not present

## 2018-12-10 DIAGNOSIS — M545 Low back pain: Secondary | ICD-10-CM | POA: Diagnosis not present

## 2018-12-14 DIAGNOSIS — M545 Low back pain: Secondary | ICD-10-CM | POA: Diagnosis not present

## 2018-12-20 DIAGNOSIS — M7541 Impingement syndrome of right shoulder: Secondary | ICD-10-CM | POA: Diagnosis not present

## 2018-12-20 DIAGNOSIS — M13811 Other specified arthritis, right shoulder: Secondary | ICD-10-CM | POA: Diagnosis not present

## 2018-12-20 DIAGNOSIS — M25511 Pain in right shoulder: Secondary | ICD-10-CM | POA: Diagnosis not present

## 2019-01-17 DIAGNOSIS — M13811 Other specified arthritis, right shoulder: Secondary | ICD-10-CM | POA: Diagnosis not present

## 2019-01-17 DIAGNOSIS — M7541 Impingement syndrome of right shoulder: Secondary | ICD-10-CM | POA: Diagnosis not present

## 2019-01-17 DIAGNOSIS — K2980 Duodenitis without bleeding: Secondary | ICD-10-CM | POA: Diagnosis not present

## 2019-01-17 DIAGNOSIS — K219 Gastro-esophageal reflux disease without esophagitis: Secondary | ICD-10-CM | POA: Diagnosis not present

## 2019-01-17 DIAGNOSIS — E538 Deficiency of other specified B group vitamins: Secondary | ICD-10-CM | POA: Diagnosis not present

## 2019-01-17 DIAGNOSIS — B9681 Helicobacter pylori [H. pylori] as the cause of diseases classified elsewhere: Secondary | ICD-10-CM | POA: Diagnosis not present

## 2019-01-22 DIAGNOSIS — M533 Sacrococcygeal disorders, not elsewhere classified: Secondary | ICD-10-CM | POA: Diagnosis not present

## 2019-01-22 DIAGNOSIS — Z6830 Body mass index (BMI) 30.0-30.9, adult: Secondary | ICD-10-CM | POA: Diagnosis not present

## 2019-01-22 DIAGNOSIS — I10 Essential (primary) hypertension: Secondary | ICD-10-CM | POA: Diagnosis not present

## 2019-02-12 DIAGNOSIS — M25511 Pain in right shoulder: Secondary | ICD-10-CM | POA: Diagnosis not present

## 2019-02-13 DIAGNOSIS — W57XXXA Bitten or stung by nonvenomous insect and other nonvenomous arthropods, initial encounter: Secondary | ICD-10-CM | POA: Diagnosis not present

## 2019-02-13 DIAGNOSIS — S70369A Insect bite (nonvenomous), unspecified thigh, initial encounter: Secondary | ICD-10-CM | POA: Diagnosis not present

## 2019-02-13 DIAGNOSIS — Z6830 Body mass index (BMI) 30.0-30.9, adult: Secondary | ICD-10-CM | POA: Diagnosis not present

## 2019-07-31 ENCOUNTER — Telehealth: Payer: Self-pay

## 2019-07-31 NOTE — Telephone Encounter (Signed)
NOT NEEDED [facility] WILL REFAX

## 2019-08-05 ENCOUNTER — Other Ambulatory Visit: Payer: Self-pay | Admitting: Orthopedic Surgery

## 2019-08-05 ENCOUNTER — Telehealth: Payer: Self-pay | Admitting: Nurse Practitioner

## 2019-08-05 ENCOUNTER — Telehealth: Payer: Self-pay | Admitting: *Deleted

## 2019-08-05 DIAGNOSIS — G8929 Other chronic pain: Secondary | ICD-10-CM

## 2019-08-05 DIAGNOSIS — M545 Low back pain, unspecified: Secondary | ICD-10-CM

## 2019-08-05 NOTE — Telephone Encounter (Signed)
   [facility] Pre-operative Risk Assessment    Request for surgical clearance:  1. What type of surgery is being performed? MYELOGRAM   2. When is this surgery scheduled? TBD   3. What type of clearance is required (medical clearance vs. Pharmacy clearance to hold med vs. Both)? MEDICAL  4. Are there any medications that need to be held prior to surgery and how long? EFFIENT 7 DAYS PRIOR    5. Practice name and name of physician performing surgery? [facility]; DOCTOR IS NOT LISTED    6. What is your office phone number [phone]    7.   What is your office fax number [phone]  8.   Anesthesia type (None, local, MAC, general) ? LOCAL   Julaine Hua 08/05/2019, 3:00 PM  _________________________________________________________________   (provider comments below)

## 2019-08-05 NOTE — Telephone Encounter (Signed)
   Hessmer, MD  Chart reviewed as part of pre-operative protocol coverage. Patient has not been seen by our service since 06/2019 and is overdue for annual visit; therefore,, he will require a follow-up visit in order to better assess preoperative cardiovascular risk.  Pre-op covering staff: - Please schedule appointment and call patient to inform them. - Please contact requesting surgeon's office via preferred method (i.e, phone, fax) to inform them of need for appointment prior to surgery.  I will also route note to Dr. Irish Lack for input on holding Effient prior to procedure so that information is available at time of patient's appointment. Dr. Irish Lack, please send response back to P CV DIV PREOP POOL.   Thank you!  Darreld Mclean, PA-C  08/05/2019, 3:09 PM

## 2019-08-05 NOTE — Telephone Encounter (Signed)
OK to hold Effient 5 days prior to procedure.

## 2019-08-05 NOTE — Telephone Encounter (Signed)
Phone call to patient to verify medication list and allergies for myelogram procedure. Pt instructed to hold wellbutrin and lexapro for 48hrs prior to myelogram appointment time. Pt also aware he will need to hold his effient for this procedure, pending approval and hold time recommended by his cardiologist Dr. Irish Lack. Pt verbalized understanding. Pre and post procedure instructions reviewed with pt. [place] hold request faxed to Dr. Irish Lack, awaiting response.

## 2019-08-05 NOTE — Telephone Encounter (Signed)
Pt has been scheduled to see Pecolia Ades, NP 08/23/19 for surgery clearance. I will send clearance notes to NP for appt and to [place] as to update. I will remove from the pre op call back pool.

## 2019-08-06 NOTE — Telephone Encounter (Signed)
Follow up   Please return call to [facility] [place] to clarify instructions from Dr Irish Lack.  Please call [phone]

## 2019-08-06 NOTE — Telephone Encounter (Signed)
Spoke with Troy Martinez at [facility] at [phone].  Clarified that Troy Martinez will need to be seen in the office before he is officially cleared.  Dr. Irish Lack did leave his recommendations to hold Eliquis, but was not a official clearance to proceed with myelogram.  Troy Martinez verbalized understanding.

## 2019-08-12 NOTE — Progress Notes (Deleted)
Cardiology Office Note:    Date:  08/12/2019   ID:  Troy Martinez, DOB 1960/11/04, MRN [id number]  PCP:  Street, Sharon Mt, MD  Cardiologist:  Larae Grooms, MD  Referring MD: [address], Sharon Mt, *   No chief complaint on file. ***  History of Present Illness:    Troy Martinez is a 58 y.o. male with a past medical history significant for CAD (s/p prior DES to LCx 2008, DES to RCA 2012, DES x 2 to ramus 2015, PTCA to ramus 2 2018), GERD, HEN, HLD, obesity, arthritis, Dm type 2.  His CAD is managed with medical interventions and lifestyle changes.  He is on a high intensity statin, beta-blocker, ACE inhibitor and Effient.  The patient was last seen in the office on 07/12/2018 by Dr. Irish Lack at which time he was doing well from a cardiac standpoint.  He was having issues with his knees.  The patient is being seen today for clearance for myelogram and also he is due for his annual follow-up.     ?Labs, [facility]   Cardiac studies   Coronary Balloon Angioplasty 04/06/2017    Conclusion   Patent RCA stent.  Patent circuflex stent.  Mid LAD lesion, 25 %stenosed.  Ramus-2 lesion, 70 %stenosed, focal area of instent restenosis. The was treated with a 3.0 Wolverine cutting balloon, inflated to 3.25 mm.  Post intervention, there is a 0% residual stenosis.  The left ventricular systolic function is normal.  LV end diastolic pressure is normal.  The left ventricular ejection fraction is 55-65% by visual estimate.  There is no aortic valve stenosis.  Dist Cx lesion, 75 %stenosed. Unchanged from prior in small vessel.   Plan for same day discharge.  Continue Effient.  Resume aspirin 81 mg daily for at least a month.         Past Medical History:  Diagnosis Date  . Arthritis   . Coronary artery disease    a. DES to LCx (2008)  b. DES to RCA (2012) c. Overlapping DESx2 to large ramus (03/2014). d. [place] 03/2017 s/p cutting ballon angioplasty to ramus 2.  . GERD  (gastroesophageal reflux disease)   . High cholesterol   . Hypertension   . Obesity   . Type II diabetes mellitus ([place])     Past Surgical History:  Procedure Laterality Date  . ANTERIOR CERVICAL DECOMP/DISCECTOMY FUSION  05/2005  . BACK SURGERY    . CARDIAC CATHETERIZATION  06/2007   . CORONARY ANGIOPLASTY WITH STENT PLACEMENT  06/2007 11/2010   DES in distal circumflex ([facility]avor) w normal LV function 11/2010 DES to RCA-Promus  . CORONARY ANGIOPLASTY WITH STENT PLACEMENT  04/04/2014   "2; got a total of 4 now"  . CORONARY BALLOON ANGIOPLASTY Left 04/06/2017   Procedure: Coronary Balloon Angioplasty;  Surgeon: Jettie Booze, MD;  Location: [facility];  Service: Cardiovascular;  Laterality: Left;  Cutting balloon to the Mid  Ramus  . DECOMPRESSION FASCIOTOMY LEG Right 2003  . LEFT HEART CATH AND CORONARY ANGIOGRAPHY N/A 04/06/2017   Procedure: Left Heart Cath and Coronary Angiography;  Surgeon: Jettie Booze, MD;  Location: [facility];  Service: Cardiovascular;  Laterality: N/A;  . LEFT HEART CATHETERIZATION WITH CORONARY ANGIOGRAM N/A 04/04/2014   Procedure: LEFT HEART CATHETERIZATION WITH CORONARY ANGIOGRAM;  Surgeon: Jettie Booze, MD;  Location: [facility] CATH LAB;  Service: Cardiovascular;  Laterality: N/A;  . LEG SURGERY Right    "cut scar tissue out"  .  ORIF PROXIMAL TIBIAL PLATEAU FRACTURE Right 2003  . PERCUTANEOUS STENT INTERVENTION  04/04/2014   Procedure: PERCUTANEOUS STENT INTERVENTION;  Surgeon: Corky Crafts, MD;  Location: [facility] CATH LAB;  Service: Cardiovascular;;  DES x 2 Ramus 2.5x8, 2.5x32   . TIBIA HARDWARE REMOVAL Right ?2004    Current Medications: No outpatient medications have been marked as taking for the 08/23/19 encounter (Appointment) with Berton Bon, NP.     Allergies:   Patient has no known allergies.   Social History   Socioeconomic History  . Marital status: Married    Spouse name: Not on file  . Number of children:  Not on file  . Years of education: Not on file  . Highest education level: Not on file  Occupational History  . Not on file  Social Needs  . Financial resource strain: Not on file  . Food insecurity    Worry: Not on file    Inability: Not on file  . Transportation needs    Medical: Not on file    Non-medical: Not on file  Tobacco Use  . Smoking status: Former Smoker    Packs/day: 3.00    Years: 23.00    Pack years: 69.00    Types: Cigarettes    Quit date: 01/18/1999    Years since quitting: 20.5  . Smokeless tobacco: Never Used  Substance and Sexual Activity  . Alcohol use: Yes    Alcohol/week: 14.0 standard drinks    Types: 14 Shots of liquor per week    Comment: 04/04/2014 "2 shots/day on average"  . Drug use: Yes    Types: Marijuana    Comment: "quit marijuana in 1994"  . Sexual activity: Yes  Lifestyle  . Physical activity    Days per week: Not on file    Minutes per session: Not on file  . Stress: Not on file  Relationships  . Social Musician on phone: Not on file    Gets together: Not on file    Attends religious service: Not on file    Active member of club or organization: Not on file    Attends meetings of clubs or organizations: Not on file    Relationship status: Not on file  Other Topics Concern  . Not on file  Social History Narrative  . Not on file     Family History: The patient's ***family history includes Heart attack in his father, maternal grandmother, and paternal grandfather; Heart disease in his father; Hypertension in his father; Stroke in his mother. ROS:   Please see the history of present illness.    *** All other systems reviewed and are negative.   EKG:  EKG is *** ordered today.  The ekg ordered today demonstrates ***  Recent Labs: No results found for requested labs within last 8760 hours.   Recent Lipid Panel    Component Value Date/Time   CHOL 123 12/01/2016 0902   TRIG 192 (H) 12/01/2016 0902   HDL 24 (L)  12/01/2016 0902   CHOLHDL 5.1 (H) 12/01/2016 0902   LDLCALC 61 12/01/2016 0902    Physical Exam:    VS:  There were no vitals taken for this visit.    Wt Readings from Last 6 Encounters:  07/12/18 199 lb 12.8 oz (90.6 kg)  06/29/17 202 lb 3.2 oz (91.7 kg)  04/18/17 205 lb 7.5 oz (93.2 kg)  04/13/17 204 lb (92.5 kg)  04/06/17 206 lb (93.4 kg)  04/05/17 206 lb  12.8 oz (93.8 kg)     Physical Exam***   ASSESSMENT:    No diagnosis found. PLAN:    In order of problems listed above: Clearance for myelogram    CAD -Patient with history of multiple coronary interventions, recently in 03/2017. -The patient continues on Effient, beta-blocker, ACE inhibitor and high intensity statin. -  Hyperlipidemia, LDL goal<70 -Patient continues on high intensity statin with atorvastatin 80 mg daily and fenofibrate 160 mg daily -LDL was 61 in 11/2016. ?followed by PCP, update  Hypertension -Managed on lisinopril 10 mg daily, Bystolic 5 mg daily -We will update labs for renal function and potassium  Diabetes type 2 -Managed per primary care provider, on Janumet and glimepiride  Diabetes type 2   Medication Adjustments/Labs and Tests Ordered: Current medicines are reviewed at length with the patient today.  Concerns regarding medicines are outlined above. Labs and tests ordered and medication changes are outlined in the patient instructions below:  There are no Patient Instructions on file for this visit.   Signed, Berton BonJanine Glennice Marcos, NP  08/12/2019 2:32 PM    [facility]

## 2019-08-16 ENCOUNTER — Encounter (HOSPITAL_COMMUNITY): Payer: Self-pay

## 2019-08-16 ENCOUNTER — Emergency Department (HOSPITAL_COMMUNITY)
Admission: EM | Admit: 2019-08-16 | Discharge: 2019-08-16 | Disposition: A | Discharge status: Home / Self Care | Payer: 59 | Attending: Emergency Medicine | Admitting: Emergency Medicine

## 2019-08-16 ENCOUNTER — Other Ambulatory Visit: Payer: Self-pay

## 2019-08-16 ENCOUNTER — Emergency Department (HOSPITAL_COMMUNITY): Payer: 59

## 2019-08-16 DIAGNOSIS — M79604 Pain in right leg: Secondary | ICD-10-CM | POA: Diagnosis present

## 2019-08-16 DIAGNOSIS — I10 Essential (primary) hypertension: Secondary | ICD-10-CM | POA: Diagnosis not present

## 2019-08-16 DIAGNOSIS — M5431 Sciatica, right side: Secondary | ICD-10-CM | POA: Insufficient documentation

## 2019-08-16 DIAGNOSIS — Z79899 Other long term (current) drug therapy: Secondary | ICD-10-CM | POA: Insufficient documentation

## 2019-08-16 DIAGNOSIS — Z87891 Personal history of nicotine dependence: Secondary | ICD-10-CM | POA: Diagnosis not present

## 2019-08-16 DIAGNOSIS — Z7984 Long term (current) use of oral hypoglycemic drugs: Secondary | ICD-10-CM | POA: Diagnosis not present

## 2019-08-16 DIAGNOSIS — I2510 Atherosclerotic heart disease of native coronary artery without angina pectoris: Secondary | ICD-10-CM | POA: Diagnosis not present

## 2019-08-16 DIAGNOSIS — E119 Type 2 diabetes mellitus without complications: Secondary | ICD-10-CM | POA: Insufficient documentation

## 2019-08-16 MED ORDER — PREDNISONE 20 MG PO TABS
60.0000 mg | ORAL_TABLET | Freq: Once | ORAL | Status: AC
  Administered 2019-08-16: 11:00:00 60 mg via ORAL
  Filled 2019-08-16: qty 3

## 2019-08-16 MED ORDER — CYCLOBENZAPRINE HCL 10 MG PO TABS: 10.0000 mg | ORAL_TABLET | Freq: Two times a day (BID) | ORAL | 0 refills | Status: DC | PRN

## 2019-08-16 MED ORDER — MORPHINE SULFATE (PF) 4 MG/ML IV SOLN
4.0000 mg | Freq: Once | INTRAVENOUS | Status: AC
  Administered 2019-08-16: 11:00:00 4 mg via INTRAMUSCULAR
  Filled 2019-08-16: qty 1

## 2019-08-16 MED ORDER — CYCLOBENZAPRINE HCL 10 MG PO TABS
5.0000 mg | ORAL_TABLET | Freq: Once | ORAL | Status: AC
  Administered 2019-08-16: 11:00:00 5 mg via ORAL
  Filled 2019-08-16: qty 1

## 2019-08-16 MED ORDER — OXYCODONE-ACETAMINOPHEN 5-325 MG PO TABS: 2.0000 | ORAL_TABLET | Freq: Four times a day (QID) | ORAL | 0 refills | Status: AC | PRN

## 2019-08-16 MED ORDER — OXYCODONE-ACETAMINOPHEN 5-325 MG PO TABS: 2.0000 | ORAL_TABLET | Freq: Four times a day (QID) | ORAL | 0 refills | Status: DC | PRN

## 2019-08-16 MED ORDER — PREDNISONE 10 MG PO TABS: 40.0000 mg | ORAL_TABLET | Freq: Every day | ORAL | 0 refills | Status: DC

## 2019-08-16 MED ORDER — PREDNISONE 10 MG PO TABS: 40.0000 mg | ORAL_TABLET | Freq: Every day | ORAL | 0 refills | Status: AC

## 2019-08-16 MED ORDER — CYCLOBENZAPRINE HCL 10 MG PO TABS: 10.0000 mg | ORAL_TABLET | Freq: Two times a day (BID) | ORAL | 0 refills | Status: AC | PRN

## 2019-08-16 NOTE — ED Triage Notes (Signed)
Pt presents with c/o right leg pain and back pain. Pt reports that after waking up this morning, he has severe pain in his right leg, starting at the calf muscle and running all the way up his leg to his right lower back. Pt has a hx of bulging discs in his back.

## 2019-08-16 NOTE — Discharge Instructions (Addendum)
Thank you for allowing me to care for you today. Please return to the emergency department if you have new or worsening symptoms. Take your medications as instructed.  ° °

## 2019-08-16 NOTE — ED Provider Notes (Signed)
[facility] DEPT Provider Note   CSN: [id number] Arrival date & time: 08/16/19  1027     History   Chief Complaint Chief Complaint  Patient presents with   Leg Pain    HPI RYLIE KNIERIM is a 58 y.o. male.     Patient is a 58 year old gentleman with past medical history of coronary artery disease, GERD, diabetes, chronic back pain presenting to the emergency department for back pain and right leg pain.  Patient reports that this began this morning upon waking up.  Reports that yesterday he began to paint his house and was standing on a ladder for a prolonged period of time.  Reports that the pain starts in the right lower back and radiates into the right groin medially to the knee.  Also radiates posteriorly stopping foot.  Reports some numbness in the bottom of the foot.  Denies any saddle anesthesia, loss of control of bowel or bladder, fever, chills, leg swelling     Past Medical History:  Diagnosis Date   Arthritis    Coronary artery disease    a. DES to LCx (2008)  b. DES to RCA (2012) c. Overlapping DESx2 to large ramus (03/2014). d. [place] 03/2017 s/p cutting ballon angioplasty to ramus 2.   GERD (gastroesophageal reflux disease)    High cholesterol    Hypertension    Obesity    Type II diabetes mellitus [facility])     Patient Active Problem List   Diagnosis Date Noted   Angina pectoris ([place])    Acute right-sided low back pain with right-sided sciatica 09/22/2016   Right leg pain 09/22/2016   Unstable angina ([place]) 04/05/2014   Coronary artery disease involving native coronary artery of native heart with angina pectoris (HCC)    GERD (gastroesophageal reflux disease)    Type II diabetes mellitus ([place])    Mixed hyperlipidemia 11/27/2013   Essential hypertension, benign 11/27/2013   Obesity 11/27/2013    Past Surgical History:  Procedure Laterality Date   ANTERIOR CERVICAL DECOMP/DISCECTOMY FUSION  05/2005   BACK  SURGERY     CARDIAC CATHETERIZATION  06/2007    CORONARY ANGIOPLASTY WITH STENT PLACEMENT  06/2007 11/2010   DES in distal circumflex (Endeavor) w normal LV function 11/2010 DES to RCA-Promus   CORONARY ANGIOPLASTY WITH STENT PLACEMENT  04/04/2014   "2; got a total of 4 now"   CORONARY BALLOON ANGIOPLASTY Left 04/06/2017   Procedure: Coronary Balloon Angioplasty;  Surgeon: Jettie Booze, MD;  Location: [facility] LAB;  Service: Cardiovascular;  Laterality: Left;  Cutting balloon to the Mid  Ramus   DECOMPRESSION FASCIOTOMY LEG Right 2003   LEFT HEART CATH AND CORONARY ANGIOGRAPHY N/A 04/06/2017   Procedure: Left Heart Cath and Coronary Angiography;  Surgeon: Jettie Booze, MD;  Location: [facility] CV LAB;  Service: Cardiovascular;  Laterality: N/A;   LEFT HEART CATHETERIZATION WITH CORONARY ANGIOGRAM N/A 04/04/2014   Procedure: LEFT HEART CATHETERIZATION WITH CORONARY ANGIOGRAM;  Surgeon: Jettie Booze, MD;  Location: [facility] CATH LAB;  Service: Cardiovascular;  Laterality: N/A;   LEG SURGERY Right    "cut scar tissue out"   ORIF PROXIMAL TIBIAL PLATEAU FRACTURE Right 2003   PERCUTANEOUS STENT INTERVENTION  04/04/2014   Procedure: PERCUTANEOUS STENT INTERVENTION;  Surgeon: Jettie Booze, MD;  Location: [facility] CATH LAB;  Service: Cardiovascular;;  DES x 2 Ramus 2.5x8, 2.5x32    TIBIA HARDWARE REMOVAL Right ?2004        Home  Medications    Prior to Admission medications   Medication Sig Start Date End Date Taking? Authorizing Provider  Ascorbic Acid (VITAMIN C) 500 MG CHEW Chew 250 mg by mouth daily.   Yes [provider]  atorvastatin (LIPITOR) 80 MG tablet Take 1 tablet (80 mg total) by mouth daily. 07/12/18  Yes Jettie Booze, MD  buPROPion (WELLBUTRIN XL) 300 MG 24 hr tablet Take 300 mg by mouth daily. 07/26/19  Yes [provider]  Cyanocobalamin (B-12 COMPLIANCE INJECTION) 1000 MCG/ML KIT Inject as directed every 30 (thirty) days.     Yes [provider]  escitalopram (LEXAPRO) 20 MG tablet Take 20 mg by mouth daily.  10/05/17  Yes [provider]  esomeprazole (NEXIUM) 20 MG capsule Take 20 mg by mouth daily at 12 noon.   Yes [provider]  Fe-Succ Ac-C-Thre Ac-B12-FA (FERREX 150 FORTE PLUS) 50-100 MG CAPS Take 1 capsule by mouth every evening.  04/17/15  Yes [provider]  fenofibrate 160 MG tablet Take 1 tablet (160 mg total) by mouth daily. 07/12/18  Yes Jettie Booze, MD  glimepiride (AMARYL) 1 MG tablet Take 0.5 mg by mouth daily with breakfast.    Yes [provider]  ibuprofen (ADVIL) 200 MG tablet Take 800 mg by mouth every 6 (six) hours as needed for mild pain.   Yes [provider]  JANUMET XR 50-1000 MG TB24 Take 1 tablet by mouth 2 (two) times daily.  04/14/15  Yes [provider]  levocetirizine (XYZAL) 5 MG tablet Take 5 mg by mouth every evening.  07/26/19  Yes [provider]  Multiple Vitamins-Minerals (MULTIVITAMIN PO) Take 1 tablet by mouth daily.   Yes [provider]  nebivolol (BYSTOLIC) 5 MG tablet Take 1 tablet (5 mg total) by mouth daily. 07/12/18  Yes Jettie Booze, MD  nitroGLYCERIN (NITROSTAT) 0.4 MG SL tablet Place 1 tablet (0.4 mg total) under the tongue every 5 (five) minutes as needed for chest pain. 07/12/18  Yes Jettie Booze, MD  prasugrel (EFFIENT) 10 MG TABS tablet Take 1 tablet (10 mg total) by mouth daily. 07/12/18  Yes Jettie Booze, MD  tamsulosin (FLOMAX) 0.4 MG CAPS capsule Take 0.4 mg by mouth daily after supper.    Yes [provider]  acetaminophen (TYLENOL) 500 MG tablet Take 2 tablets (1,000 mg total) by mouth daily as needed for moderate pain or headache. Patient not taking: Reported on 08/16/2019 04/06/17   Charlie Pitter, PA-C  cyclobenzaprine (FLEXERIL) 10 MG tablet Take 1 tablet (10 mg total) by mouth 2 (two) times daily as needed for up to 7 days for muscle  spasms. 08/16/19 08/23/19  Madilyn Hook A, PA-C  lisinopril (PRINIVIL,ZESTRIL) 10 MG tablet Take 0.5 tablets (5 mg total) by mouth daily. Patient not taking: Reported on 08/16/2019 07/12/18   Jettie Booze, MD  ONE TOUCH ULTRA TEST test strip 1 each.  11/02/13   [provider]  oxyCODONE-acetaminophen (PERCOCET/ROXICET) 5-325 MG tablet Take 2 tablets by mouth every 6 (six) hours as needed for up to 3 days for severe pain. 08/16/19 08/19/19  Alveria Apley, PA-C  predniSONE (DELTASONE) 10 MG tablet Take 4 tablets (40 mg total) by mouth daily for 5 days. 08/16/19 08/21/19  Alveria Apley, PA-C    Family History Family History  Problem Relation Age of [place]   Stroke Mother    Heart disease Father    Heart attack Father  Hypertension Father    Heart attack Maternal Grandmother    Heart attack Paternal Grandfather     Social History Social History   Tobacco Use   Smoking status: Former Smoker    Packs/day: 3.00    Years: 23.00    Pack years: 69.00    Types: Cigarettes    Quit date: 01/18/1999    Years since quitting: 20.5   Smokeless tobacco: Never Used  Substance Use Topics   Alcohol use: Yes    Alcohol/week: 14.0 standard drinks    Types: 14 Shots of liquor per week    Comment: 04/04/2014 "2 shots/day on average"   Drug use: Yes    Types: Marijuana    Comment: "quit marijuana in 1994"     Allergies   Patient has no known allergies.   Review of Systems Review of Systems  Constitutional: Negative for chills and fever.  Respiratory: Negative for cough and shortness of breath.   Cardiovascular: Negative for palpitations and leg swelling.  Gastrointestinal: Negative for abdominal pain, nausea and vomiting.  Genitourinary: Negative for dysuria.  Musculoskeletal: Positive for arthralgias, back pain and gait problem. Negative for joint swelling, myalgias, neck pain and neck stiffness.  Skin: Negative for rash and wound.  Neurological: Negative for  dizziness and headaches.     Physical Exam Updated Vital Signs BP (!) 146/71 (BP Location: Right Arm)    Pulse 63    Temp 98.8 F (37.1 C) (Oral)    Resp 20    Ht '5\' 8"'  (1.727 m)    Wt 95.3 kg    SpO2 96%    BMI 31.93 kg/m   Physical Exam Vitals signs and nursing note reviewed.  Constitutional:      General: He is not in acute distress.    Appearance: Normal appearance. He is not ill-appearing, toxic-appearing or diaphoretic.  HENT:     Head: Normocephalic.  Eyes:     Conjunctiva/sclera: Conjunctivae normal.  Cardiovascular:     Rate and Rhythm: Normal rate and regular rhythm.  Pulmonary:     Effort: Pulmonary effort is normal.  Musculoskeletal:     Comments: ttp right lower lumbar musculature and R hip. Patient is very uncomfortable and will not sit down due to pain. Antalgic gait but strength, sensation, pulses and reflexes are in tact  Skin:    General: Skin is dry.     Capillary Refill: Capillary refill takes less than 2 seconds.  Neurological:     General: No focal deficit present.     Mental Status: He is alert.     Cranial Nerves: No cranial nerve deficit.     Sensory: Sensation is intact. No sensory deficit.     Motor: No weakness.     Gait: Gait abnormal (antalgic, favoring the right side).     Deep Tendon Reflexes:     Reflex Scores:      Patellar reflexes are 2+ on the right side and 2+ on the left side.    Comments: Normal distal pulses and strength  Psychiatric:        Mood and Affect: Mood normal.      ED Treatments / Results  Labs (all labs ordered are listed, but only abnormal results are displayed) Labs Reviewed - No data to display  EKG None  Radiology Dg Lumbar Spine Complete  Result Date: 08/16/2019 CLINICAL DATA:  Back pain with right leg pain EXAM: LUMBAR SPINE - COMPLETE 4+ VIEW COMPARISON:  MRI 06/05/2015 FINDINGS: There is  no evidence of lumbar spine fracture. Alignment is normal. Mild intervertebral disc space narrowing at L4-5. The  remaining intervertebral disc spaces are relatively well maintained. Minimal degenerative facet arthropathy of the lower lumbar spine. IMPRESSION: Mild lower lumbar spondylosis.  No acute findings. Electronically Signed   By: Davina Poke M.D.   On: 08/16/2019 11:11   Dg Hip Unilat W Or Wo Pelvis 2-3 Views Right  Result Date: 08/16/2019 CLINICAL DATA:  Right hip pain EXAM: DG HIP (WITH OR WITHOUT PELVIS) 2-3V RIGHT COMPARISON:  None. FINDINGS: There is no evidence of hip fracture or dislocation. Mild degenerative changes of the bilateral hip joints. Prominent enthesophyte at the right ASIS. SI joints appear intact and unremarkable. IMPRESSION: Mild degenerative changes of the right hip. No acute osseous findings. Electronically Signed   By: Davina Poke M.D.   On: 08/16/2019 11:09    Procedures Procedures (including critical care time)  Medications Ordered in ED Medications  predniSONE (DELTASONE) tablet 60 mg (60 mg Oral Given 08/16/19 1106)  morphine 4 MG/ML injection 4 mg (4 mg Intramuscular Given 08/16/19 1108)  cyclobenzaprine (FLEXERIL) tablet 5 mg (5 mg Oral Given 08/16/19 1108)     Initial Impression / Assessment and Plan / ED Course  I have reviewed the triage vital signs and the nursing notes.  Pertinent labs & imaging results that were available during my care of the patient were reviewed by me and considered in my medical decision making (see chart for details).  Clinical Course as of Aug 15 1130  Fri Aug 16, 2019  1126 Patient was evaluated for back pain today. Patient has no concerning symptoms or physical exam findings including no fever, no loss of control of bowel or bladder, no urinary retention, no saddle anesthesia, no leg weakness. He was given medication to treat her symptoms and advised to f/u with PMD for further workup including possible PT, medication change, further imaging, etc. He currently does see orthopedics who is scheduling him for a mylegram and  further studies/treatments. He was advised on all concerning symptoms above and to return to the ED if any of them arise.       [KM]    Clinical Course User Index [KM] Alveria Apley, PA-C       Based on review of vitals, medical screening exam, lab work and/or imaging, there does not appear to be an acute, emergent etiology for the patient's symptoms. Counseled pt on good return precautions and encouraged both PCP and ED follow-up as needed.  Prior to discharge, I also discussed incidental imaging findings with patient in detail and advised appropriate, recommended follow-up in detail.  Clinical Impression: 1. Sciatica of right side     Disposition: Discharge  Prior to providing a prescription for a controlled substance, I independently reviewed the patient's recent prescription history on the [place]. The patient had no recent or regular prescriptions and was deemed appropriate for a brief, less than 3 day prescription of narcotic for acute analgesia.  This note was prepared with assistance of Systems analyst. Occasional wrong-word or sound-a-like substitutions may have occurred due to the inherent limitations of voice recognition software.   Final Clinical Impressions(s) / ED Diagnoses   Final diagnoses:  Sciatica of right side    ED Discharge Orders         Ordered    predniSONE (DELTASONE) 10 MG tablet  Daily     08/16/19 1128    cyclobenzaprine (FLEXERIL)  10 MG tablet  2 times daily PRN     08/16/19 1128    oxyCODONE-acetaminophen (PERCOCET/ROXICET) 5-325 MG tablet  Every 6 hours PRN     08/16/19 1130           Kristine Royal 08/16/19 1131    Hayden Rasmussen, MD 08/16/19 1816

## 2019-08-23 ENCOUNTER — Ambulatory Visit: Payer: 59 | Admitting: Cardiology

## 2019-08-27 NOTE — Progress Notes (Signed)
Virtual Visit via Telephone Note   This visit type was conducted due to national recommendations for restrictions regarding the COVID-19 Pandemic (e.g. social distancing) in an effort to limit this patient's exposure and mitigate transmission in our community.  Due to his co-morbid illnesses, this patient is at least at moderate risk for complications without adequate follow up.  This format is felt to be most appropriate for this patient at this time.  The patient did not have access to video technology/had technical difficulties with video requiring transitioning to audio format only (telephone).  All issues noted in this document were discussed and addressed.  No physical exam could be performed with this format.  Please refer to the patient's chart for his  consent to telehealth for [facility].   Date:  08/28/2019   ID:  Troy Martinez, DOB 12-Feb-1961, MRN [id number]  Patient Location: Home Provider Location: Home  PCP:  Street, Sharon Mt, MD  Cardiologist:  Larae Grooms, MD   Evaluation Performed:  Follow-Up Visit  Chief Complaint:  Follow up/pre-operative clearance, seen for Dr. Irish Lack  History of Present Illness:    Troy Martinez is a 58 y.o. male with a history of CAD (s/p prior DES to LCX 2008, DES to RCA 2012, DESx2 to ramus 03/2014,  PTCA to ramus x2 03/2017), GERD, HTN, HLD, obesity, arthritis and DM2.  He was last seen by Dr. Irish Lack on 07/12/2018 in follow up for CAD. He was doing well from a cardiac perspective without anginal symptoms. His physical activity was limited by knee pain.   Today Troy Martinez is doing very well from a cardiac perspective.  He is staying very active including painting his house which is being built without chest pain or other anginal symptoms.  He is fighting a cold at this time for which he was tested for Covid which was negative x2.  Unable to check his BP at this time as he is currently living in a camper while his house is being  built and most of his belongings are packed away.  Reports it has been stable otherwise.  Denies chest pain, shortness of breath, LE swelling, orthopnea, dizziness, fatigue, presyncopal or syncopal episodes.  Denies palpitations.  The patient does not have any unstable cardiac conditions. Upon evaluation today, he can achieve 4 METs or greater without anginal symptoms. According to [facility] and AHA guidelines, he requires no further cardiac workup prior to his myelogram and should be at acceptable risk. His revised cardiac risk index of peri-procedural MI or cardiac arrest following myelogram procedure is low.    The patient does have symptoms concerning for COVID-19 infection (fever, chills, cough, or new shortness of breath).  Patient Covid tested recently x2 with negative results.  Discussed continuous masking, handwashing and social distancing.   Past Medical History:  Diagnosis Date  . Arthritis   . Coronary artery disease    a. DES to LCx (2008)  b. DES to RCA (2012) c. Overlapping DESx2 to large ramus (03/2014). d. [place] 03/2017 s/p cutting ballon angioplasty to ramus 2.  . GERD (gastroesophageal reflux disease)   . High cholesterol   . Hypertension   . Obesity   . Type II diabetes mellitus ([place])    Past Surgical History:  Procedure Laterality Date  . ANTERIOR CERVICAL DECOMP/DISCECTOMY FUSION  05/2005  . BACK SURGERY    . CARDIAC CATHETERIZATION  06/2007   . CORONARY ANGIOPLASTY WITH STENT PLACEMENT  06/2007 11/2010   DES in distal  circumflex ([facility]avor) w normal LV function 11/2010 DES to RCA-Promus  . CORONARY ANGIOPLASTY WITH STENT PLACEMENT  04/04/2014   "2; got a total of 4 now"  . CORONARY BALLOON ANGIOPLASTY Left 04/06/2017   Procedure: Coronary Balloon Angioplasty;  Surgeon: Jettie Booze, MD;  Location: [place] CV LAB;  Service: Cardiovascular;  Laterality: Left;  Cutting balloon to the Mid  Ramus  . DECOMPRESSION FASCIOTOMY LEG Right 2003  . LEFT HEART CATH AND CORONARY  ANGIOGRAPHY N/A 04/06/2017   Procedure: Left Heart Cath and Coronary Angiography;  Surgeon: Jettie Booze, MD;  Location: [place] CV LAB;  Service: Cardiovascular;  Laterality: N/A;  . LEFT HEART CATHETERIZATION WITH CORONARY ANGIOGRAM N/A 04/04/2014   Procedure: LEFT HEART CATHETERIZATION WITH CORONARY ANGIOGRAM;  Surgeon: Jettie Booze, MD;  Location: [facility] CATH LAB;  Service: Cardiovascular;  Laterality: N/A;  . LEG SURGERY Right    "cut scar tissue out"  . ORIF PROXIMAL TIBIAL PLATEAU FRACTURE Right 2003  . PERCUTANEOUS STENT INTERVENTION  04/04/2014   Procedure: PERCUTANEOUS STENT INTERVENTION;  Surgeon: Jettie Booze, MD;  Location: [facility] CATH LAB;  Service: Cardiovascular;;  DES x 2 Ramus 2.5x8, 2.5x32   . TIBIA HARDWARE REMOVAL Right ?2004     Current Meds  Medication Sig  . acetaminophen (TYLENOL) 500 MG tablet Take 2 tablets (1,000 mg total) by mouth daily as needed for moderate pain or headache.  . Ascorbic Acid (VITAMIN C) 500 MG CHEW Chew 250 mg by mouth daily.  Marland Kitchen atorvastatin (LIPITOR) 80 MG tablet Take 1 tablet (80 mg total) by mouth daily.  Marland Kitchen buPROPion (WELLBUTRIN XL) 300 MG 24 hr tablet Take 300 mg by mouth daily.  . Cyanocobalamin (B-12 COMPLIANCE INJECTION) 1000 MCG/ML KIT Inject as directed every 30 (thirty) days.   Marland Kitchen escitalopram (LEXAPRO) 20 MG tablet Take 20 mg by mouth daily.   Marland Kitchen esomeprazole (NEXIUM) 20 MG capsule Take 20 mg by mouth daily at 12 noon.  Marland Kitchen Fe-Succ Ac-C-Thre Ac-B12-FA (FERREX 150 FORTE PLUS) 50-100 MG CAPS Take 1 capsule by mouth every evening.   . fenofibrate 160 MG tablet Take 1 tablet (160 mg total) by mouth daily.  Marland Kitchen glimepiride (AMARYL) 1 MG tablet Take 0.5 mg by mouth daily with breakfast.   . ibuprofen (ADVIL) 200 MG tablet Take 800 mg by mouth every 6 (six) hours as needed for mild pain.  Marland Kitchen JANUMET XR 50-1000 MG TB24 Take 1 tablet by mouth 2 (two) times daily.   Marland Kitchen levocetirizine (XYZAL) 5 MG tablet Take 5 mg by mouth every  evening.   Marland Kitchen lisinopril (PRINIVIL,ZESTRIL) 10 MG tablet Take 0.5 tablets (5 mg total) by mouth daily.  . Multiple Vitamins-Minerals (MULTIVITAMIN PO) Take 1 tablet by mouth daily.  . nebivolol (BYSTOLIC) 5 MG tablet Take 1 tablet (5 mg total) by mouth daily.  . nitroGLYCERIN (NITROSTAT) 0.4 MG SL tablet Place 1 tablet (0.4 mg total) under the tongue every 5 (five) minutes as needed for chest pain.  . ONE TOUCH ULTRA TEST test strip 1 each.   . prasugrel (EFFIENT) 10 MG TABS tablet Take 1 tablet (10 mg total) by mouth daily.  . tamsulosin (FLOMAX) 0.4 MG CAPS capsule Take 0.4 mg by mouth daily after supper.      Allergies:   Patient has no known allergies.   Social History   Tobacco Use  . Smoking status: Former Smoker    Packs/day: 3.00    Years: 23.00    Pack years: 69.00  Types: Cigarettes    Quit date: 01/18/1999    Years since quitting: 20.6  . Smokeless tobacco: Never Used  Substance Use Topics  . Alcohol use: Yes    Alcohol/week: 14.0 standard drinks    Types: 14 Shots of liquor per week    Comment: 04/04/2014 "2 shots/day on average"  . Drug use: Yes    Types: Marijuana    Comment: "quit marijuana in 1994"     Family Hx: The patient's family history includes Heart attack in his father, maternal grandmother, and paternal grandfather; Heart disease in his father; Hypertension in his father; Stroke in his mother.  ROS:   Please see the history of present illness.     All other systems reviewed and are negative.  Prior CV studies:   The following studies were reviewed today:  Cath in 03/2017: 1. Patent RCA stent. 2. Patent circuflex stent. 3. Mid LAD lesion, 25 %stenosed. 4. Ramus-2 lesion, 70 %stenosed, focal area of instent restenosis. The was treated with a 3.0 Wolverine cutting balloon, inflated to 3.25 mm. 5. Post intervention, there is a 0% residual stenosis. 6. The left ventricular systolic function is normal. 7. LV end diastolic pressure is normal. 8. The  left ventricular ejection fraction is 55-65% by visual estimate. 9. There is no aortic valve stenosis. Dist Cx lesion, 75 %stenosed. Unchanged from prior in small vessel.  Labs/Other Tests and Data Reviewed:    EKG:  An ECG dated 07/12/2018 was personally reviewed today and demonstrated:  Sinus bradycardia, HR 57 bpm  Recent Labs: No results found for requested labs within last 8760 hours.   Recent Lipid Panel Lab Results  Component Value Date/Time   CHOL 123 12/01/2016 09:02 AM   TRIG 192 (H) 12/01/2016 09:02 AM   HDL 24 (L) 12/01/2016 09:02 AM   CHOLHDL 5.1 (H) 12/01/2016 09:02 AM   LDLCALC 61 12/01/2016 09:02 AM    Wt Readings from Last 3 Encounters:  08/28/19 195 lb (88.5 kg)  08/16/19 210 lb (95.3 kg)  07/12/18 199 lb 12.8 oz (90.6 kg)     Objective:    Vital Signs:  Ht '5\' 8"'  (1.727 m)   Wt 195 lb (88.5 kg)   BMI 29.65 kg/m   VITAL SIGNS:  reviewed GEN:  no acute distress RESPIRATORY:  normal respiratory effort, symmetric expansion NEURO:  alert and oriented x 3, no obvious focal deficit PSYCH:  normal affect  ASSESSMENT & PLAN:    1. CAD s/p PCI: -No anginal symptoms  -On Effient>>>acceptable to hold for 5 days prior to procedure per Dr. Irish Lack -Continue high intensity statin, lisinopril, bystolic -We will have him come to the office for lab work including c-Met, lipid panel and CBC along with a preoperative EKG  2. HLD: -We will obtain more recent lab work as above -Continue high intensity statin   3. HTN: -No ability for BP today however patient reports is been stable -Continue current regimen  4. Preoperative clearance for myelogram: -The patient does not have any unstable cardiac conditions. Upon evaluation today, he can achieve 4 METs or greater without anginal symptoms. According to [facility] and AHA guidelines, he requires no further cardiac workup prior to his noncardiac procedure and should be at acceptable risk. Revised cardiac risk index of  peri-procedural MI or cardiac arrest following procedure found to be low at 0.9%.   -Pt is on Effient and per Dr. Irish Lack, he may hold for 5 days prior to procedure then resume when acceptable from  a procedural standpoint.  -Patient is seen via telemedicine with no access to EKG therefore will have EKG performed in office with RN visit prior to procedure   COVID-19 Education: The signs and symptoms of COVID-19 were discussed with the patient and how to seek care for testing (follow up with PCP or arrange E-visit).  The importance of social distancing was discussed today.  Time:   Today, I have spent 20 minutes with the patient with telehealth technology discussing the above problems.     Medication Adjustments/Labs and Tests Ordered: Current medicines are reviewed at length with the patient today.  Concerns regarding medicines are outlined above.   Tests Ordered: No orders of the defined types were placed in this encounter.   Medication Changes: No orders of the defined types were placed in this encounter.   Follow Up:  Either In Person or Virtual Dr. Irish Lack in 9-12 months   Signed, Kathyrn Drown, NP  08/28/2019 10:20 AM    [place]

## 2019-08-28 ENCOUNTER — Other Ambulatory Visit: Payer: Self-pay

## 2019-08-28 ENCOUNTER — Telehealth (INDEPENDENT_AMBULATORY_CARE_PROVIDER_SITE_OTHER): Payer: 59 | Admitting: Cardiology

## 2019-08-28 VITALS — Ht 68.0 in | Wt 195.0 lb

## 2019-08-28 DIAGNOSIS — I10 Essential (primary) hypertension: Secondary | ICD-10-CM

## 2019-08-28 DIAGNOSIS — Z01810 Encounter for preprocedural cardiovascular examination: Secondary | ICD-10-CM | POA: Diagnosis not present

## 2019-08-28 DIAGNOSIS — I25119 Atherosclerotic heart disease of native coronary artery with unspecified angina pectoris: Secondary | ICD-10-CM

## 2019-08-28 MED ORDER — LISINOPRIL 10 MG PO TABS: 5.0000 mg | ORAL_TABLET | Freq: Every day | ORAL | 3 refills | Status: DC

## 2019-08-28 NOTE — Addendum Note (Signed)
Addended by: Mady Haagensen on: 08/28/2019 10:33 AM   Modules accepted: Orders

## 2019-08-28 NOTE — Patient Instructions (Signed)
Medication Instructions:   Your physician recommends that you continue on your current medications as directed. Please refer to the Current Medication list given to you today.  *If you need a refill on your cardiac medications before your next appointment, please call your pharmacy*  Lab Work:  Your physician recommends that you return for lab work on 09/04/19 at [facility] for a CMET and Lipid panel.  If you have labs (blood work) drawn today and your tests are completely normal, you will receive your results only by: Marland Kitchen MyChart Message (if you have MyChart) OR . A paper copy in the mail If you have any lab test that is abnormal or we need to change your treatment, we will call you to review the results.  Testing/Procedures:  None ordered today  Follow-Up: At [facility], you and your health needs are our priority.  As part of our continuing mission to provide you with exceptional heart care, we have created designated Provider Care Teams.  These Care Teams include your primary Cardiologist (physician) and Advanced Practice Providers (APPs -  Physician Assistants and Nurse Practitioners) who all work together to provide you with the care you need, when you need it.  Your next appointment:   9 month(s)  The format for your next appointment:   Either In Person or Virtual  Provider:   You may see Larae Grooms, MD or one of the following Advanced Practice Providers on your designated Care Team:    Melina Copa, PA-C  Ermalinda Barrios, PA-C   Other Instructions  You will have an EKG on 09/04/19 as well for pre-operative clearance after your blood work.

## 2019-08-28 NOTE — Telephone Encounter (Addendum)
I called the number for Katy at [facility] and left a message that Dr Irish Lack will need to clear the patient to be off Effient for more than 5 days, (7 days requested). I will forward to Dr Irish Lack for his recommendations.  Kerin Ransom PA-C 08/28/2019 2:37 PM

## 2019-08-28 NOTE — Telephone Encounter (Signed)
Katy from [facility] calling to follow up on the clearance. She would like to speak with someone and states the patient was approved to be off the medication prasugrel (EFFIENT) 10 MG TABS tablet for 5 days and he needs to be off for 7 days.  She can be reached at: [phone]

## 2019-08-30 NOTE — Telephone Encounter (Signed)
OK to hold Effient for 7 days prior.   JV

## 2019-09-03 NOTE — Telephone Encounter (Signed)
Patient returned call

## 2019-09-03 NOTE — Telephone Encounter (Signed)
   Primary Cardiologist: Larae Grooms, MD  Chart reviewed as part of pre-operative protocol coverage. Given past medical history and time since last visit, based on ACC/AHA guidelines, Troy Martinez would be at acceptable risk for the planned procedure without further cardiovascular testing.   Per Dr. Irish Lack, patient may hold Effient 7 days prior to procedure then resume when safe from a procedural standpoint thereafter.   I will route this recommendation to the requesting party via Epic fax function and remove from pre-op pool.  Please call with questions.  Kathyrn Drown, NP 09/03/2019, 9:07 AM

## 2019-09-03 NOTE — Telephone Encounter (Signed)
Patient will need medical clearance before pharmacy clearance can be sent. Called patient on 09/03/2019 and left VM.

## 2019-09-04 ENCOUNTER — Other Ambulatory Visit: Payer: 59 | Admitting: *Deleted

## 2019-09-04 ENCOUNTER — Ambulatory Visit (INDEPENDENT_AMBULATORY_CARE_PROVIDER_SITE_OTHER): Payer: 59

## 2019-09-04 ENCOUNTER — Other Ambulatory Visit: Payer: Self-pay

## 2019-09-04 VITALS — HR 67 | Ht 68.0 in | Wt 198.0 lb

## 2019-09-04 DIAGNOSIS — Z01810 Encounter for preprocedural cardiovascular examination: Secondary | ICD-10-CM

## 2019-09-04 DIAGNOSIS — I10 Essential (primary) hypertension: Secondary | ICD-10-CM

## 2019-09-04 DIAGNOSIS — I25119 Atherosclerotic heart disease of native coronary artery with unspecified angina pectoris: Secondary | ICD-10-CM

## 2019-09-04 LAB — COMPREHENSIVE METABOLIC PANEL
ALT: 21 IU/L (ref 0–44)
AST: 23 IU/L (ref 0–40)
Albumin/Globulin Ratio: 1.6 (ref 1.2–2.2)
Albumin: 4 g/dL (ref 3.8–4.9)
Alkaline Phosphatase: 66 IU/L (ref 39–117)
BUN/Creatinine Ratio: 19 (ref 9–20)
BUN: 15 mg/dL (ref 6–24)
Bilirubin Total: 0.2 mg/dL (ref 0.0–1.2)
CO2: 27 mmol/L (ref 20–29)
Calcium: 9.3 mg/dL (ref 8.7–10.2)
Chloride: 101 mmol/L (ref 96–106)
Creatinine, Ser: 0.78 mg/dL (ref 0.76–1.27)
GFR calc Af Amer: 115 mL/min/{1.73_m2} (ref >59)
GFR calc non Af Amer: 99 mL/min/{1.73_m2} (ref >59)
Globulin, Total: 2.5 g/dL (ref 1.5–4.5)
Glucose: 164 mg/dL — ABNORMAL HIGH (ref 65–99)
Potassium: 4.2 mmol/L (ref 3.5–5.2)
Sodium: 142 mmol/L (ref 134–144)
Total Protein: 6.5 g/dL (ref 6.0–8.5)

## 2019-09-04 LAB — LIPID PANEL
Chol/HDL Ratio: 4.3 ratio (ref 0.0–5.0)
Cholesterol, Total: 142 mg/dL (ref 100–199)
HDL: 33 mg/dL — ABNORMAL LOW (ref >39)
LDL Chol Calc (NIH): 57 mg/dL (ref 0–99)
Triglycerides: 340 mg/dL — ABNORMAL HIGH (ref 0–149)
VLDL Cholesterol Cal: 52 mg/dL — ABNORMAL HIGH (ref 5–40)

## 2019-09-04 NOTE — Telephone Encounter (Signed)
    In office EKG reviewed by Dr. Irish Lack and myself which shows NSR and no ST-T wave changes.   Kathyrn Drown NP-C [facility]

## 2019-09-04 NOTE — Telephone Encounter (Signed)
   Primary Cardiologist: Larae Grooms, MD  Chart reviewed as part of pre-operative protocol coverage. Given past medical history and time since last visit, based on ACC/AHA guidelines, LANNIS LICHTENWALNER would be at acceptable risk for the planned procedure without further cardiovascular testing.   Will fax full cardiac clearance note. Per Dr. Irish Lack, patient may hold Effient 7 days prior to procedure then resume when safe from a procedural standpoint thereafter.   I will route this recommendation to the requesting party via Epic fax function and remove from pre-op pool.  Please call with questions.  Kathyrn Drown, NP 09/04/2019, 10:09 AM

## 2019-09-04 NOTE — Progress Notes (Signed)
The patient was in today for EKG per Kathyrn Drown for surgical clearance. He has no complaints.  Julaine Hua, CMA, completed EKG and reviewed with Dr. Irish Lack, [place]. Per Dr. Irish Lack, EKG shows "NSR, no ST changes." Will scan in system for [facility] to review.

## 2019-09-06 ENCOUNTER — Ambulatory Visit
Admission: RE | Admit: 2019-09-06 | Discharge: 2019-09-06 | Disposition: A | Discharge status: Home / Self Care | Payer: 59 | Source: Ambulatory Visit | Attending: Orthopedic Surgery | Admitting: Orthopedic Surgery

## 2019-09-06 ENCOUNTER — Other Ambulatory Visit: Payer: Self-pay

## 2019-09-06 DIAGNOSIS — G8929 Other chronic pain: Secondary | ICD-10-CM

## 2019-09-06 DIAGNOSIS — M545 Low back pain, unspecified: Secondary | ICD-10-CM

## 2019-09-06 MED ORDER — IOPAMIDOL (ISOVUE-M 200) INJECTION 41%: 12.0000 mL | Freq: Once | INTRAMUSCULAR | Status: DC

## 2019-09-06 MED ORDER — DIAZEPAM 5 MG PO TABS
5.0000 mg | ORAL_TABLET | Freq: Once | ORAL | Status: AC
  Administered 2019-09-06: 5 mg via ORAL

## 2019-09-06 MED ORDER — ONDANSETRON HCL 4 MG/2ML IJ SOLN: 4.0000 mg | Freq: Four times a day (QID) | INTRAMUSCULAR | Status: DC | PRN

## 2019-09-06 MED ORDER — IOPAMIDOL (ISOVUE-M 300) INJECTION 61%: 15.0000 mL | Freq: Once | INTRAMUSCULAR | Status: DC | PRN

## 2019-09-06 NOTE — Progress Notes (Signed)
Patient states he has been off Lexapro and Wellbutrin for at least the past two days and off Effient for at least the past seven days.

## 2019-09-06 NOTE — Discharge Instructions (Signed)
Myelogram Discharge Instructions  1. Go home and rest quietly for the next 24 hours.  It is important to lie flat for the next 24 hours.  Get up only to go to the restroom.  You may lie in the bed or on a couch on your back, your stomach, your left side or your right side.  You may have one pillow under your head.  You may have pillows between your knees while you are on your side or under your knees while you are on your back.  2. DO NOT drive today.  Recline the seat as far back as it will go, while still wearing your seat belt, on the way home.  3. You may get up to go to the bathroom as needed.  You may sit up for 10 minutes to eat.  You may resume your normal diet and medications unless otherwise indicated.  Drink plenty of extra fluids today and tomorrow.  4. The incidence of a spinal headache with nausea and/or vomiting is about 5% (one in 20 patients).  If you develop a headache, lie flat and drink plenty of fluids until the headache goes away.  Caffeinated beverages may be helpful.  If you develop severe nausea and vomiting or a headache that does not go away with flat bed rest, call [phone].  5. You may resume normal activities after your 24 hours of bed rest is over; however, do not exert yourself strongly or do any heavy lifting tomorrow.  6. Call your physician for a follow-up appointment.    You may resume Effient today.  You may resume Lexapro and Wellbutrin on Saturday, September 07, 2019 after 9:30a.m.

## 2019-09-09 ENCOUNTER — Telehealth: Payer: Self-pay

## 2019-09-09 MED ORDER — EZETIMIBE 10 MG PO TABS: 10.0000 mg | ORAL_TABLET | Freq: Every day | ORAL | 3 refills | Status: DC

## 2019-09-09 NOTE — Telephone Encounter (Signed)
I called and spoke with patient about lab results. He will start Zetia once a day.

## 2019-09-10 ENCOUNTER — Other Ambulatory Visit: Payer: Self-pay

## 2019-09-10 MED ORDER — PRASUGREL HCL 10 MG PO TABS: 10.0000 mg | ORAL_TABLET | Freq: Every day | ORAL | 3 refills | Status: DC

## 2019-09-10 MED ORDER — ATORVASTATIN CALCIUM 80 MG PO TABS: 80.0000 mg | ORAL_TABLET | Freq: Every day | ORAL | 0 refills | Status: DC

## 2019-09-10 MED ORDER — NEBIVOLOL HCL 5 MG PO TABS: 5.0000 mg | ORAL_TABLET | Freq: Every day | ORAL | 0 refills | Status: DC

## 2019-09-10 MED ORDER — FENOFIBRATE 160 MG PO TABS: 160.0000 mg | ORAL_TABLET | Freq: Every day | ORAL | 0 refills | Status: DC

## 2019-09-27 ENCOUNTER — Other Ambulatory Visit: Payer: Self-pay | Admitting: Interventional Cardiology

## 2019-10-01 ENCOUNTER — Telehealth: Payer: Self-pay | Admitting: *Deleted

## 2019-10-01 NOTE — Telephone Encounter (Signed)
   [facility] Pre-operative Risk Assessment    Request for surgical clearance:  1. What type of surgery is being performed? L4-5 R DISCECTOMY  2. When is this surgery scheduled? TBD   3. What type of clearance is required (medical clearance vs. Pharmacy clearance to hold med vs. Both)? MEDICAL  4. Are there any medications that need to be held prior to surgery and how long? EFFIENT   5. Practice name and name of physician performing surgery? [facility]; DR. Rolena Infante  6. What is your office phone number [phone]    7.   What is your office fax number [phone]  8.   Anesthesia type (None, local, MAC, general) ? NOT LISTED   Julaine Hua 10/01/2019, 9:48 AM  _________________________________________________________________   (provider comments below)

## 2019-10-01 NOTE — Telephone Encounter (Signed)
   Primary Cardiologist: Lance Muss, MD  Chart reviewed as part of pre-operative protocol coverage. Patient has a history of CAD (s/p prior DES to LCX 2008, DES to RCA 2012, DESx2 to ramus 03/2014,  PTCA to ramus x2 03/2017), GERD, HTN, HLD, obesity, arthritis and DM2. He was recently seen for a virtual visit with Georgie Chard on 08/28/2019 for pre-op clearance for a myelogram. He was doing well from a cardiac standpoint at that time. He had his myelogram and now Ortho is planning for L4-5 right discectomy. I called patient today and he reports no changes since his virtual visit last month. No chest pain, shortness of breath, CHF symptoms, palpitations, or sycope. He is still able to complete > 4 METS without any anginal symptoms. Per Revised Cardiac Risk Index, considered low risk procedure.  Based on ACC/AHA guidelines, Troy Martinez would be at acceptable risk for the planned procedure without further cardiovascular testing.   Dr. Eldridge Dace recently gave the OK to hold Effient for 5-7 days prior to myelogram. Therefore, OK to hold for same duration for upcoming discectomy with plans to resume it when acceptable from a procedural standpoint.   I will route this recommendation to the requesting party via Epic fax function and remove from pre-op pool.  Please call with questions.  Corrin Parker, PA-C 10/01/2019, 11:16 AM

## 2020-03-04 ENCOUNTER — Ambulatory Visit: Payer: 59 | Admitting: Sports Medicine

## 2020-03-04 ENCOUNTER — Encounter: Payer: Self-pay | Admitting: Sports Medicine

## 2020-03-04 ENCOUNTER — Other Ambulatory Visit: Payer: Self-pay

## 2020-03-04 ENCOUNTER — Ambulatory Visit (INDEPENDENT_AMBULATORY_CARE_PROVIDER_SITE_OTHER): Payer: 59

## 2020-03-04 ENCOUNTER — Other Ambulatory Visit: Payer: Self-pay | Admitting: Sports Medicine

## 2020-03-04 DIAGNOSIS — Q828 Other specified congenital malformations of skin: Secondary | ICD-10-CM

## 2020-03-04 DIAGNOSIS — M779 Enthesopathy, unspecified: Secondary | ICD-10-CM

## 2020-03-04 DIAGNOSIS — M79671 Pain in right foot: Secondary | ICD-10-CM

## 2020-03-04 DIAGNOSIS — Q663 Other congenital varus deformities of feet, unspecified foot: Secondary | ICD-10-CM | POA: Diagnosis not present

## 2020-03-04 DIAGNOSIS — E1140 Type 2 diabetes mellitus with diabetic neuropathy, unspecified: Secondary | ICD-10-CM

## 2020-03-04 DIAGNOSIS — M79672 Pain in left foot: Secondary | ICD-10-CM

## 2020-03-04 MED ORDER — TRIAMCINOLONE ACETONIDE 10 MG/ML IJ SUSP
10.0000 mg | Freq: Once | INTRAMUSCULAR | Status: AC
  Administered 2020-03-04: 10 mg

## 2020-03-04 NOTE — Progress Notes (Signed)
Subjective: Troy Martinez is a 59 y.o. male patient who presents to office for evaluation of right foot pain reports that since April has had pain along the lateral side of the foot that is dull and achy in nature reports that pain when he is walking or putting pressure on the foot is worse 6 out of 10 in nature with some numbness and tingling at toes but does admit to a history of sciatic nerve issues and has tried icing and stretching and Tylenol with no improvement.  Patient also admits that at the bottom of the right foot there is also callus area that he has been trying to keep trim down the best he can but it tends to build back up and gets a little sore underneath the big toe joint.  Patient denies any redness warmth swelling drainage or any other acute symptoms.  Patient is diabetic with last blood sugar not recorded.  Last A1c 7.5 and last visit to PCP was 1 year ago   Review of systems noncontributory  Patient Active Problem List   Diagnosis Date Noted  . Dysfunction of both eustachian tubes 10/16/2017  . Angina pectoris ([place])   . Acute right-sided low back pain with right-sided sciatica 09/22/2016  . Right leg pain 09/22/2016  . Unstable angina ([place]) 04/05/2014  . Coronary artery disease involving native coronary artery of native heart with angina pectoris ([place])   . GERD (gastroesophageal reflux disease)   . Type II diabetes mellitus ([place])   . Mixed hyperlipidemia 11/27/2013  . Essential hypertension, benign 11/27/2013  . Obesity 11/27/2013    Current Outpatient Medications on File Prior to Visit  Medication Sig Dispense Refill  . acetaminophen (TYLENOL) 500 MG tablet Take 2 tablets (1,000 mg total) by mouth daily as needed for moderate pain or headache.    . Ascorbic Acid (VITAMIN C) 500 MG CHEW Chew 250 mg by mouth daily.    Marland Kitchen atorvastatin (LIPITOR) 80 MG tablet TAKE 1 TABLET BY MOUTH ONCE DAILY 90 tablet 3  . buPROPion (WELLBUTRIN XL) 300 MG 24 hr tablet Take 300 mg by  mouth daily.    Marland Kitchen BYSTOLIC 5 MG tablet TAKE 1 TABLET BY MOUTH ONCE DAILY 90 tablet 3  . Cyanocobalamin (B-12 COMPLIANCE INJECTION) 1000 MCG/ML KIT Inject as directed every 30 (thirty) days.     . cyclobenzaprine (FLEXERIL) 10 MG tablet     . escitalopram (LEXAPRO) 20 MG tablet Take 20 mg by mouth daily.     Marland Kitchen esomeprazole (NEXIUM) 20 MG capsule Take 20 mg by mouth daily at 12 noon.    . ezetimibe (ZETIA) 10 MG tablet Take 1 tablet (10 mg total) by mouth daily. 90 tablet 3  . Fe-Succ Ac-C-Thre Ac-B12-FA (FERREX 150 FORTE PLUS) 50-100 MG CAPS Take 1 capsule by mouth every evening.     . fenofibrate 160 MG tablet TAKE 1 TABLET BY MOUTH ONCE DAILY 90 tablet 3  . glimepiride (AMARYL) 1 MG tablet Take 0.5 mg by mouth daily with breakfast.     . ibuprofen (ADVIL) 200 MG tablet Take 800 mg by mouth every 6 (six) hours as needed for mild pain.    Marland Kitchen JANUMET XR 50-1000 MG TB24 Take 1 tablet by mouth 2 (two) times daily.   3  . levocetirizine (XYZAL) 5 MG tablet Take 5 mg by mouth every evening.     Marland Kitchen lisinopril (ZESTRIL) 10 MG tablet Take 0.5 tablets (5 mg total) by mouth daily. 45 tablet 3  .  Multiple Vitamins-Minerals (MULTIVITAMIN PO) Take 1 tablet by mouth daily.    . nitroGLYCERIN (NITROSTAT) 0.4 MG SL tablet Place 1 tablet (0.4 mg total) under the tongue every 5 (five) minutes as needed for chest pain. 25 tablet 3  . ONE TOUCH ULTRA TEST test strip 1 each.     . prasugrel (EFFIENT) 10 MG TABS tablet Take 1 tablet (10 mg total) by mouth daily. 90 tablet 3  . tamsulosin (FLOMAX) 0.4 MG CAPS capsule Take 0.4 mg by mouth daily after supper.      No current facility-administered medications on file prior to visit.    No Known Allergies  Objective:  General: Alert and oriented x3 in no acute distress  Dermatology: No open lesions bilateral lower extremities, all surgical scars on right lower leg, callus submet 5 on the right with no acute signs of infection, no webspace macerations, no ecchymosis  bilateral, all nails x 10 are well manicured.  Vascular: Dorsalis Pedis and Posterior Tibial pedal pulses palpable, Capillary Fill Time 3 seconds,(+) pedal hair growth bilateral, no edema bilateral lower extremities, Temperature gradient within normal limits.  Neurology: Johney Maine sensation intact via light touch bilateral, subjective numbness and tingling toes 1 through 3 on the right history of sciatica and compartment syndrome with multiple scars along the right lower leg.  Musculoskeletal: Mild tenderness with palpation at fifth metatarsal base on the right foot, there is also mild pain at the fifth metatarsophalangeal joint on the right foot, there is varus foot type noted on the right.  Gait: Antalgic gait  Xrays  Right foot   Impression: No fracture or dislocation there is mild increase of the fifth inter- metatarsal angle with early tailor's bunion deformity noted.  Mild soft tissue swelling.  No other acute findings.  Assessment and Plan: Problem List Items Addressed This Visit      Endocrine   Type II diabetes mellitus ([place])    Other Visit Diagnoses    Tendinitis    -  Primary   Right foot pain       Porokeratosis       Varus deformity of foot            -Complete examination performed -Xrays reviewed -Discussed treatment options for tendinitis secondary to inflammation in foot structure as well as callus -At no additional charge mechanically debrided callus plantar right foot using a sterile chisel blade without incident and applied salinocaine and Band-Aid to the area -After oral consent and aseptic prep, injected a mixture containing 1 ml of 2%  plain lidocaine, 1 ml 0.5% plain marcaine, 0.5 ml of kenalog 10 and 0.5 ml of dexamethasone phosphate into right fifth metatarsal base without complication. Post-injection care discussed with patient.  -Dispensed ankle gauntlet for patient to strap from medial to lateral to take pressure off the peroneal tendon insertion at the  fifth metatarsal base until patient can be properly fitted for diabetic insoles/orthotics -Patient to return to office [facility] or [facility] for orthotics or sooner if condition worsens.  Advised patient since he is diabetic would also recommend for him to return to office for routine nail and callus care as well.  Landis Martins, DPM

## 2020-03-05 ENCOUNTER — Other Ambulatory Visit: Payer: Self-pay | Admitting: Sports Medicine

## 2020-03-05 DIAGNOSIS — M779 Enthesopathy, unspecified: Secondary | ICD-10-CM

## 2020-03-13 ENCOUNTER — Ambulatory Visit: Payer: 59 | Admitting: Orthotics

## 2020-03-13 ENCOUNTER — Other Ambulatory Visit: Payer: Self-pay

## 2020-03-13 DIAGNOSIS — M779 Enthesopathy, unspecified: Secondary | ICD-10-CM | POA: Diagnosis not present

## 2020-03-13 DIAGNOSIS — Q663 Other congenital varus deformities of feet, unspecified foot: Secondary | ICD-10-CM

## 2020-03-13 DIAGNOSIS — M79671 Pain in right foot: Secondary | ICD-10-CM

## 2020-03-13 DIAGNOSIS — Q828 Other specified congenital malformations of skin: Secondary | ICD-10-CM

## 2020-03-13 DIAGNOSIS — E1140 Type 2 diabetes mellitus with diabetic neuropathy, unspecified: Secondary | ICD-10-CM

## 2020-03-13 NOTE — Progress Notes (Signed)
Saw patient today for casting CMFO:  RF varus and high arch: 1) hug arch, 2) lateral wedge, offload 5th

## 2020-04-21 ENCOUNTER — Other Ambulatory Visit: Payer: Self-pay

## 2020-04-21 ENCOUNTER — Other Ambulatory Visit: Payer: 59 | Admitting: Orthotics

## 2020-06-05 NOTE — Progress Notes (Signed)
Cardiology Office Note   Date:  06/09/2020   ID:  Troy Martinez, DOB May 30, 1961, MRN [id number]  PCP:  Street, Sharon Mt, MD    No chief complaint on file.  CAD  Wt Readings from Last 3 Encounters:  06/09/20 200 lb (90.7 kg)  09/04/19 198 lb (89.8 kg)  08/28/19 195 lb (88.5 kg)       History of Present Illness: Troy Martinez is a 59 y.o. male  with history of CAD (s/p prior DES to LCX 2008, DES to RCA 2012, DESx2 to ramus 03/2014, recent PTCA to ramus 2 03/2017), GERD, HTN, HLD, obesity, arthritis, DM2.  Cath in 7/18 showed: 1. Patent RCA stent. 2. Patent circuflex stent. 3. Mid LAD lesion, 25 %stenosed. 4. Ramus-2 lesion, 70 %stenosed, focal area of instent restenosis. The was treated with a 3.0 Wolverine cutting balloon, inflated to 3.25 mm. 5. Post intervention, there is a 0% residual stenosis. 6. The left ventricular systolic function is normal. 7. LV end diastolic pressure is normal. 8. The left ventricular ejection fraction is 55-65% by visual estimate. 9. There is no aortic valve stenosis. Dist Cx lesion, 75 %stenosed. Unchanged from prior in small vessel.   He has had chronic knee issues requiring injections in the past.  Denies : exertional Chest pain. Dizziness. Leg edema.  Orthopnea. Palpitations. Paroxysmal nocturnal dyspnea. Shortness of breath. Syncope.   He did get his COVID vaccines.    He has cut back on his alcohol use which was daily.  He has gained weight since then.  They were living in a camper during much of 2020 and early 2021.  His diet and exercise suffered.     Past Medical History:  Diagnosis Date  . Arthritis   . Coronary artery disease    a. DES to LCx (2008)  b. DES to RCA (2012) c. Overlapping DESx2 to large ramus (03/2014). d. [place] 03/2017 s/p cutting ballon angioplasty to ramus 2.  . GERD (gastroesophageal reflux disease)   . High cholesterol   . Hypertension   . Obesity   . Type II diabetes mellitus ([place])     Past  Surgical History:  Procedure Laterality Date  . ANTERIOR CERVICAL DECOMP/DISCECTOMY FUSION  05/2005  . BACK SURGERY    . CARDIAC CATHETERIZATION  06/2007   . CORONARY ANGIOPLASTY WITH STENT PLACEMENT  06/2007 11/2010   DES in distal circumflex ([place]) w normal LV function 11/2010 DES to RCA-Promus  . CORONARY ANGIOPLASTY WITH STENT PLACEMENT  04/04/2014   "2; got a total of 4 now"  . CORONARY BALLOON ANGIOPLASTY Left 04/06/2017   Procedure: Coronary Balloon Angioplasty;  Surgeon: Jettie Booze, MD;  Location: [facility];  Service: Cardiovascular;  Laterality: Left;  Cutting balloon to the Mid  [place]  . DECOMPRESSION FASCIOTOMY LEG Right 2003  . LEFT HEART CATH AND CORONARY ANGIOGRAPHY N/A 04/06/2017   Procedure: Left Heart Cath and Coronary Angiography;  Surgeon: Jettie Booze, MD;  Location: [facility];  Service: Cardiovascular;  Laterality: N/A;  . LEFT HEART CATHETERIZATION WITH CORONARY ANGIOGRAM N/A 04/04/2014   Procedure: LEFT HEART CATHETERIZATION WITH CORONARY ANGIOGRAM;  Surgeon: Jettie Booze, MD;  Location: [facility] LAB;  Service: Cardiovascular;  Laterality: N/A;  . LEG SURGERY Right    "cut scar tissue out"  . ORIF PROXIMAL TIBIAL PLATEAU FRACTURE Right 2003  . PERCUTANEOUS STENT INTERVENTION  04/04/2014   Procedure: PERCUTANEOUS STENT INTERVENTION;  Surgeon: Jettie Booze, MD;  Location: [facility] C[facility] LAB;  Service: Cardiovascular;;  DES x 2 Ramus 2.5x8, 2.5x32   . TIBIA HARDWARE REMOVAL Right ?2004     Current Outpatient Medications  Medication Sig Dispense Refill  . acetaminophen (TYLENOL) 500 MG tablet Take 2 tablets (1,000 mg total) by mouth daily as needed for moderate pain or headache.    . Ascorbic Acid (VITAMIN C) 500 MG CHEW Chew 250 mg by mouth daily.    Marland Kitchen atorvastatin (LIPITOR) 80 MG tablet TAKE 1 TABLET BY MOUTH ONCE DAILY 90 tablet 3  . buPROPion (WELLBUTRIN XL) 300 MG 24 hr tablet Take 300 mg by mouth daily.    Marland Kitchen BYSTOLIC 5 MG  tablet TAKE 1 TABLET BY MOUTH ONCE DAILY 90 tablet 3  . Cyanocobalamin (B-12 COMPLIANCE INJECTION) 1000 MCG/ML KIT Inject as directed every 30 (thirty) days.     . cyclobenzaprine (FLEXERIL) 10 MG tablet     . escitalopram (LEXAPRO) 20 MG tablet Take 20 mg by mouth daily.     Marland Kitchen esomeprazole (NEXIUM) 20 MG capsule Take 20 mg by mouth daily at 12 noon.    . ezetimibe (ZETIA) 10 MG tablet Take 1 tablet (10 mg total) by mouth daily. 90 tablet 3  . Fe-Succ Ac-C-Thre Ac-B12-FA (FERREX 150 FORTE PLUS) 50-100 MG CAPS Take 1 capsule by mouth every evening.     . fenofibrate 160 MG tablet TAKE 1 TABLET BY MOUTH ONCE DAILY 90 tablet 3  . glimepiride (AMARYL) 1 MG tablet Take 0.5 mg by mouth daily with breakfast.     . ibuprofen (ADVIL) 200 MG tablet Take 800 mg by mouth every 6 (six) hours as needed for mild pain.    Marland Kitchen JANUMET XR 50-1000 MG TB24 Take 1 tablet by mouth 2 (two) times daily.   3  . levocetirizine (XYZAL) 5 MG tablet Take 5 mg by mouth every evening.     Marland Kitchen lisinopril (ZESTRIL) 10 MG tablet Take 0.5 tablets (5 mg total) by mouth daily. 45 tablet 3  . Multiple Vitamins-Minerals (MULTIVITAMIN PO) Take 1 tablet by mouth daily.    . nitroGLYCERIN (NITROSTAT) 0.4 MG SL tablet Place 1 tablet (0.4 mg total) under the tongue every 5 (five) minutes as needed for chest pain. 25 tablet 3  . ONE TOUCH ULTRA TEST test strip 1 each.     . prasugrel (EFFIENT) 10 MG TABS tablet Take 1 tablet (10 mg total) by mouth daily. 90 tablet 3  . tamsulosin (FLOMAX) 0.4 MG CAPS capsule Take 0.4 mg by mouth daily after supper.      No current facility-administered medications for this visit.    Allergies:   Patient has no known allergies.    Social History:  The patient  reports that he quit smoking about 21 years ago. His smoking use included cigarettes. He has a 69.00 pack-year smoking history. He has never used smokeless tobacco. He reports current alcohol use of about 14.0 standard drinks of alcohol per week. He  reports current drug use. Drug: Marijuana.   Family History:  The patient's family history includes Heart attack in his father, maternal grandmother, and paternal grandfather; Heart disease in his father; Hypertension in his father; Stroke in his mother.    ROS:  Please see the history of present illness.   Otherwise, review of systems are positive for toe numbness.   All other systems are reviewed and negative.    PHYSICAL EXAM: VS:  BP (!) 114/58   Pulse 61   Ht 5'  8" (1.727 m)   Wt 200 lb (90.7 kg)   SpO2 95%   BMI 30.41 kg/m  , BMI Body mass index is 30.41 kg/m. GEN: Well nourished, well developed, in no acute distress  HEENT: normal  Neck: no JVD, carotid bruits, or masses Cardiac: RRR; no murmurs, rubs, or gallops,no edema  Respiratory:  clear to auscultation bilaterally, normal work of breathing GI: soft, nontender, nondistended, + BS MS: no deformity or atrophy ; 2+ PT pulses bilaterally Skin: warm and dry, no rash Neuro:  Strength and sensation are intact Psych: euthymic mood, full affect   EKG:   The ekg ordered 12 /2020 demonstrates NSR, no ST changes   Recent Labs: 09/04/2019: ALT 21; BUN 15; Creatinine, Ser 0.78; Potassium 4.2; Sodium 142   Lipid Panel    Component Value Date/Time   CHOL 142 09/04/2019 0854   TRIG 340 (H) 09/04/2019 0854   HDL 33 (L) 09/04/2019 0854   CHOLHDL 4.3 09/04/2019 0854   LDLCALC 57 09/04/2019 0854     Other studies Reviewed: Additional studies/ records that were reviewed today with results demonstrating: 08/2019 labs reviewed.   ASSESSMENT AND PLAN:  1. CAD: No angina.  Continue aggressive secondary prevention.  Refill SL NTG.   2. Hyperlipidemia:  TG have been high in the past.  Labs to be checked by PMD in the near future.  Whole food, plant based diet recommended.  Congratulted him on stopping alcohol.  Would expect that his numbers would improve.  3. HTN: The current medical regimen is effective;  continue present plan  and medications.   4. DM: He recalls having a 7 as his last reading with PMD.  Whole food plant based diet recommended. Avoid processed foods.    Current medicines are reviewed at length with the patient today.  The patient concerns regarding his medicines were addressed.  The following changes have been made:  No change  Labs/ tests ordered today include:  No orders of the defined types were placed in this encounter.   Recommend 150 minutes/week of aerobic exercise Low fat, low carb, high fiber diet recommended  Disposition:   FU in 9 months   Signed, Larae Grooms, MD  06/09/2020 8:37 AM    [facility] [place], [place], [place]  [address] Phone: [phone]; Fax: [phone]

## 2020-06-09 ENCOUNTER — Encounter: Payer: Self-pay | Admitting: Interventional Cardiology

## 2020-06-09 ENCOUNTER — Other Ambulatory Visit: Payer: Self-pay

## 2020-06-09 ENCOUNTER — Ambulatory Visit: Payer: 59 | Admitting: Interventional Cardiology

## 2020-06-09 VITALS — BP 114/58 | HR 61 | Ht 68.0 in | Wt 200.0 lb

## 2020-06-09 DIAGNOSIS — E782 Mixed hyperlipidemia: Secondary | ICD-10-CM

## 2020-06-09 DIAGNOSIS — I25119 Atherosclerotic heart disease of native coronary artery with unspecified angina pectoris: Secondary | ICD-10-CM | POA: Diagnosis not present

## 2020-06-09 DIAGNOSIS — I10 Essential (primary) hypertension: Secondary | ICD-10-CM | POA: Diagnosis not present

## 2020-06-09 DIAGNOSIS — E1159 Type 2 diabetes mellitus with other circulatory complications: Secondary | ICD-10-CM | POA: Diagnosis not present

## 2020-06-09 MED ORDER — LISINOPRIL 10 MG PO TABS: 5.0000 mg | ORAL_TABLET | Freq: Every day | ORAL | 3 refills | Status: DC

## 2020-06-09 MED ORDER — NITROGLYCERIN 0.4 MG SL SUBL: 0.4000 mg | SUBLINGUAL_TABLET | SUBLINGUAL | 3 refills | Status: DC | PRN

## 2020-06-09 NOTE — Patient Instructions (Signed)
Medication Instructions:  Your physician recommends that you continue on your current medications as directed. Please refer to the Current Medication list given to you today.  *If you need a refill on your cardiac medications before your next appointment, please call your pharmacy*   Lab Work: None  If you have labs (blood work) drawn today and your tests are completely normal, you will receive your results only by: . MyChart Message (if you have MyChart) OR . A paper copy in the mail If you have any lab test that is abnormal or we need to change your treatment, we will call you to review the results.   Testing/Procedures: None   Follow-Up: At [facility], you and your health needs are our priority.  As part of our continuing mission to provide you with exceptional heart care, we have created designated Provider Care Teams.  These Care Teams include your primary Cardiologist (physician) and Advanced Practice Providers (APPs -  Physician Assistants and Nurse Practitioners) who all work together to provide you with the care you need, when you need it.  We recommend signing up for the patient portal called "MyChart".  Sign up information is provided on this After Visit Summary.  MyChart is used to connect with patients for Virtual Visits (Telemedicine).  Patients are able to view lab/test results, encounter notes, upcoming appointments, etc.  Non-urgent messages can be sent to your provider as well.   To learn more about what you can do with MyChart, go to [email or web address].    Your next appointment:   9 month(s)  The format for your next appointment:   In Person  Provider:   You may see Jayadeep Varanasi, MD or one of the following Advanced Practice Providers on your designated Care Team:    Dayna Dunn, PA-C  Michele Lenze, PA-C    Other Instructions  High-Fiber Diet Fiber, also called dietary fiber, is a type of carbohydrate that is found in fruits, vegetables,  whole grains, and beans. A high-fiber diet can have many health benefits. Your health care provider may recommend a high-fiber diet to help:  Prevent constipation. Fiber can make your bowel movements more regular.  Lower your cholesterol.  Relieve the following conditions: ? Swelling of veins in the anus (hemorrhoids). ? Swelling and irritation (inflammation) of specific areas of the digestive tract (uncomplicated diverticulosis). ? A problem of the large intestine (colon) that sometimes causes pain and diarrhea (irritable bowel syndrome, IBS).  Prevent overeating as part of a weight-loss plan.  Prevent heart disease, type 2 diabetes, and certain cancers. What is my plan? The recommended daily fiber intake in grams (g) includes:  38 g for men age 50 or younger.  30 g for men over age 50.  25 g for women age 50 or younger.  21 g for women over age 50. You can get the recommended daily intake of dietary fiber by:  Eating a variety of fruits, vegetables, grains, and beans.  Taking a fiber supplement, if it is not possible to get enough fiber through your diet. What do I need to know about a high-fiber diet?  It is better to get fiber through food sources rather than from fiber supplements. There is not a lot of research about how effective supplements are.  Always check the fiber content on the nutrition facts label of any prepackaged food. Look for foods that contain 5 g of fiber or more per serving.  Talk with a diet and nutrition specialist (dietitian) if   you have questions about specific foods that are recommended or not recommended for your medical condition, especially if those foods are not listed below.  Gradually increase how much fiber you consume. If you increase your intake of dietary fiber too quickly, you may have bloating, cramping, or gas.  Drink plenty of water. Water helps you to digest fiber. What are tips for following this plan?  Eat a wide variety of  high-fiber foods.  Make sure that half of the grains that you eat each day are whole grains.  Eat breads and cereals that are made with whole-grain flour instead of refined flour or white flour.  Eat brown rice, bulgur wheat, or millet instead of white rice.  Start the day with a breakfast that is high in fiber, such as a cereal that contains 5 g of fiber or more per serving.  Use beans in place of meat in soups, salads, and pasta dishes.  Eat high-fiber snacks, such as berries, raw vegetables, nuts, and popcorn.  Choose whole fruits and vegetables instead of processed forms like juice or sauce. What foods can I eat?  Fruits Berries. Pears. Apples. Oranges. Avocado. Prunes and raisins. Dried figs. Vegetables Sweet potatoes. Spinach. Kale. Artichokes. Cabbage. Broccoli. Cauliflower. Green peas. Carrots. Squash. Grains Whole-grain breads. Multigrain cereal. Oats and oatmeal. Brown rice. Barley. Bulgur wheat. Millet. Quinoa. Bran muffins. Popcorn. Rye wafer crackers. Meats and other proteins Navy, kidney, and pinto beans. Soybeans. Split peas. Lentils. Nuts and seeds. Dairy Fiber-fortified yogurt. Beverages Fiber-fortified soy milk. Fiber-fortified orange juice. Other foods Fiber bars. The items listed above may not be a complete list of recommended foods and beverages. Contact a dietitian for more options. What foods are not recommended? Fruits Fruit juice. Cooked, strained fruit. Vegetables Fried potatoes. Canned vegetables. Well-cooked vegetables. Grains White bread. Pasta made with refined flour. White rice. Meats and other proteins Fatty cuts of meat. Fried chicken or fried fish. Dairy Milk. Yogurt. Cream cheese. Sour cream. Fats and oils Butters. Beverages Soft drinks. Other foods Cakes and pastries. The items listed above may not be a complete list of foods and beverages to avoid. Contact a dietitian for more information. Summary  Fiber is a type of  carbohydrate. It is found in fruits, vegetables, whole grains, and beans.  There are many health benefits of eating a high-fiber diet, such as preventing constipation, lowering blood cholesterol, helping with weight loss, and reducing your risk of heart disease, diabetes, and certain cancers.  Gradually increase your intake of fiber. Increasing too fast can result in cramping, bloating, and gas. Drink plenty of water while you increase your fiber.  The best sources of fiber include whole fruits and vegetables, whole grains, nuts, seeds, and beans. This information is not intended to replace advice given to you by your health care provider. Make sure you discuss any questions you have with your health care provider. Document Revised: 07/10/2017 Document Reviewed: 07/10/2017 Elsevier Patient Education  2020 Elsevier Inc.   

## 2020-06-18 ENCOUNTER — Encounter: Payer: Self-pay | Admitting: Podiatry

## 2020-06-18 ENCOUNTER — Ambulatory Visit (INDEPENDENT_AMBULATORY_CARE_PROVIDER_SITE_OTHER): Payer: 59 | Admitting: Podiatry

## 2020-06-18 ENCOUNTER — Other Ambulatory Visit: Payer: Self-pay

## 2020-06-18 DIAGNOSIS — Q663 Other congenital varus deformities of feet, unspecified foot: Secondary | ICD-10-CM

## 2020-06-18 DIAGNOSIS — B351 Tinea unguium: Secondary | ICD-10-CM | POA: Diagnosis not present

## 2020-06-18 DIAGNOSIS — E119 Type 2 diabetes mellitus without complications: Secondary | ICD-10-CM

## 2020-06-18 DIAGNOSIS — M79675 Pain in left toe(s): Secondary | ICD-10-CM | POA: Diagnosis not present

## 2020-06-18 DIAGNOSIS — M79674 Pain in right toe(s): Secondary | ICD-10-CM | POA: Diagnosis not present

## 2020-06-18 DIAGNOSIS — E1140 Type 2 diabetes mellitus with diabetic neuropathy, unspecified: Secondary | ICD-10-CM | POA: Diagnosis not present

## 2020-06-18 DIAGNOSIS — L84 Corns and callosities: Secondary | ICD-10-CM | POA: Diagnosis not present

## 2020-06-18 NOTE — Progress Notes (Signed)
ANNUAL DIABETIC FOOT EXAM  Subjective: Troy Martinez presents today for for annual diabetic foot examination and painful thick toenails that are difficult to trim. Pain interferes with ambulation. Aggravating factors include wearing enclosed shoe gear. Pain is relieved with periodic professional debridement..  Patient relates 15 year h/o diabetes.  He relates painful callus on the plantar aspect of the right foot.  Patient denies h/o foot wounds.  Patient states he has tingling and burning of the right foot.  He has history of compartment syndrome.  He does relate numbness which is fairly new since Thanksgiving of last year and he has history of sciatica.  He states he took gabapentin years ago and it did not work.  Street, Sharon Mt, MD is patient's PCP. Last visit was 1 year ago.  Past Medical History:  Diagnosis Date  . Arthritis   . Coronary artery disease    a. DES to LCx (2008)  b. DES to RCA (2012) c. Overlapping DESx2 to large ramus (03/2014). d. [place] 03/2017 s/p cutting ballon angioplasty to ramus 2.  . GERD (gastroesophageal reflux disease)   . High cholesterol   . Hypertension   . Obesity   . Type II diabetes mellitus [facility])     Patient Active Problem List   Diagnosis Date Noted  . Dysfunction of both eustachian tubes 10/16/2017  . Angina pectoris ([place])   . Acute right-sided low back pain with right-sided sciatica 09/22/2016  . Right leg pain 09/22/2016  . Unstable angina ([place]) 04/05/2014  . Coronary artery disease involving native coronary artery of native heart with angina pectoris ([place])   . GERD (gastroesophageal reflux disease)   . Type II diabetes mellitus ([place])   . Mixed hyperlipidemia 11/27/2013  . Essential hypertension, benign 11/27/2013  . Obesity 11/27/2013    Past Surgical History:  Procedure Laterality Date  . ANTERIOR CERVICAL DECOMP/DISCECTOMY FUSION  05/2005  . BACK SURGERY    . CARDIAC CATHETERIZATION  06/2007   . CORONARY ANGIOPLASTY WITH  STENT PLACEMENT  06/2007 11/2010   DES in distal circumflex ([place]) w normal LV function 11/2010 DES to RCA-Promus  . CORONARY ANGIOPLASTY WITH STENT PLACEMENT  04/04/2014   "2; got a total of 4 now"  . CORONARY BALLOON ANGIOPLASTY Left 04/06/2017   Procedure: Coronary Balloon Angioplasty;  Surgeon: Jettie Booze, MD;  Location: [facility];  Service: Cardiovascular;  Laterality: Left;  Cutting balloon to the [place]  . DECOMPRESSION FASCIOTOMY LEG Right 2003  . LEFT HEART CATH AND CORONARY ANGIOGRAPHY N/A 04/06/2017   Procedure: Left Heart Cath and Coronary Angiography;  Surgeon: Jettie Booze, MD;  Location: [facility];  Service: Cardiovascular;  Laterality: N/A;  . LEFT HEART CATHETERIZATION WITH CORONARY ANGIOGRAM N/A 04/04/2014   Procedure: LEFT HEART CATHETERIZATION WITH CORONARY ANGIOGRAM;  Surgeon: Jettie Booze, MD;  Location[facility] CATH LAB;  Service: Cardiovascular;  Laterality: N/A;  . LEG SURGERY Right    "cut scar tissue out"  . ORIF PROXIMAL TIBIAL PLATEAU FRACTURE Right 2003  . PERCUTANEOUS STENT INTERVENTION  04/04/2014   Procedure: PERCUTANEOUS STENT INTERVENTION;  Surgeon: Jettie Booze, MD;  Location[facility] LAB;  Service: Cardiovascular;;  DES x 2 Ramus 2.5x8, 2.5x32   . TIBIA HARDWARE REMOVAL Right ?2004    Current Outpatient Medications on File Prior to Visit  Medication Sig Dispense Refill  . acetaminophen (TYLENOL) 500 MG tablet Take 2 tablets (1,000 mg total) by mouth daily as needed for moderate  pain or headache.    . Ascorbic Acid (VITAMIN C) 500 MG CHEW Chew 250 mg by mouth daily.    Marland Kitchen atorvastatin (LIPITOR) 80 MG tablet TAKE 1 TABLET BY MOUTH ONCE DAILY 90 tablet 3  . buPROPion (WELLBUTRIN XL) 300 MG 24 hr tablet Take 300 mg by mouth daily.    Marland Kitchen BYSTOLIC 5 MG tablet TAKE 1 TABLET BY MOUTH ONCE DAILY 90 tablet 3  . Cyanocobalamin (B-12 COMPLIANCE INJECTION) 1000 MCG/ML KIT Inject as directed every 30 (thirty) days.     .  cyclobenzaprine (FLEXERIL) 10 MG tablet     . escitalopram (LEXAPRO) 20 MG tablet Take 20 mg by mouth daily.     Marland Kitchen esomeprazole (NEXIUM) 20 MG capsule Take 20 mg by mouth daily at 12 noon.    . ezetimibe (ZETIA) 10 MG tablet Take 1 tablet (10 mg total) by mouth daily. 90 tablet 3  . Fe-Succ Ac-C-Thre Ac-B12-FA (FERREX 150 FORTE PLUS) 50-100 MG CAPS Take 1 capsule by mouth every evening.     . fenofibrate 160 MG tablet TAKE 1 TABLET BY MOUTH ONCE DAILY 90 tablet 3  . glimepiride (AMARYL) 1 MG tablet Take 0.5 mg by mouth daily with breakfast.     . ibuprofen (ADVIL) 200 MG tablet Take 800 mg by mouth every 6 (six) hours as needed for mild pain.    Marland Kitchen JANUMET XR 50-1000 MG TB24 Take 1 tablet by mouth 2 (two) times daily.   3  . levocetirizine (XYZAL) 5 MG tablet Take 5 mg by mouth every evening.     Marland Kitchen lisinopril (ZESTRIL) 10 MG tablet Take 0.5 tablets (5 mg total) by mouth daily. 45 tablet 3  . Multiple Vitamins-Minerals (MULTIVITAMIN PO) Take 1 tablet by mouth daily.    . nitroGLYCERIN (NITROSTAT) 0.4 MG SL tablet Place 1 tablet (0.4 mg total) under the tongue every 5 (five) minutes as needed for chest pain. 25 tablet 3  . ONE TOUCH ULTRA TEST test strip 1 each.     . prasugrel (EFFIENT) 10 MG TABS tablet Take 1 tablet (10 mg total) by mouth daily. 90 tablet 3  . tamsulosin (FLOMAX) 0.4 MG CAPS capsule Take 0.4 mg by mouth daily after supper.      No current facility-administered medications on file prior to visit.     No Known Allergies  Social History   Occupational History  . Not on file  Tobacco Use  . Smoking status: Former Smoker    Packs/day: 3.00    Years: 23.00    Pack years: 69.00    Types: Cigarettes    Quit date: 01/18/1999    Years since quitting: 21.4  . Smokeless tobacco: Never Used  Substance and Sexual Activity  . Alcohol use: Yes    Alcohol/week: 14.0 standard drinks    Types: 14 Shots of liquor per week    Comment: 04/04/2014 "2 shots/day on average"  . Drug use:  Yes    Types: Marijuana    Comment: "quit marijuana in 1994"  . Sexual activity: Yes    Family History  Problem Relation Age of Onset  . Stroke Mother   . Heart disease Father   . Heart attack Father   . Hypertension Father   . Heart attack Maternal Grandmother   . Heart attack Paternal Grandfather     Immunization History  Administered Date(s) Administered  . Influenza-Unspecified 06/19/2016     Objective: There were no vitals filed for this visit.  Troy Diones  Martinez is a pleasant 59 y.o. male in NAD. AAO X 3.  Vascular Examination: Capillary fill time to digits <3 seconds b/l lower extremities. Palpable pedal pulses b/l LE. Pedal hair present. Lower extremity skin temperature gradient within normal limits. No pain with calf compression b/l. No edema noted b/l lower extremities.  Dermatological Examination: Pedal skin with normal turgor, texture and tone bilaterally. No open wounds bilaterally. No interdigital macerations bilaterally. Toenails 1-5 b/l elongated, discolored, dystrophic, thickened, crumbly with subungual debris and tenderness to dorsal palpation. Hyperkeratotic lesion(s) submet head 5 right foot.  No erythema, no edema, no drainage, no flocculence.  Musculoskeletal Examination: Muscle strength noted to be 5 out of 5 bilaterally.  He does have pain on palpation submetatarsal head 5 of the right foot.  Also of varus deformity noted of the right foot.  Footwear Assessment: Does the patient wear appropriate shoes? Yes. Does the patient need inserts/orthotics? No.  Neurological Examination: Pt has subjective symptoms of neuropathy. Protective sensation intact 5/5 intact bilaterally with 10g monofilament b/l.  Assessment: 1. Pain due to onychomycosis of toenails of both feet   2. Callus   3. Varus deformity of foot   4. Type 2 diabetes mellitus with diabetic neuropathy, unspecified whether long term insulin use ([place])   5. Encounter for diabetic foot exam (Register)      ADA Risk Categorization:  Low Risk :  Patient has all of the following: Intact protective sensation No prior foot ulcer  No severe deformity Pedal pulses present  Plan: -Examined patient. -Diabetic foot examination performed on today's visit. -Toenails 1-5 b/l were debrided in length and girth with sterile nail nippers and dremel without iatrogenic bleeding.  -Callus(es) submet head 5 right foot pared utilizing sterile scalpel blade without complication or incident. Total number debrided =1. -Patient to report any pedal injuries to medical professional immediately. -Patient to continue soft, supportive shoe gear daily. -Patient/POA to call should there be question/concern in the interim.  Return in about 3 months (around 09/17/2020).  Marzetta Board, DPM

## 2020-06-18 NOTE — Patient Instructions (Addendum)
Purchase Dearfoam house slippers with memory foam insoles to wear around the house. Do not walk barefoot indoors nor outdoors.   Diabetes Mellitus and Foot Care Foot care is an important part of your health, especially when you have diabetes. Diabetes may cause you to have problems because of poor blood flow (circulation) to your feet and legs, which can cause your skin to:  Become thinner and drier.  Break more easily.  Heal more slowly.  Peel and crack. You may also have nerve damage (neuropathy) in your legs and feet, causing decreased feeling in them. This means that you may not notice minor injuries to your feet that could lead to more serious problems. Noticing and addressing any potential problems early is the best way to prevent future foot problems. How to care for your feet Foot hygiene  Wash your feet daily with warm water and mild soap. Do not use hot water. Then, pat your feet and the areas between your toes until they are completely dry. Do not soak your feet as this can dry your skin.  Trim your toenails straight across. Do not dig under them or around the cuticle. File the edges of your nails with an emery board or nail file.  Apply a moisturizing lotion or petroleum jelly to the skin on your feet and to dry, brittle toenails. Use lotion that does not contain alcohol and is unscented. Do not apply lotion between your toes. Shoes and socks  Wear clean socks or stockings every day. Make sure they are not too tight. Do not wear knee-high stockings since they may decrease blood flow to your legs.  Wear shoes that fit properly and have enough cushioning. Always look in your shoes before you put them on to be sure there are no objects inside.  To break in new shoes, wear them for just a few hours a day. This prevents injuries on your feet. Wounds, scrapes, corns, and calluses  Check your feet daily for blisters, cuts, bruises, sores, and redness. If you cannot see the bottom  of your feet, use a mirror or ask someone for help.  Do not cut corns or calluses or try to remove them with medicine.  If you find a minor scrape, cut, or break in the skin on your feet, keep it and the skin around it clean and dry. You may clean these areas with mild soap and water. Do not clean the area with peroxide, alcohol, or iodine.  If you have a wound, scrape, corn, or callus on your foot, look at it several times a day to make sure it is healing and not infected. Check for: ? Redness, swelling, or pain. ? Fluid or blood. ? Warmth. ? Pus or a bad smell. General instructions  Do not cross your legs. This may decrease blood flow to your feet.  Do not use heating pads or hot water bottles on your feet. They may burn your skin. If you have lost feeling in your feet or legs, you may not know this is happening until it is too late.  Protect your feet from hot and cold by wearing shoes, such as at the beach or on hot pavement.  Schedule a complete foot exam at least once a year (annually) or more often if you have foot problems. If you have foot problems, report any cuts, sores, or bruises to your health care provider immediately. Contact a health care provider if:  You have a medical condition that increases your  risk of infection and you have any cuts, sores, or bruises on your feet.  You have an injury that is not healing.  You have redness on your legs or feet.  You feel burning or tingling in your legs or feet.  You have pain or cramps in your legs and feet.  Your legs or feet are numb.  Your feet always feel cold.  You have pain around a toenail. Get help right away if:  You have a wound, scrape, corn, or callus on your foot and: ? You have pain, swelling, or redness that gets worse. ? You have fluid or blood coming from the wound, scrape, corn, or callus. ? Your wound, scrape, corn, or callus feels warm to the touch. ? You have pus or a bad smell coming from the  wound, scrape, corn, or callus. ? You have a fever. ? You have a red line going up your leg. Summary  Check your feet every day for cuts, sores, red spots, swelling, and blisters.  Moisturize feet and legs daily.  Wear shoes that fit properly and have enough cushioning.  If you have foot problems, report any cuts, sores, or bruises to your health care provider immediately.  Schedule a complete foot exam at least once a year (annually) or more often if you have foot problems. This information is not intended to replace advice given to you by your health care provider. Make sure you discuss any questions you have with your health care provider. Document Revised: 05/29/2019 Document Reviewed: 10/07/2016 Elsevier Patient Education  2020 [employer]   Diabetic Neuropathy Diabetic neuropathy refers to nerve damage that is caused by diabetes (diabetes mellitus). Over time, people with diabetes can develop nerve damage throughout the body. There are several types of diabetic neuropathy:  Peripheral neuropathy. This is the most common type of diabetic neuropathy. It causes damage to nerves that carry signals between the spinal cord and other parts of the body (peripheral nerves). This usually affects nerves in the feet and legs first, and may eventually affect the hands and arms. The damage affects the ability to sense touch or temperature.  Autonomic neuropathy. This type causes damage to nerves that control involuntary functions (autonomic nerves). These nerves carry signals that control: ? Heartbeat. ? Body temperature. ? Blood pressure. ? Urination. ? Digestion. ? Sweating. ? Sexual function. ? Response to changing blood sugar (glucose) levels.  Focal neuropathy. This type of nerve damage affects one area of the body, such as an arm, a leg, or the face. The injury may involve one nerve or a small group of nerves. Focal neuropathy can be painful and unpredictable, and occurs most often  in older adults with diabetes. This often develops suddenly, but usually improves over time and does not cause long-term problems.  Proximal neuropathy. This type of nerve damage affects the nerves of the thighs, hips, buttocks, or legs. It causes severe pain, weakness, and muscle death (atrophy), usually in the thigh muscles. It is more common among older men and people who have type 2 diabetes. The length of recovery time may vary. What are the causes? Peripheral, autonomic, and focal neuropathies are caused by diabetes that is not well controlled with treatment. The cause of proximal neuropathy is not known, but it may be caused by inflammation related to uncontrolled blood glucose levels. What are the signs or symptoms? Peripheral neuropathy Peripheral neuropathy develops slowly over time. When the nerves of the feet and legs no longer work, you  may experience:  Burning, stabbing, or aching pain in the legs or feet.  Pain or cramping in the legs or feet.  Loss of feeling (numbness) and inability to feel pressure or pain in the feet. This can lead to: ? Thick calluses or sores on areas of constant pressure. ? Ulcers. ? Reduced ability to feel temperature changes.  Foot deformities.  Muscle weakness.  Loss of balance or coordination. Autonomic neuropathy The symptoms of autonomic neuropathy vary depending on which nerves are affected. Symptoms may include:  Problems with digestion, such as: ? Nausea or vomiting. ? Poor appetite. ? Bloating. ? Diarrhea or constipation. ? Trouble swallowing. ? Losing weight without trying to.  Problems with the heart, blood and lungs, such as: ? Dizziness, especially when standing up. ? Fainting. ? Shortness of breath. ? Irregular heartbeat.  Bladder problems, such as: ? Trouble starting or stopping urination. ? Leaking urine. ? Trouble emptying the bladder. ? Urinary tract infections (UTIs).  Problems with other body functions, such  as: ? Sweat. You may sweat too much or too little. ? Temperature. You might get hot easily. Or, you might feel cold more than usual. ? Sexual function. Men may not be able to get or maintain an erection. Women may have vaginal dryness and difficulty with arousal. Focal neuropathy Symptoms affect only one area of the body. Common symptoms include:  Numbness.  Tingling.  Burning pain.  Prickling feeling.  Very sensitive skin.  Weakness.  Inability to move (paralysis).  Muscle twitching.  Muscles getting smaller (wasting).  Poor coordination.  Double or blurred vision. Proximal neuropathy  Sudden, severe pain in the hip, thigh, or buttocks. Pain may spread from the back into the legs (sciatica).  Pain and numbness in the arms and legs.  Tingling.  Loss of bladder control or bowel control.  Weakness and wasting of thigh muscles.  Difficulty getting up from a seated position.  Abdominal swelling.  Unexplained weight loss. How is this diagnosed? Diagnosis usually involves reviewing your medical history and any symptoms you have. Diagnosis varies depending on the type of neuropathy your health care provider suspects. Peripheral neuropathy Your health care provider will check areas that are affected by your nervous system (neurologic exam), such as your reflexes, how you move, and what you can feel. You may have other tests, such as:  Blood tests.  Removal and examination of fluid that surrounds the spinal cord (lumbar puncture).  CT scan.  MRI.  A test to check the nerves that control muscles (electromyogram, EMG).  Tests of how quickly messages pass through your nerves (nerve conduction velocity tests).  Removal of a small piece of nerve to be examined under a microscope (biopsy). Autonomic neuropathy You may have tests, such as:  Tests to measure your blood pressure and heart rate. This may include monitoring you while you are safely secured to an exam  table that moves you from a lying position to an upright position (table tilt test).  Breathing tests to check your lungs.  Tests to check how food moves through the digestive system (gastric emptying tests).  Blood, sweat, or urine tests.  Ultrasound of your bladder.  Spinal fluid tests. Focal neuropathy This condition may be diagnosed with:  A neurologic exam.  CT scan.  MRI.  EMG.  Nerve conduction velocity tests. Proximal neuropathy There is no test to diagnose this type of neuropathy. You may have tests to rule out other possible causes of this type of neuropathy. Tests may  include:  X-rays of your spine and lumbar region.  Lumbar puncture.  MRI. How is this treated? The goal of treatment is to keep nerve damage from getting worse. The most important part of treatment is keeping your blood glucose level and your A1C level within your target range by following your diabetes management plan. Over time, maintaining lower blood glucose levels helps lessen symptoms. In some cases, you may need prescription pain medicine. Follow these instructions at home:  Lifestyle   Do not use any products that contain nicotine or tobacco, such as cigarettes and e-cigarettes. If you need help quitting, ask your health care provider.  Be physically active every day. Include strength training and balance exercises.  Follow a healthy meal plan.  Work with your health care provider to manage your blood pressure. General instructions  Follow your diabetes management plan as directed. ? Check your blood glucose levels as directed by your health care provider. ? Keep your blood glucose in your target range as directed by your health care provider. ? Have your A1C level checked at least two times a year, or as often as told by your health care provider.  Take over the counter and prescription medicines only as told by your health care provider. This includes insulin and diabetes  medicine.  Do not drive or use heavy machinery while taking prescription pain medicines.  Check your skin and feet every day for cuts, bruises, redness, blisters, or sores.  Keep all follow up visits as told by your health care provider. This is important. Contact a health care provider if:  You have burning, stabbing, or aching pain in your legs or feet.  You are unable to feel pressure or pain in your feet.  You develop problems with digestion, such as: ? Nausea. ? Vomiting. ? Bloating. ? Constipation. ? Diarrhea. ? Abdominal pain.  You have difficulty with urination, such as inability: ? To control when you urinate (incontinence). ? To completely empty the bladder (retention).  You have palpitations.  You feel dizzy, weak, or faint when you stand up. Get help right away if:  You cannot urinate.  You have sudden weakness or loss of coordination.  You have trouble speaking.  You have pain or pressure in your chest.  You have an irregular heart beat.  You have sudden inability to move a part of your body. Summary  Diabetic neuropathy refers to nerve damage that is caused by diabetes. It can affect nerves throughout the entire body, causing numbness and pain in the arms, legs, digestive tract, heart, and other body systems.  Keep your blood glucose level and your blood pressure in your target range, as directed by your health care provider. This can help prevent neuropathy from getting worse.  Check your skin and feet every day for cuts, bruises, redness, blisters, or sores.  Do not use any products that contain nicotine or tobacco, such as cigarettes and e-cigarettes. If you need help quitting, ask your health care provider. This information is not intended to replace advice given to you by your health care provider. Make sure you discuss any questions you have with your health care provider. Document Revised: 10/18/2017 Document Reviewed: 10/10/2016 Elsevier  Patient Education  2020 [employer].

## 2020-09-17 ENCOUNTER — Ambulatory Visit: Payer: 59 | Admitting: Podiatry

## 2020-09-30 ENCOUNTER — Other Ambulatory Visit: Payer: Self-pay

## 2020-09-30 MED ORDER — EZETIMIBE 10 MG PO TABS
10.0000 mg | ORAL_TABLET | Freq: Every day | ORAL | 2 refills | Status: DC
Start: 2020-09-30 — End: 2022-03-14

## 2020-10-01 ENCOUNTER — Telehealth: Payer: Self-pay | Admitting: Interventional Cardiology

## 2020-10-01 MED ORDER — ATORVASTATIN CALCIUM 80 MG PO TABS
80.0000 mg | ORAL_TABLET | Freq: Every day | ORAL | 2 refills | Status: DC
Start: 2020-10-01 — End: 2021-08-27

## 2020-10-01 MED ORDER — NEBIVOLOL HCL 5 MG PO TABS
5.0000 mg | ORAL_TABLET | Freq: Every day | ORAL | 2 refills | Status: DC
Start: 2020-10-01 — End: 2021-10-27

## 2020-10-01 MED ORDER — PRASUGREL HCL 10 MG PO TABS
10.0000 mg | ORAL_TABLET | Freq: Every day | ORAL | 2 refills | Status: DC
Start: 2020-10-01 — End: 2021-07-19

## 2020-10-01 MED ORDER — FENOFIBRATE 160 MG PO TABS
160.0000 mg | ORAL_TABLET | Freq: Every day | ORAL | 2 refills | Status: DC
Start: 2020-10-01 — End: 2021-08-27

## 2020-10-01 NOTE — Telephone Encounter (Signed)
*  STAT* If patient is at the pharmacy, call can be transferred to refill team.   1. Which medications need to be refilled? (please list name of each medication and dose if known) atorvastatin (LIPITOR) 80 MG tablet  fenofibrate 160 MG tablet  prasugrel (EFFIENT) 10 MG TABS tablet ezetimibe (ZETIA) 10 MG tablet BYSTOLIC 5 MG tablet 2. Which pharmacy/location (including street and city if local pharmacy) is medication to be sent to? [facility] - [place], [place] - [address]  3. Do they need a 30 day or 90 day supply? 90 day supply

## 2020-10-01 NOTE — Telephone Encounter (Signed)
Pt's medications were sent to pt's pharmacy as requested. Confirmation received.  

## 2021-01-22 ENCOUNTER — Other Ambulatory Visit: Payer: Self-pay | Admitting: Physician Assistant

## 2021-01-22 ENCOUNTER — Telehealth: Payer: Self-pay | Admitting: *Deleted

## 2021-01-22 DIAGNOSIS — K760 Fatty (change of) liver, not elsewhere classified: Secondary | ICD-10-CM

## 2021-01-22 NOTE — Telephone Encounter (Signed)
   [facility] Pre-operative Risk Assessment    Patient Name: Troy Martinez  DOB: 01/03/1961  MRN: [id number]   HEARTCARE STAFF: - Please ensure there is not already an duplicate clearance open for this procedure. - Under Visit Info/Reason for Call, type in Other and utilize the format Clearance MM/DD/YY or Clearance TBD. Do not use dashes or single digits. - If request is for dental extraction, please clarify the # of teeth to be extracted.  Request for surgical clearance:  1. What type of surgery is being performed? COLONOSCOPY   2. When is this surgery scheduled? 04/12/21   3. What type of clearance is required (medical clearance vs. Pharmacy clearance to hold med vs. Both)? BOTH  4. Are there any medications that need to be held prior to surgery and how long? EFFIENT   5. Practice name and name of physician performing surgery? [facility]; DR. Alessandra Bevels   6. What is the office phone number? [phone]   7.   What is the office fax number? [phone]  8.   Anesthesia type (None, local, MAC, general) ? PROPOFOL   Julaine Hua 01/22/2021, 5:10 PM  _________________________________________________________________   (provider comments below)

## 2021-01-22 NOTE — Telephone Encounter (Signed)
    Troy Martinez DOB:  December 07, 1960  MRN:  [id number]   Primary Cardiologist: Lance Muss, MD  Chart reviewed as part of pre-operative protocol coverage.   Patient has an upcoming visit with Dr. Eldridge Dace 03/09/21, at which time his preop status can be addressed as his colonoscopy is not scheduled until 04/12/21. I have updated the appointment notes to indicate need for preop assessment.  I will remove from the preop pool at this time.  Pre-op covering staff: - Please contact requesting surgeon's office via preferred method (i.e, phone, fax) to inform them of need for appointment prior to surgery.   Beatriz Stallion, PA-C 01/22/2021, 5:57 PM

## 2021-01-23 NOTE — Telephone Encounter (Signed)
Pt has appt with Dr. Eldridge Dace 03/09/21. Will add pre op clearance is needed at appt. Will have MD fax clearance notes once pt has been cleared. Will forward clearance notes to Dr. Eldridge Dace for upcoming appt. Will send FYI to requesting office pt has appt.

## 2021-01-24 IMAGING — CR DG LUMBAR SPINE COMPLETE 4+V
5 series · 5 of 5 positions shown · non-contrast
Comparison: MRI 06/05/2015

CLINICAL DATA: Back pain with right leg pain

EXAM:
LUMBAR SPINE - COMPLETE 4+ VIEW

[t lumbar spine ap]
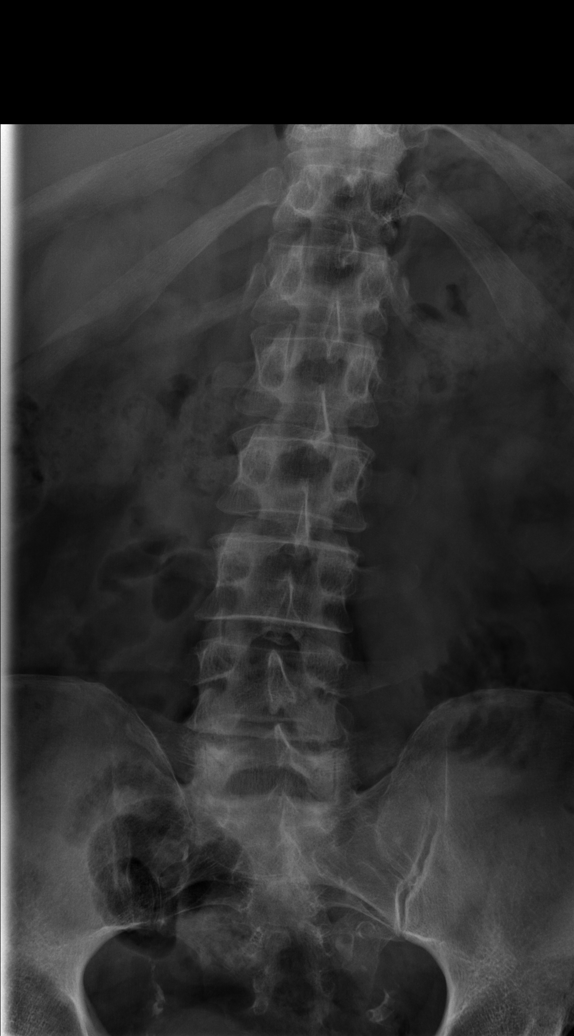

[t lumbar spine obl (1 of 2)]
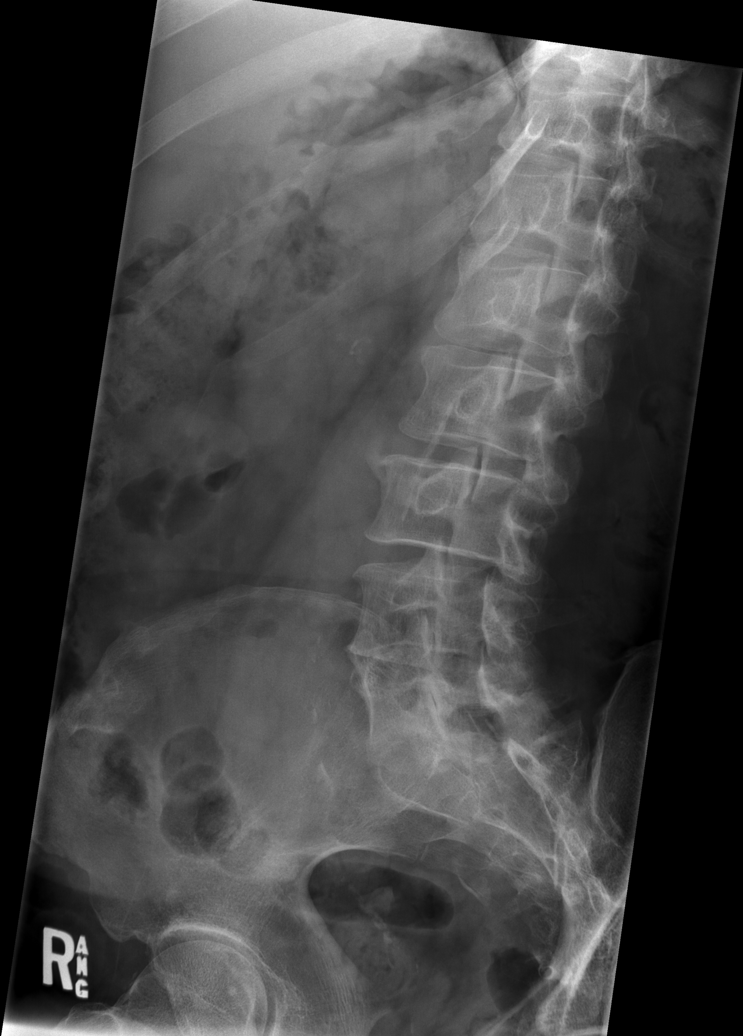

[t lumbar spine obl (2 of 2)]
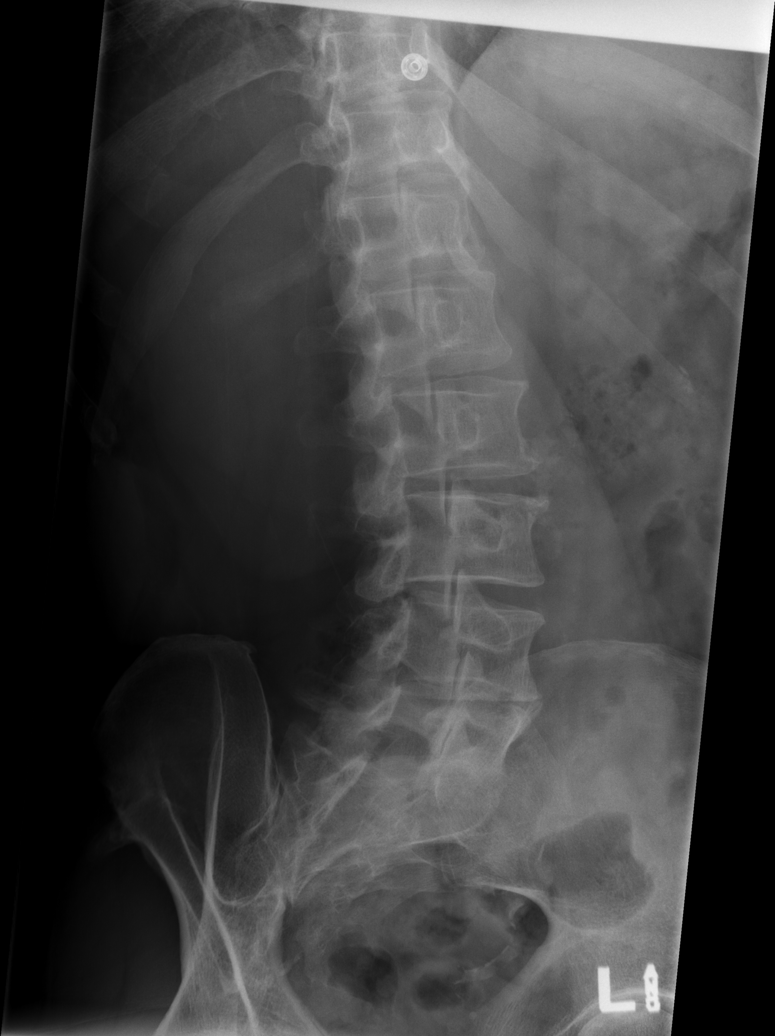

[t lumbar spine lat]
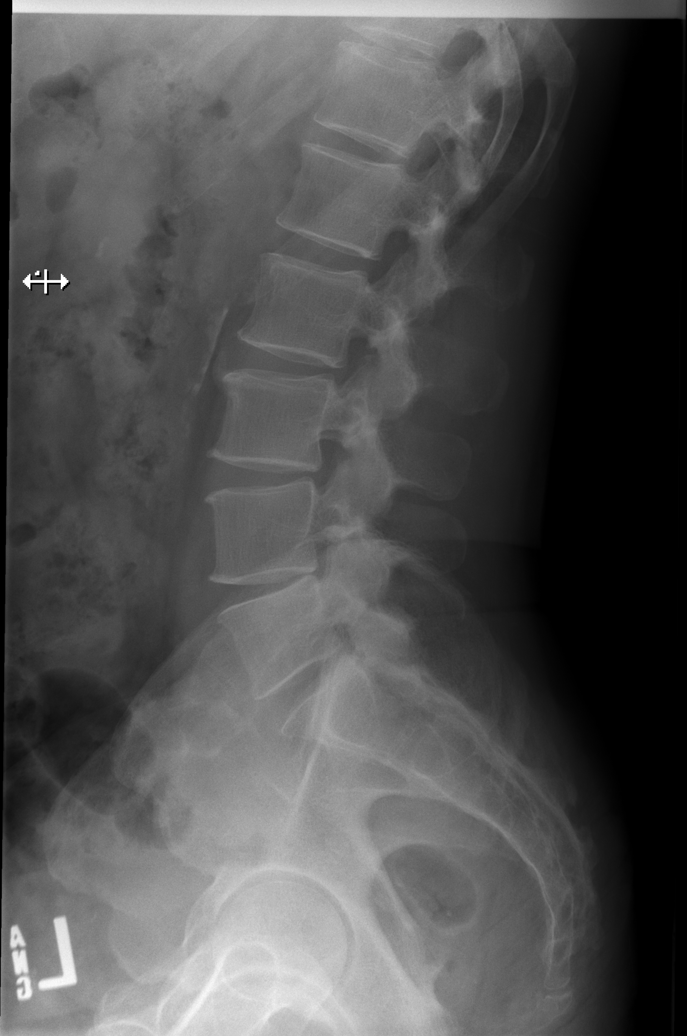

[t lumbar l-5 s-1 spot]
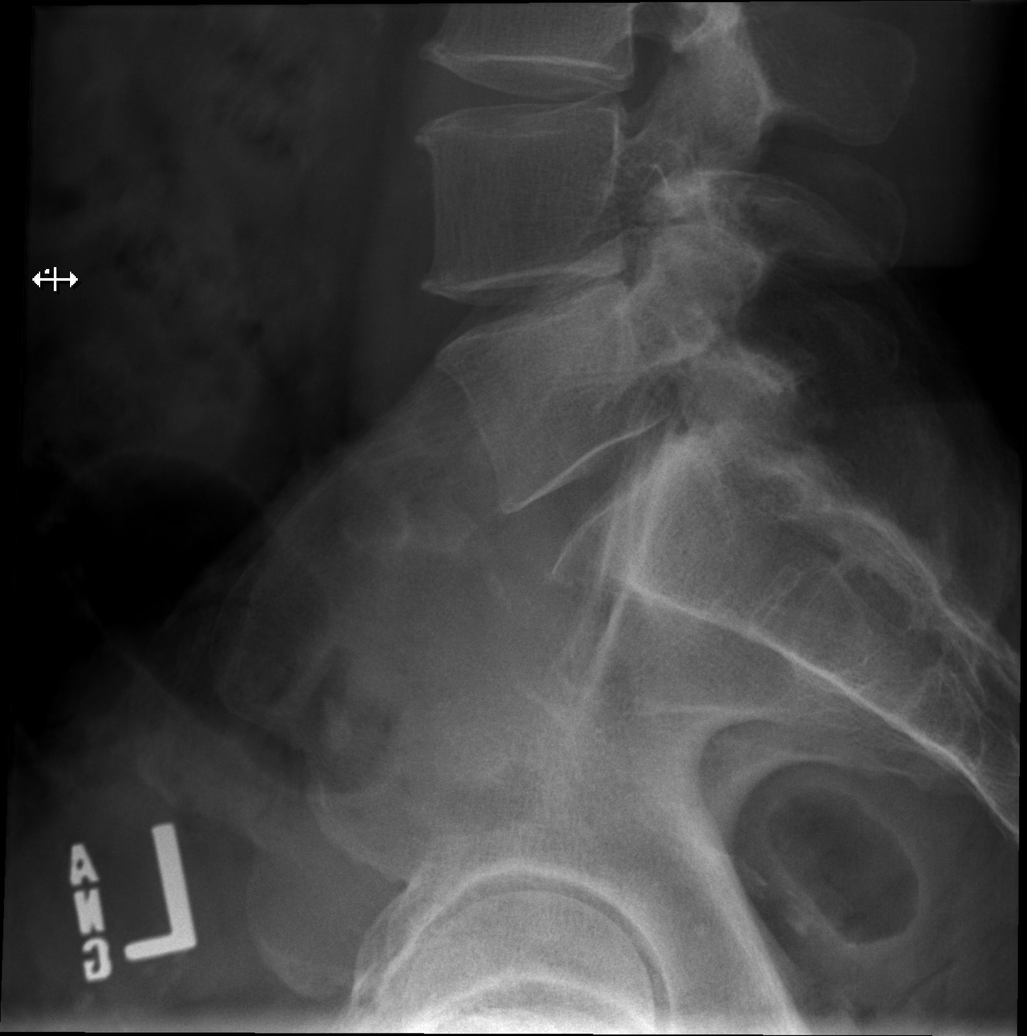

[5 of 5 positions shown; findings below may reference images not displayed]

FINDINGS: There is no evidence of lumbar spine fracture. Alignment is normal.
Mild intervertebral disc space narrowing at L4-5. The remaining
intervertebral disc spaces are relatively well maintained. Minimal
degenerative facet arthropathy of the lower lumbar spine.
IMPRESSION: Mild lower lumbar spondylosis.  No acute findings.

## 2021-02-11 ENCOUNTER — Ambulatory Visit
Admission: RE | Admit: 2021-02-11 | Discharge: 2021-02-11 | Disposition: A | Discharge status: Home / Self Care | Payer: 59 | Source: Ambulatory Visit | Attending: Physician Assistant | Admitting: Physician Assistant

## 2021-02-11 DIAGNOSIS — K760 Fatty (change of) liver, not elsewhere classified: Secondary | ICD-10-CM

## 2021-03-09 ENCOUNTER — Encounter: Payer: Self-pay | Admitting: Interventional Cardiology

## 2021-03-09 ENCOUNTER — Ambulatory Visit: Payer: 59 | Admitting: Interventional Cardiology

## 2021-03-09 ENCOUNTER — Other Ambulatory Visit: Payer: Self-pay

## 2021-03-09 VITALS — BP 130/62 | HR 66 | Ht 68.0 in | Wt 201.6 lb

## 2021-03-09 DIAGNOSIS — I10 Essential (primary) hypertension: Secondary | ICD-10-CM | POA: Diagnosis not present

## 2021-03-09 DIAGNOSIS — I25119 Atherosclerotic heart disease of native coronary artery with unspecified angina pectoris: Secondary | ICD-10-CM

## 2021-03-09 DIAGNOSIS — E782 Mixed hyperlipidemia: Secondary | ICD-10-CM | POA: Diagnosis not present

## 2021-03-09 DIAGNOSIS — Z01810 Encounter for preprocedural cardiovascular examination: Secondary | ICD-10-CM | POA: Diagnosis not present

## 2021-03-09 DIAGNOSIS — E1159 Type 2 diabetes mellitus with other circulatory complications: Secondary | ICD-10-CM

## 2021-03-09 NOTE — Patient Instructions (Signed)
Medication Instructions:  Your physician recommends that you continue on your current medications as directed. Please refer to the Current Medication list given to you today.  *If you need a refill on your cardiac medications before your next appointment, please call your pharmacy*   Lab Work: none If you have labs (blood work) drawn today and your tests are completely normal, you will receive your results only by: MyChart Message (if you have MyChart) OR A paper copy in the mail If you have any lab test that is abnormal or we need to change your treatment, we will call you to review the results.   Testing/Procedures: none   Follow-Up: At [facility], you and your health needs are our priority.  As part of our continuing mission to provide you with exceptional heart care, we have created designated Provider Care Teams.  These Care Teams include your primary Cardiologist (physician) and Advanced Practice Providers (APPs -  Physician Assistants and Nurse Practitioners) who all work together to provide you with the care you need, when you need it.  We recommend signing up for the patient portal called "MyChart".  Sign up information is provided on this After Visit Summary.  MyChart is used to connect with patients for Virtual Visits (Telemedicine).  Patients are able to view lab/test results, encounter notes, upcoming appointments, etc.  Non-urgent messages can be sent to your provider as well.   To learn more about what you can do with MyChart, go to [email or web address].    Your next appointment:   6 month(s)  The format for your next appointment:   In Person  Provider:   You may see Lance Muss, MD or one of the following Advanced Practice Providers on your designated Care Team:   Ronie Spies, PA-C Jacolyn Reedy, PA-C   Other Instructions May hold Effient for 5 days prior to colonoscopy.  Resume as directed by physician doing procedure

## 2021-03-09 NOTE — Progress Notes (Signed)
Cardiology Office Note   Date:  03/09/2021   ID:  Troy Martinez, DOB 1961-04-23, MRN [id number]  PCP:  Street, Sharon Mt, MD    No chief complaint on file.  CAD  Wt Readings from Last 3 Encounters:  03/09/21 201 lb 9.6 oz (91.4 kg)  06/09/20 200 lb (90.7 kg)  09/04/19 198 lb (89.8 kg)       History of Present Illness: Troy Martinez is a 60 y.o. male  with history of CAD (s/p prior DES to LCX 2008, DES to RCA 2012, DESx2 to ramus 03/2014, recent PTCA to ramus 2 03/2017), GERD, HTN, HLD, obesity, arthritis, DM2.   Cath in 7/18 showed: Patent RCA stent. Patent circuflex stent. Mid LAD lesion, 25 %stenosed. Ramus-2 lesion, 70 %stenosed, focal area of instent restenosis. The was treated with a 3.0 Wolverine cutting balloon, inflated to 3.25 mm. Post intervention, there is a 0% residual stenosis. The left ventricular systolic function is normal. LV end diastolic pressure is normal. The left ventricular ejection fraction is 55-65% by visual estimate. There is no aortic valve stenosis. Dist Cx lesion, 75 %stenosed. Unchanged from prior in small vessel.     He has had chronic knee issues requiring injections in the past.   He did get his COVID vaccines.     He has cut back on his alcohol use which was daily.  He has gained weight since then.  They were living in a camper during much of 2020 and early 2021.  His diet and exercise suffered.  Denies : exertional Chest pain. Dizziness. Leg edema. Nitroglycerin use. Orthopnea. Palpitations. Paroxysmal nocturnal dyspnea. Shortness of breath. Syncope.     In 12/2018 , he had low back surgery for a disc problem.   He is scheduled for colonoscopy in July 2022.  Here for clearance.  The heat has limited his walking.  He has gained some weight since the last.    Past Medical History:  Diagnosis Date   Arthritis    Coronary artery disease    a. DES to LCx (2008)  b. DES to RCA (2012) c. Overlapping DESx2 to large ramus  (03/2014). d. [place] 03/2017 s/p cutting ballon angioplasty to ramus 2.   GERD (gastroesophageal reflux disease)    High cholesterol    Hypertension    Obesity    Type II diabetes mellitus [facility])     Past Surgical History:  Procedure Laterality Date   ANTERIOR CERVICAL DECOMP/DISCECTOMY FUSION  05/2005   BACK SURGERY     CARDIAC CATHETERIZATION  06/2007    CORONARY ANGIOPLASTY WITH STENT PLACEMENT  06/2007 11/2010   DES in distal circumflex (Endeavor) w normal LV function 11/2010 DES to RCA-Promus   CORONARY ANGIOPLASTY WITH STENT PLACEMENT  04/04/2014   "2; got a total of 4 now"   CORONARY BALLOON ANGIOPLASTY Left 04/06/2017   Procedure: Coronary Balloon Angioplasty;  Surgeon: Jettie Booze, MD;  Location: [place] [facility];  Service: Cardiovascular;  Laterality: Left;  Cutting balloon to the Mid  Ramus   DECOMPRESSION FASCIOTOMY LEG Right 2003   LEFT HEART CATH AND CORONARY ANGIOGRAPHY N/A 04/06/2017   Procedure: Left Heart Cath and Coronary Angiography;  Surgeon: Jettie Booze, MD;  Location: [place] [facility];  Service: Cardiovascular;  Laterality: N/A;   LEFT HEART CATHETERIZATION WITH CORONARY ANGIOGRAM N/A 04/04/2014   Procedure: LEFT HEART CATHETERIZATION WITH CORONARY ANGIOGRAM;  Surgeon: Jettie Booze, MD;  Location: [facility] CATH LAB;  Service: Cardiovascular;  Laterality: N/A;   LEG SURGERY Right    "cut scar tissue out"   ORIF PROXIMAL TIBIAL PLATEAU FRACTURE Right 2003   PERCUTANEOUS STENT INTERVENTION  04/04/2014   Procedure: PERCUTANEOUS STENT INTERVENTION;  Surgeon: Jettie Booze, MD;  Location: [facility] CATH LAB;  Service: Cardiovascular;;  DES x 2 Ramus 2.5x8, 2.5x32    TIBIA HARDWARE REMOVAL Right ?2004     Current Outpatient Medications  Medication Sig Dispense Refill   acetaminophen (TYLENOL) 500 MG tablet Take 2 tablets (1,000 mg total) by mouth daily as needed for moderate pain or headache.     albuterol (VENTOLIN HFA) 108 (90 Base) MCG/ACT inhaler  albuterol sulfate HFA 90 mcg/actuation aerosol inhaler     Ascorbic Acid (VITAMIN C) 500 MG CHEW Chew 250 mg by mouth daily.     atorvastatin (LIPITOR) 80 MG tablet Take 1 tablet (80 mg total) by mouth daily. 90 tablet 2   buPROPion (WELLBUTRIN XL) 300 MG 24 hr tablet Take 300 mg by mouth daily.     Cyanocobalamin 1000 MCG/ML KIT Inject as directed every 30 (thirty) days.      escitalopram (LEXAPRO) 20 MG tablet Take 20 mg by mouth daily.      esomeprazole (NEXIUM) 20 MG capsule Take 20 mg by mouth daily at 12 noon.     ezetimibe (ZETIA) 10 MG tablet Take 1 tablet (10 mg total) by mouth daily. 90 tablet 2   Fe-Succ Ac-C-Thre Ac-B12-FA (FERREX 150 FORTE PLUS) 50-100 MG CAPS Take 1 capsule by mouth every evening.      fenofibrate 160 MG tablet Take 1 tablet (160 mg total) by mouth daily. 90 tablet 2   glimepiride (AMARYL) 1 MG tablet Take 0.5 mg by mouth daily with breakfast.      ibuprofen (ADVIL) 200 MG tablet Take 800 mg by mouth every 6 (six) hours as needed for mild pain.     JANUMET XR 50-1000 MG TB24 Take 1 tablet by mouth 2 (two) times daily.   3   levocetirizine (XYZAL) 5 MG tablet Take 5 mg by mouth every evening.     lisinopril (ZESTRIL) 10 MG tablet Take 0.5 tablets (5 mg total) by mouth daily. 45 tablet 3   Multiple Vitamins-Minerals (MULTIVITAMIN PO) Take 1 tablet by mouth daily.     nebivolol (BYSTOLIC) 5 MG tablet Take 1 tablet (5 mg total) by mouth daily. 90 tablet 2   nitroGLYCERIN (NITROSTAT) 0.4 MG SL tablet Place 1 tablet (0.4 mg total) under the tongue every 5 (five) minutes as needed for chest pain. 25 tablet 3   ONE TOUCH ULTRA TEST test strip 1 each.      Polysacchar Iron-FA-B12 (FERREX 150 FORTE) 150-1-25 MG-MG-MCG CAPS Ferrex 150 Forte 150 mg-25 mcg-1 mg capsule  TAKE 1 CAPSULE BY MOUTH EVERY DAY     prasugrel (EFFIENT) 10 MG TABS tablet Take 1 tablet (10 mg total) by mouth daily. 90 tablet 2   tamsulosin (FLOMAX) 0.4 MG CAPS capsule Take 0.4 mg by mouth daily after  supper.      No current facility-administered medications for this visit.    Allergies:   Patient has no known allergies.    Social History:  The patient  reports that he quit smoking about 22 years ago. His smoking use included cigarettes. He has a 69.00 pack-year smoking history. He has never used smokeless tobacco. He reports current alcohol use of about 14.0 standard drinks of alcohol per week. He reports current drug use. Drug: Marijuana.  Family History:  The patient's family history includes Heart attack in his father, maternal grandmother, and paternal grandfather; Heart disease in his father; Hypertension in his father; Stroke in his mother.    ROS:  Please see the history of present illness.   Otherwise, review of systems are positive for weight gain.   All other systems are reviewed and negative.    PHYSICAL EXAM: VS:  BP 130/62   Pulse 66   Ht _0  (1.727 m)   Wt 201 lb 9.6 oz (91.4 kg)   SpO2 97%   BMI 30.65 kg/m  , BMI Body mass index is 30.65 kg/m. GEN: Well nourished, well developed, in no acute distress HEENT: normal Neck: no JVD, carotid bruits, or masses Cardiac: RRR; no murmurs, rubs, or gallops,no edema  Respiratory:  clear to auscultation bilaterally, normal work of breathing GI: soft, nontender, nondistended, + BS MS: no deformity or atrophy Skin: warm and dry, no rash Neuro:  Strength and sensation are intact Psych: euthymic mood, full affect   EKG:   The ekg ordered today demonstrates NSR, no ST changes   Recent Labs: No results found for requested labs within last 8760 hours.   Lipid Panel    Component Value Date/Time   CHOL 142 09/04/2019 0854   TRIG 340 (H) 09/04/2019 0854   HDL 33 (L) 09/04/2019 0854   CHOLHDL 4.3 09/04/2019 0854   LDLCALC 57 09/04/2019 0854     Other studies Reviewed: Additional studies/ records that were reviewed today with results demonstrating: labs reviewed.   ASSESSMENT AND PLAN:  CAD: No typical angina.   Continue medical therapy.   Hyperlipidemia: LDL 57 in 2020. COntinue statin.  HTN: The current medical regimen is effective;  continue present plan and medications. DM: A1C 7.4. High fiber diet.  Whole food, plant based diet.  Preoperative eval.  Ok for colonoscopy.  Can hold EFfient for 5 days.    Current medicines are reviewed at length with the patient today.  The patient concerns regarding his medicines were addressed.  The following changes have been made:  No change  Labs/ tests ordered today include:  No orders of the defined types were placed in this encounter.   Recommend 150 minutes/week of aerobic exercise Low fat, low carb, high fiber diet recommended  Disposition:   FU in 6 months   Signed, Larae Grooms, MD  03/09/2021 2:16 PM    [facility] [address], [place], [place]  [address] Phone: [phone]; Fax: [phone]

## 2021-03-11 ENCOUNTER — Telehealth: Payer: Self-pay | Admitting: *Deleted

## 2021-03-11 NOTE — Telephone Encounter (Signed)
Left message patient does not need to attend any appointment 03/12/21. Appointment was made in error. Patient saw Dr Eldridge Dace on 03/09/21. Any question may call back. Appointment cancelled for 6/24/222 with Dr Herbie Baltimore.

## 2021-03-12 ENCOUNTER — Ambulatory Visit: Payer: 59 | Admitting: Cardiology

## 2021-07-07 ENCOUNTER — Other Ambulatory Visit: Payer: Self-pay | Admitting: Interventional Cardiology

## 2021-07-17 ENCOUNTER — Other Ambulatory Visit: Payer: Self-pay | Admitting: Interventional Cardiology

## 2021-08-27 ENCOUNTER — Other Ambulatory Visit: Payer: Self-pay | Admitting: Interventional Cardiology

## 2021-10-27 ENCOUNTER — Other Ambulatory Visit: Payer: Self-pay | Admitting: Interventional Cardiology

## 2021-12-02 NOTE — Progress Notes (Deleted)
?  ?Cardiology Office Note ? ? ?Date:  12/02/2021  ? ?ID:  Troy Martinez, DOB Oct 09, 1960, MRN [id number] ? ?PCP:  Street, Sharon Mt, MD  ? ? ?No chief complaint on file. ? ?CAD ? ?Wt Readings from Last 3 Encounters:  ?03/09/21 201 lb 9.6 oz (91.4 kg)  ?06/09/20 200 lb (90.7 kg)  ?09/04/19 198 lb (89.8 kg)  ?  ? ?  ?History of Present Illness: ?Troy Martinez is a 61 y.o. male  with history of CAD (s/p prior DES to LCX 2008, DES to RCA 2012, DESx2 to ramus 03/2014, recent PTCA to ramus 2 03/2017), GERD, HTN, HLD, obesity, arthritis, DM2. ?  ?Cath in 7/18 showed: ?Patent RCA stent. ?Patent circuflex stent. ?Mid LAD lesion, 25 %stenosed. ?Ramus-2 lesion, 70 %stenosed, focal area of instent restenosis. The was treated with a 3.0 Wolverine cutting balloon, inflated to 3.25 mm. ?Post intervention, there is a 0% residual stenosis. ?The left ventricular systolic function is normal. ?LV end diastolic pressure is normal. ?The left ventricular ejection fraction is 55-65% by visual estimate. ?There is no aortic valve stenosis. ?Dist Cx lesion, 75 %stenosed. Unchanged from prior in small vessel. ?  ?  ?He has had chronic knee issues requiring injections in the past. ?  ?He did get his COVID vaccines.   ?  ?In 2020, he cut back on his alcohol use which was daily.  He has gained weight since then.  They were living in a camper during much of 2020 and early 2021.  His diet and exercise suffered. ? ?  ?In 12/2018 , he had low back surgery for a disc problem.   ?He had a colonoscopy in July 2022.   ? ? ? ?Past Medical History:  ?Diagnosis Date  ? Arthritis   ? Coronary artery disease   ? a. DES to LCx (2008)  b. DES to RCA (2012) c. Overlapping DESx2 to large ramus (03/2014). d. [place] 03/2017 s/p cutting ballon angioplasty to ramus 2.  ? GERD (gastroesophageal reflux disease)   ? High cholesterol   ? Hypertension   ? Obesity   ? Type II diabetes mellitus ([place])   ? ? ?Past Surgical History:  ?Procedure Laterality Date  ? ANTERIOR  CERVICAL DECOMP/DISCECTOMY FUSION  05/2005  ? BACK SURGERY    ? CARDIAC CATHETERIZATION  06/2007   ? CORONARY ANGIOPLASTY WITH STENT PLACEMENT  06/2007 11/2010  ? DES in distal circumflex (Endeavor) w normal LV function 11/2010 DES to RCA-Promus  ? CORONARY ANGIOPLASTY WITH STENT PLACEMENT  04/04/2014  ? "2; got a total of 4 now"  ? CORONARY BALLOON ANGIOPLASTY Left 04/06/2017  ? Procedure: Coronary Balloon Angioplasty;  Surgeon: Jettie Booze, MD;  Location: [place] [facility];  Service: Cardiovascular;  Laterality: Left;  Cutting balloon to the Mid  Ramus  ? DECOMPRESSION FASCIOTOMY LEG Right 2003  ? LEFT HEART CATH AND CORONARY ANGIOGRAPHY N/A 04/06/2017  ? Procedure: Left Heart Cath and Coronary Angiography;  Surgeon: Jettie Booze, MD;  Location: [place] [facility];  Service: Cardiovascular;  Laterality: N/A;  ? LEFT HEART CATHETERIZATION WITH CORONARY ANGIOGRAM N/A 04/04/2014  ? Procedure: LEFT HEART CATHETERIZATION WITH CORONARY ANGIOGRAM;  Surgeon: Jettie Booze, MD;  Location: [facility] CATH LAB;  Service: Cardiovascular;  Laterality: N/A;  ? LEG SURGERY Right   ? "cut scar tissue out"  ? ORIF PROXIMAL TIBIAL PLATEAU FRACTURE Right 2003  ? PERCUTANEOUS STENT INTERVENTION  04/04/2014  ? Procedure: PERCUTANEOUS STENT INTERVENTION;  Surgeon:  Jettie Booze, MD;  Location: [facility] CATH LAB;  Service: Cardiovascular;;  DES x 2 Ramus 2.5x8, 2.5x32 ?  ? TIBIA HARDWARE REMOVAL Right ?2004  ? ? ? ?Current Outpatient Medications  ?Medication Sig Dispense Refill  ? acetaminophen (TYLENOL) 500 MG tablet Take 2 tablets (1,000 mg total) by mouth daily as needed for moderate pain or headache.    ? albuterol (VENTOLIN HFA) 108 (90 Base) MCG/ACT inhaler albuterol sulfate HFA 90 mcg/actuation aerosol inhaler    ? Ascorbic Acid (VITAMIN C) 500 MG CHEW Chew 250 mg by mouth daily.    ? atorvastatin (LIPITOR) 80 MG tablet TAKE 1 TABLET BY MOUTH DAILY 90 tablet 1  ? buPROPion (WELLBUTRIN XL) 300 MG 24 hr tablet Take 300 mg  by mouth daily.    ? Cyanocobalamin 1000 MCG/ML KIT Inject as directed every 30 (thirty) days.     ? escitalopram (LEXAPRO) 20 MG tablet Take 20 mg by mouth daily.     ? esomeprazole (NEXIUM) 20 MG capsule Take 20 mg by mouth daily at 12 noon.    ? ezetimibe (ZETIA) 10 MG tablet Take 1 tablet (10 mg total) by mouth daily. 90 tablet 2  ? Fe-Succ Ac-C-Thre Ac-B12-FA (FERREX 150 FORTE PLUS) 50-100 MG CAPS Take 1 capsule by mouth every evening.     ? fenofibrate 160 MG tablet TAKE 1 TABLET BY MOUTH DAILY 90 tablet 1  ? glimepiride (AMARYL) 1 MG tablet Take 0.5 mg by mouth daily with breakfast.     ? ibuprofen (ADVIL) 200 MG tablet Take 800 mg by mouth every 6 (six) hours as needed for mild pain.    ? JANUMET XR 50-1000 MG TB24 Take 1 tablet by mouth 2 (two) times daily.   3  ? levocetirizine (XYZAL) 5 MG tablet Take 5 mg by mouth every evening.    ? lisinopril (ZESTRIL) 10 MG tablet TAKE 1/2 TABLET BY MOUTH DAILY AS DIRECTED 45 tablet 3  ? Multiple Vitamins-Minerals (MULTIVITAMIN PO) Take 1 tablet by mouth daily.    ? nebivolol (BYSTOLIC) 5 MG tablet TAKE 1 TABLET BY MOUTH DAILY 90 tablet 1  ? nitroGLYCERIN (NITROSTAT) 0.4 MG SL tablet Place 1 tablet (0.4 mg total) under the tongue every 5 (five) minutes as needed for chest pain. 25 tablet 3  ? ONE TOUCH ULTRA TEST test strip 1 each.     ? Polysacchar Iron-FA-B12 (FERREX 150 FORTE) 150-1-25 MG-MG-MCG CAPS Ferrex 150 Forte 150 mg-25 mcg-1 mg capsule ? TAKE 1 CAPSULE BY MOUTH EVERY DAY    ? prasugrel (EFFIENT) 10 MG TABS tablet TAKE 1 TABLET BY MOUTH DAILY 90 tablet 2  ? tamsulosin (FLOMAX) 0.4 MG CAPS capsule Take 0.4 mg by mouth daily after supper.     ? ?No current facility-administered medications for this visit.  ? ? ?Allergies:   Patient has no known allergies.  ? ? ?Social History:  The patient  reports that he quit smoking about 22 years ago. His smoking use included cigarettes. He has a 69.00 pack-year smoking history. He has never used smokeless tobacco. He  reports current alcohol use of about 14.0 standard drinks per week. He reports current drug use. Drug: Marijuana.  ? ?Family History:  The patient's ***family history includes Heart attack in his father, maternal grandmother, and paternal grandfather; Heart disease in his father; Hypertension in his father; Stroke in his mother.  ? ? ?ROS:  Please see the history of present illness.   Otherwise, review of systems are positive  for ***.   All other systems are reviewed and negative.  ? ? ?PHYSICAL EXAM: ?VS:  There were no vitals taken for this visit. , BMI There is no height or weight on file to calculate BMI. ?GEN: Well nourished, well developed, in no acute distress ?HEENT: normal ?Neck: no JVD, carotid bruits, or masses ?Cardiac: ***RRR; no murmurs, rubs, or gallops,no edema  ?Respiratory:  clear to auscultation bilaterally, normal work of breathing ?GI: soft, nontender, nondistended, + BS ?MS: no deformity or atrophy ?Skin: warm and dry, no rash ?Neuro:  Strength and sensation are intact ?Psych: euthymic mood, full affect ? ? ?EKG:   ?The ekg ordered today demonstrates *** ? ? ?Recent Labs: ?No results found for requested labs within last 8760 hours.  ? ?Lipid Panel ?   ?Component Value Date/Time  ? CHOL 142 09/04/2019 0854  ? TRIG 340 (H) 09/04/2019 0854  ? HDL 33 (L) 09/04/2019 0854  ? CHOLHDL 4.3 09/04/2019 0854  ? [place] 57 09/04/2019 0854  ? ?  ?Other studies Reviewed: ?Additional studies/ records that were reviewed today with results demonstrating: ***. ? ? ?ASSESSMENT AND PLAN: ? ?CAD:  ?Hyperlipidemia: ?DM2: ?HTN: ? ? ? ?Current medicines are reviewed at length with the patient today.  The patient concerns regarding his medicines were addressed. ? ?The following changes have been made:  No change*** ? ?Labs/ tests ordered today include: *** ?No orders of the defined types were placed in this encounter. ? ? ?Recommend 150 minutes/week of aerobic exercise ?Low fat, low carb, high fiber diet  recommended ? ?Disposition:   FU in *** ? ? ?Signed, ?Larae Grooms, MD  ?12/02/2021 12:46 PM    ?[facility] ?[address], [place], [place]  [address] ?Phone: [phone]; Fax: [phone]  ?

## 2021-12-03 ENCOUNTER — Ambulatory Visit: Payer: 59 | Admitting: Interventional Cardiology

## 2021-12-03 ENCOUNTER — Encounter: Payer: Self-pay | Admitting: Interventional Cardiology

## 2021-12-14 ENCOUNTER — Telehealth: Payer: Self-pay

## 2021-12-14 NOTE — Telephone Encounter (Signed)
? ?  Patient Name: Troy Martinez  ?DOB: August 09, 1961 ?MRN: [id number] ? ?Primary Cardiologist: Lance Muss, MD ? ?Chart reviewed as part of pre-operative protocol coverage.  ? ?The patient already has an upcoming appointment scheduled tomorrow 12/15/21 with Dr. Eldridge Dace at which time this clearance can be addressed in case there are any new issues addressed that would impact pre-operative recommendations. (Procedure date of 01/06/22 falls after appointment.) ? ?- I added "preop" comment to appointment notes so that provider is aware to address at time of OV, will also need to comment on holding Effient. Per office protocol, the provider seeing this patient should forward their finalized clearance decision to requesting party below. ? ?- Will fax update to requesting surgeon so they are aware and remove from preop box as separate preop APP input not necessary at this time. ? ?Laurann Montana, PA-C ?12/14/2021, 4:32 PM ? ?

## 2021-12-14 NOTE — Progress Notes (Signed)
?  ?Cardiology Office Note ? ? ?Date:  12/15/2021  ? ?ID:  Troy Martinez, DOB [birth year], MRN [id number] ? ?PCP:  Street, Stephanie Coup, MD  ? ? ?Chief Complaint  ?Patient presents with  ? Follow-up  ? ?CAD ? ?Wt Readings from Last 3 Encounters:  ?12/15/21 176 lb (79.8 kg)  ?03/09/21 201 lb 9.6 oz (91.4 kg)  ?06/09/20 200 lb (90.7 kg)  ?  ? ?  ?History of Present Illness: ?Troy Martinez is a 61 y.o. male   with history of CAD (s/p prior DES to LCX 2008, DES to RCA 2012, DESx2 to ramus 03/2014, recent PTCA to ramus 2 03/2017), GERD, HTN, HLD, obesity, arthritis, DM2. ?  ?Cath in 7/18 showed: ?Patent RCA stent. ?Patent circuflex stent. ?Mid LAD lesion, 25 %stenosed. ?Ramus-2 lesion, 70 %stenosed, focal area of instent restenosis. The was treated with a 3.0 Wolverine cutting balloon, inflated to 3.25 mm. ?Post intervention, there is a 0% residual stenosis. ?The left ventricular systolic function is normal. ?LV end diastolic pressure is normal. ?The left ventricular ejection fraction is 55-65% by visual estimate. ?There is no aortic valve stenosis. ?Dist Cx lesion, 75 %stenosed. Unchanged from prior in small vessel. ?  ?  ?He has had chronic knee issues requiring injections in the past. ?  ?He did get his COVID vaccines.   ?  ?He has cut back on his alcohol use which was daily.  He has gained weight since then.  They were living in a camper during much of 2020 and early 2021.  His diet and exercise suffered. ? ?In 12/2018 , he had low back surgery for a disc problem.   ?He is scheduled for colonoscopy in July 2022.  Found some polyps per his report. ? ?He quit alcohol and started smoking MJ in 08/2021.  He just felt better using MJ in the past so he decided he wanted to go back.   Since then, he las lost 30 lbs.  Now needs an EGD as his tastes have changed and he gets full easily. ? ?Denies : exertional Chest pain. Dizziness. Leg edema. Nitroglycerin use. Orthopnea. Palpitations. Paroxysmal nocturnal dyspnea. Shortness  of breath. Syncope.   ? ?Past Medical History:  ?Diagnosis Date  ? Arthritis   ? Coronary artery disease   ? a. DES to LCx (2008)  b. DES to RCA (2012) c. Overlapping DESx2 to large ramus (03/2014). d. [place] 03/2017 s/p cutting ballon angioplasty to ramus 2.  ? GERD (gastroesophageal reflux disease)   ? High cholesterol   ? Hypertension   ? Obesity   ? Type II diabetes mellitus (HCC)   ? ? ?Past Surgical History:  ?Procedure Laterality Date  ? ANTERIOR CERVICAL DECOMP/DISCECTOMY FUSION  05/2005  ? BACK SURGERY    ? CARDIAC CATHETERIZATION  06/2007   ? CORONARY ANGIOPLASTY WITH STENT PLACEMENT  06/2007 11/2010  ? DES in distal circumflex (Endeavor) w normal LV function 11/2010 DES to RCA-Promus  ? CORONARY ANGIOPLASTY WITH STENT PLACEMENT  04/04/2014  ? "2; got a total of 4 now"  ? CORONARY BALLOON ANGIOPLASTY Left 04/06/2017  ? Procedure: Coronary Balloon Angioplasty;  Surgeon: Corky Crafts, MD;  Location: [facility] INVASIVE CV LAB;  Service: Cardiovascular;  Laterality: Left;  Cutting balloon to the Mid  Ramus  ? DECOMPRESSION FASCIOTOMY LEG Right 2003  ? LEFT HEART CATH AND CORONARY ANGIOGRAPHY N/A 04/06/2017  ? Procedure: Left Heart Cath and Coronary Angiography;  Surgeon: Corky Crafts, MD;  Location: [facility]  INVASIVE CV LAB;  Service: Cardiovascular;  Laterality: N/A;  ? LEFT HEART CATHETERIZATION WITH CORONARY ANGIOGRAM N/A 04/04/2014  ? Procedure: LEFT HEART CATHETERIZATION WITH CORONARY ANGIOGRAM;  Surgeon: Corky Crafts, MD;  Location: [facility] CATH LAB;  Service: Cardiovascular;  Laterality: N/A;  ? LEG SURGERY Right   ? "cut scar tissue out"  ? ORIF PROXIMAL TIBIAL PLATEAU FRACTURE Right 2003  ? PERCUTANEOUS STENT INTERVENTION  04/04/2014  ? Procedure: PERCUTANEOUS STENT INTERVENTION;  Surgeon: Corky Crafts, MD;  Location: [facility] CATH LAB;  Service: Cardiovascular;;  DES x 2 Ramus 2.5x8, 2.5x32 ?  ? TIBIA HARDWARE REMOVAL Right ?2004  ? ? ? ?Current Outpatient Medications  ?Medication Sig Dispense Refill  ?  acetaminophen (TYLENOL) 500 MG tablet Take 2 tablets (1,000 mg total) by mouth daily as needed for moderate pain or headache.    ? Ascorbic Acid (VITAMIN C) 500 MG CHEW Chew 250 mg by mouth daily.    ? atorvastatin (LIPITOR) 80 MG tablet TAKE 1 TABLET BY MOUTH DAILY 90 tablet 1  ? esomeprazole (NEXIUM) 20 MG capsule Take 20 mg by mouth daily at 12 noon.    ? ezetimibe (ZETIA) 10 MG tablet Take 1 tablet (10 mg total) by mouth daily. 90 tablet 2  ? FARXIGA 10 MG TABS tablet Take 10 mg by mouth daily.    ? fenofibrate 160 MG tablet TAKE 1 TABLET BY MOUTH DAILY 90 tablet 1  ? ibuprofen (ADVIL) 200 MG tablet Take 800 mg by mouth every 6 (six) hours as needed for mild pain.    ? JANUMET XR 50-1000 MG TB24 Take 1 tablet by mouth 2 (two) times daily.   3  ? levocetirizine (XYZAL) 5 MG tablet Take 5 mg by mouth every evening.    ? lisinopril (ZESTRIL) 10 MG tablet TAKE 1/2 TABLET BY MOUTH DAILY AS DIRECTED 45 tablet 3  ? Multiple Vitamins-Minerals (MULTIVITAMIN PO) Take 1 tablet by mouth daily.    ? nitroGLYCERIN (NITROSTAT) 0.4 MG SL tablet Place 1 tablet (0.4 mg total) under the tongue every 5 (five) minutes as needed for chest pain. 25 tablet 3  ? ONE TOUCH ULTRA TEST test strip 1 each.     ? prasugrel (EFFIENT) 10 MG TABS tablet TAKE 1 TABLET BY MOUTH DAILY 90 tablet 2  ? nebivolol (BYSTOLIC) 5 MG tablet TAKE 1 TABLET BY MOUTH DAILY (Patient not taking: Reported on 12/15/2021) 90 tablet 1  ? ?No current facility-administered medications for this visit.  ? ? ?Allergies:   Patient has no known allergies.  ? ? ?Social History:  The patient  reports that he quit smoking about 22 years ago. His smoking use included cigarettes. He has a 69.00 pack-year smoking history. He has never used smokeless tobacco. He reports current alcohol use of about 14.0 standard drinks per week. He reports current drug use. Drug: Marijuana.  ? ?Family History:  The patient's family history includes Heart attack in his father, maternal grandmother,  and paternal grandfather; Heart disease in his father; Hypertension in his father; Stroke in his mother.  ? ? ?ROS:  Please see the history of present illness.   Otherwise, review of systems are positive for weight loss.   All other systems are reviewed and negative.  ? ? ?PHYSICAL EXAM: ?VS:  BP 106/62   Pulse 67   Ht 5\' 8"  (1.727 m)   Wt 176 lb (79.8 kg)   SpO2 99%   BMI 26.76 kg/m?  , BMI Body mass index  is 26.76 kg/m?. ?GEN: Well nourished, well developed, in no acute distress ?HEENT: normal ?Neck: no JVD, carotid bruits, or masses ?Cardiac: RRR; no murmurs, rubs, or gallops,no edema  ?Respiratory:  clear to auscultation bilaterally, normal work of breathing ?GI: soft, nontender, nondistended, + BS ?MS: no deformity or atrophy ?Skin: warm and dry, no rash ?Neuro:  Strength and sensation are intact ?Psych: euthymic mood, full affect ? ? ?EKG:   ?The ekg ordered today demonstrates normal ECG ? ? ?Recent Labs: ?No results found for requested labs within last 8760 hours.  ? ?Lipid Panel ?   ?Component Value Date/Time  ? CHOL 142 09/04/2019 0854  ? TRIG 340 (H) 09/04/2019 0854  ? HDL 33 (L) 09/04/2019 0854  ? CHOLHDL 4.3 09/04/2019 0854  ? LDLCALC 57 09/04/2019 0854  ? ?  ?Other studies Reviewed: ?Additional studies/ records that were reviewed today with results demonstrating: labs reviewed. ? ? ?ASSESSMENT AND PLAN: ? ?CAD: No angina on medical therapy.  No problems with long walks.  ?Hyperlipidemia: LDL 89.  Needs lipid lowering therapy.  Could switch to rosuvastatin 40 mg daily.  ?Hypertension: Controlled off of Bystolic, would just continue lisinopril 5 mg daily for now.  Continue to monitor the blood pressure at home.  If there were very low readings with the top number below 90 mmHg, then would have to stop the lisinopril as well. ?Diabetes: A1C 6.7.  Glimepiride stopped because of the weight loss.  Continue regular exercise.  He will let us know if he has any symptoms.  Whole food, plant-based diet  recommended.  Avoid processed foods. ?Weight loss: April 20 EGD to evaluate.  Okay to hold Effient 5 days prior.  No further cardiac testing needed before EGD. ? ? ?Current medicines are reviewed at length with the

## 2021-12-14 NOTE — Telephone Encounter (Signed)
? ?  Pre-operative Risk Assessment  ?  ?Patient Name: Troy Martinez  ?DOB: [birth year] ?MRN: [id number]  ? ?  ? ?Request for Surgical Clearance   ? ?Procedure:   Colonoscopy ? ?Date of Surgery:  Clearance 01/06/22                              ?   ?Surgeon:  Dr. Alessandra Bevels  ?Surgeon's Group or Practice Name:  [facility] ?Phone number:  [phone] ?Fax number:  [phone] ?  ?Type of Clearance Requested:   ?- Medical  ?- Pharmacy:  Hold Prasugrel (Effient) Patient is taking currently ?  ?Type of Anesthesia:   Propofol ?  ?Additional requests/questions:   ? ?Signed, ?Jeanie Mccard   ?12/14/2021, 3:27 PM  ? ?

## 2021-12-15 ENCOUNTER — Encounter: Payer: Self-pay | Admitting: Interventional Cardiology

## 2021-12-15 ENCOUNTER — Other Ambulatory Visit: Payer: Self-pay

## 2021-12-15 ENCOUNTER — Ambulatory Visit: Payer: 59 | Admitting: Interventional Cardiology

## 2021-12-15 DIAGNOSIS — I25119 Atherosclerotic heart disease of native coronary artery with unspecified angina pectoris: Secondary | ICD-10-CM | POA: Diagnosis not present

## 2021-12-15 DIAGNOSIS — I10 Essential (primary) hypertension: Secondary | ICD-10-CM | POA: Diagnosis not present

## 2021-12-15 DIAGNOSIS — E1159 Type 2 diabetes mellitus with other circulatory complications: Secondary | ICD-10-CM

## 2021-12-15 DIAGNOSIS — E782 Mixed hyperlipidemia: Secondary | ICD-10-CM

## 2021-12-15 MED ORDER — ROSUVASTATIN CALCIUM 40 MG PO TABS: 40.0000 mg | ORAL_TABLET | Freq: Every day | ORAL | 3 refills | Status: DC

## 2021-12-15 NOTE — Patient Instructions (Signed)
Medication Instructions:  ?Your physician has recommended you make the following change in your medication: Stop Atorvastatin. ?Start Rosuvastatin 40 mg by mouth daily ? ?*If you need a refill on your cardiac medications before your next appointment, please call your pharmacy* ? ? ?Lab Work: ?Your physician recommends that you return for lab work on February 22, 2022.  CMET and Lipids.  This will be fasting.  The lab opens at 7:15 AM ? ?If you have labs (blood work) drawn today and your tests are completely normal, you will receive your results only by: ?MyChart Message (if you have MyChart) OR ?A paper copy in the mail ?If you have any lab test that is abnormal or we need to change your treatment, we will call you to review the results. ? ? ?Testing/Procedures: ?none ? ? ?Follow-Up: ?At [facility], you and your health needs are our priority.  As part of our continuing mission to provide you with exceptional heart care, we have created designated Provider Care Teams.  These Care Teams include your primary Cardiologist (physician) and Advanced Practice Providers (APPs -  Physician Assistants and Nurse Practitioners) who all work together to provide you with the care you need, when you need it. ? ?We recommend signing up for the patient portal called "MyChart".  Sign up information is provided on this After Visit Summary.  MyChart is used to connect with patients for Virtual Visits (Telemedicine).  Patients are able to view lab/test results, encounter notes, upcoming appointments, etc.  Non-urgent messages can be sent to your provider as well.   ?To learn more about what you can do with MyChart, go to [email or web address].   ? ?Your next appointment:   ?June 17, 2022 a 10:00 ? ?The format for your next appointment:   ?In Person ? ?Provider:   ?Lance Muss, MD   ? ? ?Other Instructions ? ? ?

## 2021-12-17 NOTE — Telephone Encounter (Signed)
Office visit from 12/15/21 has been faxed to [facility] ?

## 2022-01-06 ENCOUNTER — Other Ambulatory Visit: Payer: Self-pay | Admitting: Gastroenterology

## 2022-01-06 DIAGNOSIS — R634 Abnormal weight loss: Secondary | ICD-10-CM

## 2022-01-06 DIAGNOSIS — R109 Unspecified abdominal pain: Secondary | ICD-10-CM

## 2022-02-01 ENCOUNTER — Ambulatory Visit
Admission: RE | Admit: 2022-02-01 | Discharge: 2022-02-01 | Disposition: A | Discharge status: Home / Self Care | Payer: 59 | Source: Ambulatory Visit | Attending: Gastroenterology | Admitting: Gastroenterology

## 2022-02-01 DIAGNOSIS — R109 Unspecified abdominal pain: Secondary | ICD-10-CM

## 2022-02-01 DIAGNOSIS — R634 Abnormal weight loss: Secondary | ICD-10-CM

## 2022-02-01 MED ORDER — IOPAMIDOL (ISOVUE-300) INJECTION 61%
100.0000 mL | Freq: Once | INTRAVENOUS | Status: AC | PRN
  Administered 2022-02-01: 100 mL via INTRAVENOUS

## 2022-02-22 ENCOUNTER — Other Ambulatory Visit: Payer: 59

## 2022-02-22 DIAGNOSIS — E782 Mixed hyperlipidemia: Secondary | ICD-10-CM

## 2022-02-22 DIAGNOSIS — I25119 Atherosclerotic heart disease of native coronary artery with unspecified angina pectoris: Secondary | ICD-10-CM

## 2022-02-22 DIAGNOSIS — I10 Essential (primary) hypertension: Secondary | ICD-10-CM

## 2022-02-22 LAB — COMPREHENSIVE METABOLIC PANEL
ALT: 19 IU/L (ref 0–44)
AST: 21 IU/L (ref 0–40)
Albumin/Globulin Ratio: 1.8 (ref 1.2–2.2)
Albumin: 4.6 g/dL (ref 3.8–4.9)
Alkaline Phosphatase: 72 IU/L (ref 44–121)
BUN/Creatinine Ratio: 22 (ref 10–24)
BUN: 16 mg/dL (ref 8–27)
Bilirubin Total: 0.4 mg/dL (ref 0.0–1.2)
CO2: 24 mmol/L (ref 20–29)
Calcium: 9.5 mg/dL (ref 8.6–10.2)
Chloride: 97 mmol/L (ref 96–106)
Creatinine, Ser: 0.74 mg/dL — ABNORMAL LOW (ref 0.76–1.27)
Globulin, Total: 2.5 g/dL (ref 1.5–4.5)
Glucose: 190 mg/dL — ABNORMAL HIGH (ref 70–99)
Potassium: 4.1 mmol/L (ref 3.5–5.2)
Sodium: 139 mmol/L (ref 134–144)
Total Protein: 7.1 g/dL (ref 6.0–8.5)
eGFR: 104 mL/min/{1.73_m2} (ref >59)

## 2022-02-22 LAB — LIPID PANEL
Chol/HDL Ratio: 4.6 ratio (ref 0.0–5.0)
Cholesterol, Total: 227 mg/dL — ABNORMAL HIGH (ref 100–199)
HDL: 49 mg/dL (ref >39)
LDL Chol Calc (NIH): 151 mg/dL — ABNORMAL HIGH (ref 0–99)
Triglycerides: 149 mg/dL (ref 0–149)
VLDL Cholesterol Cal: 27 mg/dL (ref 5–40)

## 2022-03-07 ENCOUNTER — Other Ambulatory Visit: Payer: Self-pay

## 2022-03-07 MED ORDER — PRASUGREL HCL 10 MG PO TABS: 10.0000 mg | ORAL_TABLET | Freq: Every day | ORAL | 0 refills | Status: DC

## 2022-03-07 MED ORDER — FENOFIBRATE 160 MG PO TABS: 160.0000 mg | ORAL_TABLET | Freq: Every day | ORAL | 0 refills | Status: DC

## 2022-03-08 ENCOUNTER — Other Ambulatory Visit: Payer: Self-pay | Admitting: *Deleted

## 2022-03-08 DIAGNOSIS — E782 Mixed hyperlipidemia: Secondary | ICD-10-CM

## 2022-03-08 NOTE — Progress Notes (Signed)
Lipid clinic referral 

## 2022-03-14 ENCOUNTER — Ambulatory Visit: Payer: 59 | Admitting: Pharmacist

## 2022-03-14 DIAGNOSIS — I25119 Atherosclerotic heart disease of native coronary artery with unspecified angina pectoris: Secondary | ICD-10-CM

## 2022-03-14 DIAGNOSIS — E782 Mixed hyperlipidemia: Secondary | ICD-10-CM

## 2022-03-14 MED ORDER — NITROGLYCERIN 0.4 MG SL SUBL: 0.4000 mg | SUBLINGUAL_TABLET | SUBLINGUAL | 3 refills | Status: DC | PRN

## 2022-03-14 MED ORDER — ROSUVASTATIN CALCIUM 40 MG PO TABS: 40.0000 mg | ORAL_TABLET | Freq: Every day | ORAL | 3 refills | Status: DC

## 2022-03-14 MED ORDER — LISINOPRIL 5 MG PO TABS: 5.0000 mg | ORAL_TABLET | Freq: Every day | ORAL | 3 refills | Status: DC

## 2022-03-14 MED ORDER — EZETIMIBE 10 MG PO TABS: 10.0000 mg | ORAL_TABLET | Freq: Every day | ORAL | 3 refills | Status: DC

## 2022-04-27 ENCOUNTER — Other Ambulatory Visit: Payer: 59

## 2022-04-27 DIAGNOSIS — I25119 Atherosclerotic heart disease of native coronary artery with unspecified angina pectoris: Secondary | ICD-10-CM

## 2022-04-27 DIAGNOSIS — E782 Mixed hyperlipidemia: Secondary | ICD-10-CM

## 2022-04-27 LAB — HEPATIC FUNCTION PANEL
ALT: 29 IU/L (ref 0–44)
AST: 41 IU/L — ABNORMAL HIGH (ref 0–40)
Albumin: 4.8 g/dL (ref 3.8–4.9)
Alkaline Phosphatase: 60 IU/L (ref 44–121)
Bilirubin Total: 0.3 mg/dL (ref 0.0–1.2)
Bilirubin, Direct: 0.13 mg/dL (ref 0.00–0.40)
Total Protein: 6.8 g/dL (ref 6.0–8.5)

## 2022-04-27 LAB — LIPID PANEL
Chol/HDL Ratio: 2.5 ratio (ref 0.0–5.0)
Cholesterol, Total: 96 mg/dL — ABNORMAL LOW (ref 100–199)
HDL: 39 mg/dL — ABNORMAL LOW
LDL Chol Calc (NIH): 36 mg/dL (ref 0–99)
Triglycerides: 116 mg/dL (ref 0–149)
VLDL Cholesterol Cal: 21 mg/dL (ref 5–40)

## 2022-06-16 NOTE — Progress Notes (Signed)
Cardiology Office Note   Date:  06/17/2022   ID:  Troy Martinez, DOB 04-16-1961, MRN [id number]  PCP:  Street, Sharon Mt, MD    No chief complaint on file.  CAD  Wt Readings from Last 3 Encounters:  06/17/22 168 lb 6.4 oz (76.4 kg)  12/15/21 176 lb (79.8 kg)  03/09/21 201 lb 9.6 oz (91.4 kg)       History of Present Illness: Troy Martinez is a 60 y.o. male  with history of CAD (s/p prior DES to LCX 2008, DES to RCA 2012, DESx2 to ramus 03/2014, recent PTCA to ramus 2 03/2017), GERD, HTN, HLD, obesity, arthritis, DM2.   Cath in 7/18 showed: Patent RCA stent. Patent circuflex stent. Mid LAD lesion, 25 %stenosed. Ramus-2 lesion, 70 %stenosed, focal area of instent restenosis. The was treated with a 3.0 Wolverine cutting balloon, inflated to 3.25 mm. Post intervention, there is a 0% residual stenosis. The left ventricular systolic function is normal. LV end diastolic pressure is normal. The left ventricular ejection fraction is 55-65% by visual estimate. There is no aortic valve stenosis. Dist Cx lesion, 75 %stenosed. Unchanged from prior in small vessel.     He has had chronic knee issues requiring injections in the past.   He did get his COVID vaccines.     He has cut back on his alcohol use which was daily.  He has gained weight since then.  They were living in a camper during much of 2020 and early 2021.  His diet and exercise suffered.   In 12/2018 , he had low back surgery for a disc problem.   He is scheduled for colonoscopy in July 2022.  Found some polyps per his report.   He quit alcohol and started smoking MJ in 08/2021.  He just felt better using MJ in the past so he decided he wanted to go back.   Since then, he las lost 30 lbs.  Had EGD as his tastes changed and he gets full easily.  Seeing [facility].  Not eating much.     Taking [place].  He does not drink a lot of water.   Denies : Chest pain. Dizziness. Leg edema. Nitroglycerin use. Orthopnea.  Palpitations. Paroxysmal nocturnal dyspnea. Syncope.    Reports fatigue.  Past Medical History:  Diagnosis Date   Arthritis    Coronary artery disease    a. DES to LCx (2008)  b. DES to RCA (2012) c. Overlapping DESx2 to large ramus (03/2014). d. [place] 03/2017 s/p cutting ballon angioplasty to ramus 2.   GERD (gastroesophageal reflux disease)    High cholesterol    Hypertension    Obesity    Type II diabetes mellitus [facility])     Past Surgical History:  Procedure Laterality Date   ANTERIOR CERVICAL DECOMP/DISCECTOMY FUSION  05/2005   BACK SURGERY     CARDIAC CATHETERIZATION  06/2007    CORONARY ANGIOPLASTY WITH STENT PLACEMENT  06/2007 11/2010   DES in distal circumflex (Endeavor) w normal LV function 11/2010 DES to RCA-Promus   CORONARY ANGIOPLASTY WITH STENT PLACEMENT  04/04/2014   "2; got a total of 4 now"   CORONARY BALLOON ANGIOPLASTY Left 04/06/2017   Procedure: Coronary Balloon Angioplasty;  Surgeon: Jettie Booze, MD;  Location: [place] CV LAB;  Service: Cardiovascular;  Laterality: Left;  Cutting balloon to the Mid  Ramus   DECOMPRESSION FASCIOTOMY LEG Right 2003   LEFT HEART CATH AND CORONARY ANGIOGRAPHY N/A 04/06/2017  Procedure: Left Heart Cath and Coronary Angiography;  Surgeon: Corky Crafts, MD;  Location: [facility] INVASIVE CV LAB;  Service: Cardiovascular;  Laterality: N/A;   LEFT HEART CATHETERIZATION WITH CORONARY ANGIOGRAM N/A 04/04/2014   Procedure: LEFT HEART CATHETERIZATION WITH CORONARY ANGIOGRAM;  Surgeon: Corky Crafts, MD;  Location: [facility] CATH LAB;  Service: Cardiovascular;  Laterality: N/A;   LEG SURGERY Right    "cut scar tissue out"   ORIF PROXIMAL TIBIAL PLATEAU FRACTURE Right 2003   PERCUTANEOUS STENT INTERVENTION  04/04/2014   Procedure: PERCUTANEOUS STENT INTERVENTION;  Surgeon: Corky Crafts, MD;  Location: [facility] CATH LAB;  Service: Cardiovascular;;  DES x 2 Ramus 2.5x8, 2.5x32    TIBIA HARDWARE REMOVAL Right ?2004     Current Outpatient  Medications  Medication Sig Dispense Refill   acetaminophen (TYLENOL) 500 MG tablet Take 2 tablets (1,000 mg total) by mouth daily as needed for moderate pain or headache.     esomeprazole (NEXIUM) 20 MG capsule Take 20 mg by mouth daily at 12 noon.     ezetimibe (ZETIA) 10 MG tablet Take 1 tablet (10 mg total) by mouth daily. 90 tablet 3   FARXIGA 10 MG TABS tablet Take 10 mg by mouth daily.     ibuprofen (ADVIL) 200 MG tablet Take 800 mg by mouth every 6 (six) hours as needed for mild pain.     JANUMET XR 50-1000 MG TB24 Take 1 tablet by mouth 2 (two) times daily.   3   lisinopril (ZESTRIL) 5 MG tablet Take 1 tablet (5 mg total) by mouth daily. 90 tablet 3   Multiple Vitamins-Minerals (MULTIVITAMIN PO) Take 1 tablet by mouth daily.     nitroGLYCERIN (NITROSTAT) 0.4 MG SL tablet Place 1 tablet (0.4 mg total) under the tongue every 5 (five) minutes as needed for chest pain. 25 tablet 3   ONE TOUCH ULTRA TEST test strip 1 each.      prasugrel (EFFIENT) 10 MG TABS tablet Take 1 tablet (10 mg total) by mouth daily. Please keep September appointment for future refills. Thank you. 90 tablet 0   rosuvastatin (CRESTOR) 40 MG tablet Take 1 tablet (40 mg total) by mouth daily. 90 tablet 3   No current facility-administered medications for this visit.    Allergies:   Patient has no known allergies.    Social History:  The patient  reports that he quit smoking about 23 years ago. His smoking use included cigarettes. He has a 69.00 pack-year smoking history. He has never used smokeless tobacco. He reports current alcohol use of about 14.0 standard drinks of alcohol per week. He reports current drug use. Drug: Marijuana.   Family History:  The patient's family history includes Heart attack in his father, maternal grandmother, and paternal grandfather; Heart disease in his father; Hypertension in his father; Stroke in his mother.    ROS:  Please see the history of present illness.   Otherwise, review of  systems are positive for weight loss.   All other systems are reviewed and negative.    PHYSICAL EXAM: VS:  BP (!) 98/56   Pulse 76   Ht 5\' 8"  (1.727 m)   Wt 168 lb 6.4 oz (76.4 kg)   SpO2 97%   BMI 25.61 kg/m  , BMI Body mass index is 25.61 kg/m. GEN: Well nourished, well developed, in no acute distress HEENT: normal Neck: no JVD, carotid bruits, or masses Cardiac: RRR; no murmurs, rubs, or gallops,no edema  Respiratory:  clear  to auscultation bilaterally, normal work of breathing GI: soft, nontender, nondistended, + BS; small umbilical hernia MS: no deformity or atrophy Skin: warm and dry, no rash Neuro:  Strength and sensation are intact Psych: euthymic mood, full affect    Recent Labs: 02/22/2022: BUN 16; Creatinine, Ser 0.74; Potassium 4.1; Sodium 139 04/27/2022: ALT 29   Lipid Panel    Component Value Date/Time   CHOL 96 (L) 04/27/2022 0904   TRIG 116 04/27/2022 0904   HDL 39 (L) 04/27/2022 0904   CHOLHDL 2.5 04/27/2022 0904   LDLCALC 36 04/27/2022 0904     Other studies Reviewed: Additional studies/ records that were reviewed today with results demonstrating: labs reviewed.   ASSESSMENT AND PLAN:  CAD: No angina on medical therapy. No bleeding issues.  Using energy drinks on occasion.  Try to avoid energy drink use.  Given the fatigue and lack of energy to, check echocardiogram. Hyperlipidemia: Cholesterol in August 2023 total cholesterol 96 HDL 39 LDL 36 triglycerides 116 Hypertension: The current medical regimen is effective;  continue present plan and medications.  Readings at home reviewed.  Some are in the 140s-150s.  Most are controlled.  Stay well-hydrated. DM: A1c 7.7 in September 2023.  He is eating more candy of late.  He just does not have a taste for healthier foods. Weight loss: Had EGD in April 2023.  Held Effient prior to the EGD.  No clear etiology noted.   Current medicines are reviewed at length with the patient today.  The patient concerns  regarding his medicines were addressed.  The following changes have been made:  No change  Labs/ tests ordered today include:  No orders of the defined types were placed in this encounter.   Recommend 150 minutes/week of aerobic exercise Low fat, low carb, high fiber diet recommended  Disposition:   FU in 9 months   Signed, Lance Muss, MD  06/17/2022 10:39 AM    [facility] [address] [place], [place], [place]  [address] Phone: [phone]; Fax: [phone]

## 2022-06-17 ENCOUNTER — Ambulatory Visit: Payer: 59 | Attending: Interventional Cardiology | Admitting: Interventional Cardiology

## 2022-06-17 ENCOUNTER — Encounter: Payer: Self-pay | Admitting: Interventional Cardiology

## 2022-06-17 VITALS — BP 98/56 | HR 76 | Ht 68.0 in | Wt 168.4 lb

## 2022-06-17 DIAGNOSIS — I10 Essential (primary) hypertension: Secondary | ICD-10-CM

## 2022-06-17 DIAGNOSIS — E1159 Type 2 diabetes mellitus with other circulatory complications: Secondary | ICD-10-CM | POA: Diagnosis not present

## 2022-06-17 DIAGNOSIS — I25119 Atherosclerotic heart disease of native coronary artery with unspecified angina pectoris: Secondary | ICD-10-CM | POA: Diagnosis not present

## 2022-06-17 DIAGNOSIS — E782 Mixed hyperlipidemia: Secondary | ICD-10-CM

## 2022-06-17 NOTE — Patient Instructions (Addendum)
Medication Instructions:  Your physician recommends that you continue on your current medications as directed. Please refer to the Current Medication list given to you today.  *If you need a refill on your cardiac medications before your next appointment, please call your pharmacy*   Lab Work: none If you have labs (blood work) drawn today and your tests are completely normal, you will receive your results only by: [place] (if you have MyChart) OR A paper copy in the mail If you have any lab test that is abnormal or we need to change your treatment, we will call you to review the results.   Testing/Procedures: Your physician has requested that you have an echocardiogram. Echocardiography is a painless test that uses sound waves to create images of your heart. It provides your doctor with information about the size and shape of your heart and how well your heart's chambers and valves are working. This procedure takes approximately one hour. There are no restrictions for this procedure.    Follow-Up: At [facility], you and your health needs are our priority.  As part of our continuing mission to provide you with exceptional heart care, we have created designated Provider Care Teams.  These Care Teams include your primary Cardiologist (physician) and Advanced Practice Providers (APPs -  Physician Assistants and Nurse Practitioners) who all work together to provide you with the care you need, when you need it.  We recommend signing up for the patient portal called "MyChart".  Sign up information is provided on this After Visit Summary.  MyChart is used to connect with patients for Virtual Visits (Telemedicine).  Patients are able to view lab/test results, encounter notes, upcoming appointments, etc.  Non-urgent messages can be sent to your provider as well.   To learn more about what you can do with MyChart, go to [email or web address].    Your next appointment:   June  2023  The format for your next appointment:   In Person  Provider:   Larae Grooms, MD     Other Instructions    Important Information About Sugar

## 2022-06-24 ENCOUNTER — Encounter (HOSPITAL_BASED_OUTPATIENT_CLINIC_OR_DEPARTMENT_OTHER): Payer: Self-pay

## 2022-06-24 ENCOUNTER — Emergency Department (HOSPITAL_BASED_OUTPATIENT_CLINIC_OR_DEPARTMENT_OTHER)
Admission: EM | Admit: 2022-06-24 | Discharge: 2022-06-24 | Disposition: A | Discharge status: Home / Self Care | Payer: 59 | Attending: Emergency Medicine | Admitting: Emergency Medicine

## 2022-06-24 ENCOUNTER — Other Ambulatory Visit: Payer: Self-pay

## 2022-06-24 ENCOUNTER — Emergency Department (HOSPITAL_BASED_OUTPATIENT_CLINIC_OR_DEPARTMENT_OTHER): Payer: 59 | Admitting: Radiology

## 2022-06-24 DIAGNOSIS — R5383 Other fatigue: Secondary | ICD-10-CM | POA: Insufficient documentation

## 2022-06-24 DIAGNOSIS — Z20822 Contact with and (suspected) exposure to covid-19: Secondary | ICD-10-CM | POA: Insufficient documentation

## 2022-06-24 DIAGNOSIS — R0602 Shortness of breath: Secondary | ICD-10-CM | POA: Insufficient documentation

## 2022-06-24 DIAGNOSIS — F17200 Nicotine dependence, unspecified, uncomplicated: Secondary | ICD-10-CM | POA: Insufficient documentation

## 2022-06-24 DIAGNOSIS — R531 Weakness: Secondary | ICD-10-CM | POA: Insufficient documentation

## 2022-06-24 DIAGNOSIS — R42 Dizziness and giddiness: Secondary | ICD-10-CM | POA: Insufficient documentation

## 2022-06-24 LAB — COMPREHENSIVE METABOLIC PANEL
ALT: 17 U/L (ref 0–44)
AST: 19 U/L (ref 15–41)
Albumin: 4.8 g/dL (ref 3.5–5.0)
Alkaline Phosphatase: 49 U/L (ref 38–126)
Anion gap: 10 (ref 5–15)
BUN: 15 mg/dL (ref 8–23)
CO2: 29 mmol/L (ref 22–32)
Calcium: 10.2 mg/dL (ref 8.9–10.3)
Chloride: 100 mmol/L (ref 98–111)
Creatinine, Ser: 0.73 mg/dL (ref 0.61–1.24)
GFR, Estimated: >60 mL/min (ref >60)
Glucose, Bld: 177 mg/dL — ABNORMAL HIGH (ref 70–99)
Potassium: 4.5 mmol/L (ref 3.5–5.1)
Sodium: 139 mmol/L (ref 135–145)
Total Bilirubin: 0.5 mg/dL (ref 0.3–1.2)
Total Protein: 7.8 g/dL (ref 6.5–8.1)

## 2022-06-24 LAB — CBC
HCT: 44.3 % (ref 39.0–52.0)
Hemoglobin: 14.7 g/dL (ref 13.0–17.0)
MCH: 28.4 pg (ref 26.0–34.0)
MCHC: 33.2 g/dL (ref 30.0–36.0)
MCV: 85.5 fL (ref 80.0–100.0)
Platelets: 239 10*3/uL (ref 150–400)
RBC: 5.18 MIL/uL (ref 4.22–5.81)
RDW: 13.6 % (ref 11.5–15.5)
WBC: 11.4 10*3/uL — ABNORMAL HIGH (ref 4.0–10.5)
nRBC: 0 % (ref 0.0–0.2)

## 2022-06-24 LAB — URINALYSIS, ROUTINE W REFLEX MICROSCOPIC
Bilirubin Urine: NEGATIVE
Glucose, UA: >1000 mg/dL — AB
Ketones, ur: NEGATIVE mg/dL
Leukocytes,Ua: NEGATIVE
Nitrite: NEGATIVE
Protein, ur: 30 mg/dL — AB
Specific Gravity, Urine: 1.035 — ABNORMAL HIGH (ref 1.005–1.030)
pH: 5 (ref 5.0–8.0)

## 2022-06-24 LAB — RESP PANEL BY RT-PCR (FLU A&B, COVID) ARPGX2
Influenza A by PCR: NEGATIVE
Influenza B by PCR: NEGATIVE
SARS Coronavirus 2 by RT PCR: NEGATIVE

## 2022-06-24 LAB — CBG MONITORING, ED: Glucose-Capillary: 149 mg/dL — ABNORMAL HIGH (ref 70–99)

## 2022-06-24 MED ORDER — AZITHROMYCIN 250 MG PO TABS
500.0000 mg | ORAL_TABLET | Freq: Once | ORAL | Status: AC
  Administered 2022-06-24: 500 mg via ORAL
  Filled 2022-06-24: qty 2

## 2022-06-24 MED ORDER — AZITHROMYCIN 250 MG PO TABS: ORAL_TABLET | ORAL | 0 refills | Status: DC

## 2022-06-24 MED ORDER — ALBUTEROL SULFATE HFA 108 (90 BASE) MCG/ACT IN AERS
2.0000 | INHALATION_SPRAY | Freq: Once | RESPIRATORY_TRACT | Status: AC
  Administered 2022-06-24: 2 via RESPIRATORY_TRACT
  Filled 2022-06-24: qty 6.7

## 2022-06-24 MED ORDER — AEROCHAMBER PLUS FLO-VU MISC
1.0000 | Freq: Once | Status: DC
  Filled 2022-06-24: qty 1

## 2022-06-24 NOTE — ED Notes (Signed)
RT educated pt on proper use of MDI w/spacer. Pt able to perform without difficulty. Pt and family verbalize understanding of teaching. Pt informed this RT that he had quit smoking for 23 years and just recently in the past 3 months started smoking again. RT educated pt on the importance of smoking cessation, and pt agrees and states he is quitting again.

## 2022-06-24 NOTE — ED Provider Notes (Signed)
MEDCENTER [facility] EMERGENCY DEPT Provider Note   CSN: [id number] Arrival date & time: 06/24/22  1925     History Chief Complaint  Patient presents with  . Shortness of Breath  . Dizziness    HPI Troy Martinez is a 61 y.o. male presenting for shortness of breath, fatigue, lightheadedness, weakness and lethargy over the past year.  They endorses mild worsening over the past 2 weeks and 3 days of cough and congestion.  Today they attempted to get in with his primary care provider for further care and management, unfortunately they were referred to the emergency department due to lack of appointments.  Patient denies syncope or shortness of breath at this time.  Is otherwise ambulatory tolerating p.o. intake.  He recently started smoking again after 20 years without utilizing tobacco. He endorses worsening shortness of breath at work unable to get through the entire day.  Patient's recorded medical, surgical, social, medication list and allergies were reviewed in the Snapshot window as part of the initial history.   Review of Systems   Review of Systems  Constitutional:  Positive for fatigue. Negative for chills and fever.  HENT:  Negative for ear pain and sore throat.   Eyes:  Negative for pain and visual disturbance.  Respiratory:  Positive for cough and shortness of breath.   Cardiovascular:  Negative for chest pain and palpitations.  Gastrointestinal:  Negative for abdominal pain and vomiting.  Genitourinary:  Negative for dysuria and hematuria.  Musculoskeletal:  Negative for arthralgias and back pain.  Skin:  Negative for color change and rash.  Neurological:  Negative for seizures and syncope.  All other systems reviewed and are negative.   Physical Exam Updated Vital Signs BP (!) 122/91   Pulse 85   Temp 98.5 F (36.9 C)   Resp 18   Ht 5\' 8"  (1.727 m)   Wt 73.9 kg   SpO2 95%   BMI 24.78 kg/m  Physical Exam Vitals and nursing note reviewed.   Constitutional:      General: He is not in acute distress.    Appearance: He is well-developed.  HENT:     Head: Normocephalic and atraumatic.  Eyes:     Conjunctiva/sclera: Conjunctivae normal.  Cardiovascular:     Rate and Rhythm: Normal rate and regular rhythm.     Heart sounds: No murmur heard. Pulmonary:     Effort: Pulmonary effort is normal. No respiratory distress.     Breath sounds: Rhonchi present. No decreased breath sounds or wheezing.  Abdominal:     Palpations: Abdomen is soft.     Tenderness: There is no abdominal tenderness.  Musculoskeletal:        General: No swelling.     Cervical back: Neck supple.  Skin:    General: Skin is warm and dry.     Capillary Refill: Capillary refill takes less than 2 seconds.  Neurological:     Mental Status: He is alert.  Psychiatric:        Mood and Affect: Mood normal.     ED Course/ Medical Decision Making/ A&P    Procedures Procedures   Medications Ordered in ED Medications  azithromycin (ZITHROMAX) tablet 500 mg (has no administration in time range)  albuterol (VENTOLIN HFA) 108 (90 Base) MCG/ACT inhaler 2 puff (has no administration in time range)    Medical Decision Making:    Troy Martinez is a 61 y.o. male who presented to the ED today with progressive shortness of  breath over the past year with some mild progression this week detailed above.     Patient's presentation is complicated by their history of multiple comorbid medical problems, advanced age, extensive outpatient evaluation with gastroenterology, cardiology, endocrinology all of which has been nondiagnostic per the patient.  Patient placed on continuous vitals and telemetry monitoring while in ED which was reviewed periodically.   Complete initial physical exam performed, notably the patient  was hemodynamically stable in no acute distress.      Reviewed and confirmed nursing documentation for past medical history, family history, social history.     Initial Assessment:   With the patient's presentation of ongoing fatigue, most likely diagnosis is nonspecific etiology.  Patient has seen multiple subspecialty providers without clear diagnosis.  He has no focal abdominal tenderness for appendicitis or cholecystitis or small bowel obstruction, he has no focal pulmonary findings though he has diffuse Rales and trace wheezes in the upper fields and may be developing COPD as he has started smoking versus possibly atypical pneumonia given elevated white blood cell count on labs and progression with cough over the past 3 days.. Other diagnoses were considered including (but not limited to) acute pulmonary embolism,nonspecific abdominal pain syndrome of acute coronary syndrome, aortic dissection. These are considered less likely due to history of present illness and physical exam findings.  In particular, patient's syndrome is very chronic in nature and has had no acute exacerbation recently.  Slow progression over the past 2 weeks seems less consistent with pulmonary embolism especially in the setting of normal oxygen saturations in no acute respiratory distress as well as normal heart rate.  Additionally, lack of painAt any point seems less consistent with ACS or aortic dissection. This is most consistent with an acute life/limb threatening illness complicated by underlying chronic conditions.  Initial Plan:   Screening labs including CBC and Metabolic panel to evaluate for infectious or metabolic etiology of disease.  Urinalysis with reflex culture ordered to evaluate for UTI or relevant urologic/nephrologic pathology.  CXR to evaluate for structural/infectious intrathoracic pathology.  EKG to evaluate for cardiac pathology. Objective evaluation as below reviewed with plan for close reassessment  Initial Study Results:   Laboratory  All laboratory results reviewed without evidence of clinically relevant pathology.   Exceptions include: Mild  leukocytosis at 11  EKG EKG was reviewed independently. Rate, rhythm, axis, intervals all examined and without medically relevant abnormality. ST segments without concerns for elevations.    Radiology  All images reviewed independently. Agree with radiology report at this time.   DG Chest 2 View  Result Date: 06/24/2022 CLINICAL DATA:  Productive cough, shortness of breath and fatigue for 2 weeks. EXAM: CHEST - 2 VIEW COMPARISON:  07/08/2008 FINDINGS: The heart size and mediastinal contours are within normal limits. Both lungs are clear. Cervical spine fusion hardware again noted. IMPRESSION: No active cardiopulmonary disease. Electronically Signed   By: Danae Orleans M.D.   On: 06/24/2022 19:56    Final Assessment and Plan:   Ultimately, there is no emergent pathology identified this evening.  He does have trace respiratory abnormalities that may be developing atypical pneumonia.  No evidence of viral etiology on lab testing.  We will proceed with azithromycin for treatment of suspected atypical pneumonia and recommend patient follow-up primary care provider on Monday.  Given lack of any other acute symptoms at this time, patient stable for outpatient care and management with primary care provider as discussed.  Patient discharged with no further acute events.  Disposition:  Based on the above findings, I believe patient is stable for discharge.    Patient/family educated about specific return precautions for given chief complaint and symptoms.  Patient/family educated about follow-up with PCP.     Patient/family expressed understanding of return precautions and need for follow-up. Patient spoken to regarding all imaging and laboratory results and appropriate follow up for these results. All education provided in verbal form with additional information in written form. Time was allowed for answering of patient questions. Patient discharged.    Emergency Department Medication Summary:    Medications  azithromycin (ZITHROMAX) tablet 500 mg (has no administration in time range)  albuterol (VENTOLIN HFA) 108 (90 Base) MCG/ACT inhaler 2 puff (has no administration in time range)         Clinical Impression:  1. Other fatigue      Data Unavailable   Final Clinical Impression(s) / ED Diagnoses Final diagnoses:  Other fatigue    Rx / DC Orders ED Discharge Orders     None         Tretha Sciara, MD 06/24/22 2253

## 2022-06-24 NOTE — ED Triage Notes (Signed)
SHOB worse when ambulating, coughing up green sputum, dizziness and fatigue ongoing x 2 weeks.

## 2022-07-06 ENCOUNTER — Ambulatory Visit (HOSPITAL_COMMUNITY): Payer: 59 | Attending: Internal Medicine

## 2022-07-06 DIAGNOSIS — I25119 Atherosclerotic heart disease of native coronary artery with unspecified angina pectoris: Secondary | ICD-10-CM | POA: Insufficient documentation

## 2022-07-06 LAB — ECHOCARDIOGRAM COMPLETE
Area-P 1/2: 3.53 cm2
S' Lateral: 3.8 cm

## 2022-07-08 ENCOUNTER — Telehealth: Payer: Self-pay | Admitting: Interventional Cardiology

## 2022-07-08 NOTE — Telephone Encounter (Signed)
Patient was returning call for results. Please advise °

## 2022-07-08 NOTE — Telephone Encounter (Signed)
Jettie Booze, MD  07/06/2022 12:07 PM EDT     Normal LV/RV function. Normal valvular function.     The patient has been notified of the result and verbalized understanding.  All questions (if any) were answered. Michaelyn Barter, RN 07/08/2022 3:46 PM

## 2022-07-08 NOTE — Telephone Encounter (Signed)
Patient calling back.   °

## 2022-08-30 ENCOUNTER — Other Ambulatory Visit: Payer: Self-pay

## 2022-08-30 MED ORDER — PRASUGREL HCL 10 MG PO TABS: 10.0000 mg | ORAL_TABLET | Freq: Every day | ORAL | 2 refills | Status: DC

## 2022-11-29 ENCOUNTER — Ambulatory Visit: Payer: 59 | Admitting: Podiatry

## 2022-11-29 DIAGNOSIS — E119 Type 2 diabetes mellitus without complications: Secondary | ICD-10-CM | POA: Diagnosis not present

## 2022-11-29 DIAGNOSIS — L84 Corns and callosities: Secondary | ICD-10-CM

## 2022-11-29 DIAGNOSIS — E1142 Type 2 diabetes mellitus with diabetic polyneuropathy: Secondary | ICD-10-CM | POA: Diagnosis not present

## 2022-11-29 NOTE — Progress Notes (Signed)
  Subjective:  Patient ID: Troy Martinez, male    DOB: 01-Jun-1961,  MRN: [id number]  Chief Complaint  Patient presents with   Callouses    diabeitc with callus on right foot causing pain    62 y.o. male presents with the above complaint. History confirmed with patient. Patient presenting with callus lesion on the right foot causing pain. Does have pain with walking on area. Has history DM 2 with tingling in feet but denies numbness. Has custom made orthotics which he feels are helping he got from orthopedics.  Objective:  Physical Exam: warm, good capillary refill nail exam normal nails without lesions and diffuse broad hyperkeratotic quarter size lesion present at the plantar aspect of the right fifth metatarsal head DP pulses palpable, PT pulses palpable, and protective sensation intact Left Foot: No pain on palpation Right Foot: Pain with palpation of hyperkeratotic lesion subfifth metatarsal head no open wound underlying  Assessment:   1. Pre-ulcerative calluses   2. DM type 2 with diabetic peripheral neuropathy ([place])   3. Comprehensive diabetic foot examination, type 2 DM, encounter for [facility])      Plan:  Patient was evaluated and treated and all questions answered.  # DM2 with mild peripheral neuropathy Patient educated on diabetes. Discussed proper diabetic foot care and discussed risks and complications of disease. Educated patient in depth on reasons to return to the office immediately should he/she discover anything concerning or new on the feet. All questions answered. Discussed proper shoes as well.   #Hyperkeratotic lesions/pre ulcerative calluses present subfifth metatarsal head right foot All symptomatic hyperkeratoses x 1 separate lesions were safely debrided with a sterile #10 blade to patient's level of comfort without incident. We discussed preventative and palliative care of these lesions including supportive and accommodative shoegear, padding, prefabricated and  custom molded accommodative orthoses, use of a pumice stone and lotions/creams daily.   Return in about 3 months (around 03/01/2023) for [facility] callus.         Everitt Amber, DPM [facility]

## 2023-01-02 ENCOUNTER — Other Ambulatory Visit: Payer: Self-pay

## 2023-01-02 MED ORDER — EZETIMIBE 10 MG PO TABS: 10.0000 mg | ORAL_TABLET | Freq: Every day | ORAL | 1 refills | Status: DC

## 2023-02-21 ENCOUNTER — Ambulatory Visit (INDEPENDENT_AMBULATORY_CARE_PROVIDER_SITE_OTHER): Payer: 59 | Admitting: Podiatry

## 2023-02-21 DIAGNOSIS — Z91199 Patient's noncompliance with other medical treatment and regimen due to unspecified reason: Secondary | ICD-10-CM

## 2023-02-21 NOTE — Progress Notes (Signed)
Patient was no-show for appointment, CG

## 2023-03-17 ENCOUNTER — Other Ambulatory Visit: Payer: Self-pay

## 2023-03-17 MED ORDER — LISINOPRIL 5 MG PO TABS: 5.0000 mg | ORAL_TABLET | Freq: Every day | ORAL | 0 refills | Status: DC

## 2023-04-18 ENCOUNTER — Other Ambulatory Visit: Payer: Self-pay | Admitting: *Deleted

## 2023-04-18 MED ORDER — LISINOPRIL 5 MG PO TABS: 5.0000 mg | ORAL_TABLET | Freq: Every day | ORAL | 0 refills | Status: DC

## 2023-04-18 MED ORDER — NITROGLYCERIN 0.4 MG SL SUBL: 0.4000 mg | SUBLINGUAL_TABLET | SUBLINGUAL | 0 refills | Status: DC | PRN

## 2023-04-18 MED ORDER — ROSUVASTATIN CALCIUM 40 MG PO TABS: 40.0000 mg | ORAL_TABLET | Freq: Every day | ORAL | 0 refills | Status: DC

## 2023-05-25 ENCOUNTER — Encounter: Payer: Self-pay | Admitting: Internal Medicine

## 2023-05-25 ENCOUNTER — Ambulatory Visit: Payer: 59 | Admitting: Internal Medicine

## 2023-05-25 VITALS — BP 144/68 | HR 74 | Temp 97.3°F | Resp 20 | Ht 68.0 in

## 2023-05-25 DIAGNOSIS — J310 Chronic rhinitis: Secondary | ICD-10-CM

## 2023-05-25 DIAGNOSIS — T63441A Toxic effect of venom of bees, accidental (unintentional), initial encounter: Secondary | ICD-10-CM | POA: Diagnosis not present

## 2023-05-25 DIAGNOSIS — R634 Abnormal weight loss: Secondary | ICD-10-CM

## 2023-05-25 DIAGNOSIS — R630 Anorexia: Secondary | ICD-10-CM | POA: Insufficient documentation

## 2023-05-25 DIAGNOSIS — R0982 Postnasal drip: Secondary | ICD-10-CM | POA: Diagnosis not present

## 2023-05-25 NOTE — Progress Notes (Signed)
NEW PATIENT Date of Service/Encounter:  05/25/23 Referring provider: Vivianne Master, PA Primary care provider: Street, Stephanie Coup, MD  Subjective:  Troy Martinez is a 62 y.o. male with a PMHx of hyperlipidemia, HTN, obesity, CAD, GERD, DM II, angina pectoris, ETD presenting today for evaluation of post nasal drip. History obtained from: chart review and patient.   Chronic rhinitis:  started 2 years ago The Christmas before last he lost around 50-60 lbs.  Part of it came from not eating. Food did not taste well and symptoms were intermittent.  Strong flavored foods like sugary foods were fine, but other foods lost their taste.  This happened for about 10 months.  He did have covid at least twice and one of which was prior to these symptoms starting. He eventually went to ER 2 years ago after feeling chronic fatigue. He was put on erythromycin after ER provider reportedly hearing rales.  He saw his PCP and again was put on erythromycin.  After doing these 2 rounds, his symptoms resolved-sense of taste returned.  This past Christmas he had the same issue.  However, this time had loss of appetite and occasional morning vomiting. He did get several rounds of antibiotics to see if this would help, but this time it did not.  His sense of taste is off, he is losing weight and he has been told this may be secondary to allergies. Symptoms include: post nasal drainage, occasional sneezing, loss of appetite. Occurs year-round Potential triggers: unknown Treatments tried: zyrtec-helps with drainage but not appetite. He uses flonase irregularly. No other nasal sprays Previous allergy testing: no History of reflux/heartburn:  yes-taking omeprazole 20 mg daily Previous sinus, ear, tonsil, adenoid surgeries: none Previous sinus imaging: yes- see below  He has been evaluated by GI.  Nothing came of this work-up except finding a slightly enlarged prostate. I do not have these notes available for  review.  Today, he also was stung by a bee after putting his shoe on this morning and his foot and leg are hurting.    Chart Review:  Reviewed ENT notes from referral 04/24/23: PND, allergic rhinitis; refer to Allergy.  CT scan of sinuses 2024:  1. Normally aerated paranasal sinuses. Small retention cyst in the right maxillary sinus adjacent to the infundibulum, apparently nonobstructing. Narrowing of the ostium and proximal infundibulum on the right because this, but without evidence of widespread mucosal  thickening or any layering fluid.  2. Nasal septum bows 3 mm towards the left.  CT abdomen 2023: IMPRESSION: 1. No acute process identified. 2. Hepatomegaly with suggestion of hepatic steatosis. 3. Mild prostatomegaly.  Other allergy screening: Asthma: no Food allergy: no Medication allergy: no Hymenoptera allergy: no Urticaria: no Eczema:no History of recurrent infections suggestive of immunodeficency: no Vaccinations are up to date.   Past Medical History: Past Medical History:  Diagnosis Date   Arthritis    Coronary artery disease    a. DES to LCx (2008)  b. DES to RCA (2012) c. Overlapping DESx2 to large ramus (03/2014). d. [place] 03/2017 s/p cutting ballon angioplasty to ramus 2.   GERD (gastroesophageal reflux disease)    High cholesterol    Hypertension    Obesity    Type II diabetes mellitus (HCC)    Medication List:  Current Outpatient Medications  Medication Sig Dispense Refill   buPROPion (WELLBUTRIN XL) 300 MG 24 hr tablet Take 300 mg by mouth daily.     naproxen (NAPROSYN) 500 MG tablet Take  500 mg by mouth 2 (two) times daily.     acetaminophen (TYLENOL) 500 MG tablet Take 2 tablets (1,000 mg total) by mouth daily as needed for moderate pain or headache.     esomeprazole (NEXIUM) 20 MG capsule Take 20 mg by mouth daily at 12 noon.     ezetimibe (ZETIA) 10 MG tablet Take 1 tablet (10 mg total) by mouth daily. 90 tablet 1   FARXIGA 10 MG TABS tablet Take 10 mg  by mouth daily.     ibuprofen (ADVIL) 200 MG tablet Take 800 mg by mouth every 6 (six) hours as needed for mild pain.     JANUMET XR 50-1000 MG TB24 Take 1 tablet by mouth 2 (two) times daily.   3   lisinopril (ZESTRIL) 5 MG tablet Take 1 tablet (5 mg total) by mouth daily. Please call our office to schedule an yearly appointment for any future refills  [phone]. Thank you 2nd attempt 90 tablet 0   Multiple Vitamins-Minerals (MULTIVITAMIN PO) Take 1 tablet by mouth daily.     nitroGLYCERIN (NITROSTAT) 0.4 MG SL tablet Place 1 tablet (0.4 mg total) under the tongue every 5 (five) minutes as needed for chest pain. Please call our office to schedule an yearly appointment for any future refills  [phone]. Thank you 2nd attempt 25 tablet 0   prasugrel (EFFIENT) 10 MG TABS tablet Take 1 tablet (10 mg total) by mouth daily. 90 tablet 2   rosuvastatin (CRESTOR) 40 MG tablet Take 1 tablet (40 mg total) by mouth daily. Please call our office to schedule an yearly appointment for any future refills  [phone]. Thank you 2nd attempt 90 tablet 0   No current facility-administered medications for this visit.   Known Allergies:  No Known Allergies Past Surgical History: Past Surgical History:  Procedure Laterality Date   ANTERIOR CERVICAL DECOMP/DISCECTOMY FUSION  05/2005   BACK SURGERY     CARDIAC CATHETERIZATION  06/2007    CORONARY ANGIOPLASTY WITH STENT PLACEMENT  06/2007 11/2010   DES in distal circumflex ([place]) w normal LV function 11/2010 DES to RCA-Promus   CORONARY ANGIOPLASTY WITH STENT PLACEMENT  04/04/2014   "2; got a total of 4 now"   CORONARY BALLOON ANGIOPLASTY Left 04/06/2017   Procedure: Coronary Balloon Angioplasty;  Surgeon: Corky Crafts, MD;  Location: [facility] INVASIVE CV LAB;  Service: Cardiovascular;  Laterality: Left;  Cutting balloon to the Mid  [place]   DECOMPRESSION FASCIOTOMY LEG Right 2003   LEFT HEART CATH AND CORONARY ANGIOGRAPHY N/A 04/06/2017   Procedure: Left  Heart Cath and Coronary Angiography;  Surgeon: Corky Crafts, MD;  Location: [facility] INVASIVE CV LAB;  Service: Cardiovascular;  Laterality: N/A;   LEFT HEART CATHETERIZATION WITH CORONARY ANGIOGRAM N/A 04/04/2014   Procedure: LEFT HEART CATHETERIZATION WITH CORONARY ANGIOGRAM;  Surgeon: Corky Crafts, MD;  Location: [facility] CATH LAB;  Service: Cardiovascular;  Laterality: N/A;   LEG SURGERY Right    "cut scar tissue out"   ORIF PROXIMAL TIBIAL PLATEAU FRACTURE Right 2003   PERCUTANEOUS STENT INTERVENTION  04/04/2014   Procedure: PERCUTANEOUS STENT INTERVENTION;  Surgeon: Corky Crafts, MD;  Location: [facility] CATH LAB;  Service: Cardiovascular;;  DES x 2 Ramus 2.5x8, 2.5x32    TIBIA HARDWARE REMOVAL Right ?2004   Family History: Family History  Problem Relation Age of Onset   Stroke Mother    Heart disease Father    Heart attack Father    Hypertension Father    Heart attack  Maternal Grandmother    Heart attack Paternal Grandfather    Social History: Troy Martinez lives in a house built 3 years ago, no water damage, area rugs in the bedroom, heat pump heating fast, 1 indoor dog, no roaches, not using dust mite protection on the bedding or pillows, he smoked 2 PPD from 1977-2001.  Starting in 2022 he is smoking cigars and marijuana.He works as a Char[employer] fundraiser.  No HEPA filter in the home.  [place]   ROS:  All other systems negative except as noted per HPI.  Objective:  Blood pressure (!) 144/68, pulse 74, temperature (!) 97.3 F (36.3 C), temperature source Temporal, resp. rate 20, height 5\' 8"  (1.727 m), SpO2 98%. Body mass index is 24.78 kg/m. Physical Exam:  General Appearance:  Alert, cooperative, no distress, appears stated age  Head:  Normocephalic, without obvious abnormality, atraumatic  Eyes:  Conjunctiva clear, EOM's intact  Ears EACs normal bilaterally and normal TMs bilaterally  Nose: Nares normal, hypertrophic turbinates, normal  mucosa, and no visible anterior polyps  Throat: Lips, tongue normal; teeth and gums normal, normal posterior oropharynx  Neck: Supple, symmetrical  Lungs:   clear to auscultation bilaterally, Respirations unlabored, no coughing  Heart:  regular rate and rhythm and no murmur, Appears well perfused  Extremities: No edema  Skin: Minimally edematous 1st and 2nd toes on right foot and Skin color, texture, turgor normal  Neurologic: No gross deficits   Diagnostics: Skin Testing:  deferred due to insurance carrier .  Labs:  Lab Orders  No laboratory test(s) ordered today   Assessment and Plan  Chronic Rhinitis:  - allergy testing today: deferred due to insurance carrier. - Symptom control: - Start Flonase (fluticasone) 1- 2 sprays in each nostril daily.  - Continue Zyrtec (Cetirizine) 10 mg daily as needed Extensive smoking history.  Your symptoms sound concerning for long covid but this is a diagnosis of exclusion. Drainage can certainly affect your appetite, but I would not expect allergies or drainage to affect your appetite to this degree and/or lead to the amount of weight loss you have experienced.   Would continue to follow with your PCP for these issues. We will perform allergy testing at follow-up to see if drainage secondary to allergies.   Recent Sting from Insect  - no signs of allergic reaction - may take tylenol or advil every 6-8 hours as needed for pain - zyrtec 10 mg daily up to twice daily as needed for itching - ice to help with swelling  Follow up : skin testing; 1-63 SPT, IDs if needed. Must stop zyrtec 3 days prior to your visit. It was a pleasure meeting you in clinic today! Thank you for allowing me to participate in your care.  This note in its entirety was forwarded to the Provider who requested this consultation.  Other: gave advil and allegra due to recent bee sting in clinic, no signs of significant edema or signs of severe allergic reaction. Patient  does report pain of his toe.   Thank you for your kind referral. I appreciate the opportunity to take part in [facility] care. Please do not hesitate to contact me with questions.  Sincerely,  Tonny Bollman, MD [facility]

## 2023-05-25 NOTE — Patient Instructions (Addendum)
Chronic Rhinitis:  - allergy testing today: deferred due to insurance carrier. - Symptom control: - Start Flonase (fluticasone) 1- 2 sprays in each nostril daily.  - Continue Zyrtec (Cetirizine) 10 mg daily as needed  Your symptoms sound concerning for long covid but this is a diagnosis of exclusion. Drainage can certainly affect your appetite, but I would not expect allergies or drainage to affect your appetite to this degree and/or lead to the amount of weight loss you have experienced.  Would continue to follow with your PCP for these issues. We will perform allergy testing at follow-up to see if drainage secondary to allergies.   Recent Sting from Insect  - no signs of allergic reaction - may take tylenol or advil every 6-8 hours as needed for pain - zyrtec 10 mg daily up to twice daily as needed for itching - ice to help with swelling   Follow up : skin testing; 1-63 SPT, IDs if needed. Must stop zyrtec 3 days prior to your visit. It was a pleasure meeting you in clinic today! Thank you for allowing me to participate in your care.  Tonny Bollman, MD [facility]

## 2023-05-28 NOTE — Patient Instructions (Incomplete)
Chronic Rhinitis:  - allergy testing today:  - Symptom control: - Continue  Flonase (fluticasone) 1- 2 sprays in each nostril daily.  - Continue Zyrtec (Cetirizine) 10 mg daily as needed  Your symptoms sound concerning for long covid but this is a diagnosis of exclusion. Drainage can certainly affect your appetite, but I would not expect allergies or drainage to affect your appetite to this degree and/or lead to the amount of weight loss you have experienced.  Would continue to follow with your PCP for these issues.  Recent Sting from Insect  - no signs of allergic reaction - may take tylenol or advil every 6-8 hours as needed for pain - zyrtec 10 mg daily up to twice daily as needed for itching - ice to help with swelling   Follow up :

## 2023-05-29 ENCOUNTER — Ambulatory Visit: Payer: 59 | Admitting: Family

## 2023-05-29 ENCOUNTER — Encounter: Payer: Self-pay | Admitting: Family

## 2023-05-29 DIAGNOSIS — J3089 Other allergic rhinitis: Secondary | ICD-10-CM | POA: Diagnosis not present

## 2023-05-29 DIAGNOSIS — R630 Anorexia: Secondary | ICD-10-CM

## 2023-05-29 DIAGNOSIS — R0982 Postnasal drip: Secondary | ICD-10-CM

## 2023-05-29 DIAGNOSIS — R634 Abnormal weight loss: Secondary | ICD-10-CM | POA: Diagnosis not present

## 2023-05-29 DIAGNOSIS — J302 Other seasonal allergic rhinitis: Secondary | ICD-10-CM | POA: Diagnosis not present

## 2023-05-29 DIAGNOSIS — T63441A Toxic effect of venom of bees, accidental (unintentional), initial encounter: Secondary | ICD-10-CM

## 2023-05-29 NOTE — Progress Notes (Signed)
Date of Service/Encounter:  05/29/23  Allergy testing appointment   Initial visit on May 25, 2023 by Dr. Maurine Minister, seen for chronic rhinitis, postnasal drainage, toxic effect of venom of bees, appetite loss, and unexplained weight loss.  Please see that note for additional details.  Today reports for allergy diagnostic testing:    DIAGNOSTICS:  Skin Testing: Environmental allergy panel and select foods. Adequate positive and negative controls Results discussed with patient/family.  Skin prick testing to environmental allergens and select foods were negative with adequate controls.  Intradermal testing is positive to major mold mix 1, major mold mix 2, major mold mix 3, major mold mix 4, dust mite, and dog  Allergy testing results were read and interpreted by myself, documented by clinical staff.  Patient provided with copy of allergy testing along with avoidance measures when indicated.    Airborne Adult Perc - 05/29/23 0837     Time Antigen Placed 0830    Allergen Manufacturer Waynette Buttery    Location Back    Number of Test 55    1. Control-Buffer 50% Glycerol Negative    2. Control-Histamine 3+    3. Bahia Negative    4. French Southern Territories Negative    5. Johnson Negative    6. [place] Blue Negative    7. Meadow Fescue Negative    8. Perennial Rye Negative    9. Timothy Negative    10. Ragweed Mix Negative    11. Cocklebur Negative    12. Plantain,  English Negative    13. Baccharis Negative    14. Dog Fennel Negative    15. Russian Thistle Negative    16. Lamb's Quarters Negative    17. Sheep Sorrell Negative    18. Rough Pigweed Negative    19. Marsh Elder, Rough Negative    20. Mugwort, Common Negative    21. Box, Elder Negative    22. Cedar, red Negative    23. Sweet Gum Negative    24. Pecan Pollen Negative    25. Pine Mix Negative    26. Walnut, Black Pollen Negative    27. Red Mulberry Negative    28. Ash Mix Negative    29. Birch Mix Negative    30. Beech American  Negative    31. Cottonwood, Guinea-Bissau Negative    32. Hickory, White Negative    33. Maple Mix Negative    34. Oak, Guinea-Bissau Mix Negative    35. Sycamore Eastern Negative    36. Alternaria Alternata Negative    37. Cladosporium Herbarum Negative    38. Aspergillus Mix Negative    39. Penicillium Mix Negative    40. Bipolaris Sorokiniana (Helminthosporium) Negative    41. Drechslera Spicifera (Curvularia) Negative    42. Mucor Plumbeus Negative    43. Fusarium Moniliforme Negative    44. Aureobasidium Pullulans (pullulara) Negative    45. Rhizopus Oryzae Negative    46. Botrytis Cinera Negative    47. Epicoccum Nigrum Negative    48. Phoma Betae Negative    49. Dust Mite Mix Negative    50. Cat Hair 10,000 BAU/ml Negative    51.  Dog Epithelia Negative    52. Mixed Feathers Negative    53. Horse Epithelia Negative    54. Cockroach, German Negative    55. Tobacco Leaf Negative             13 Food Perc - 05/29/23 0838       Test Information   Time Antigen  Placed 0830    Allergen Manufacturer Greer    Location Back    Number of allergen test 13      Food   1. Peanut Negative    2. Soybean Negative    3. Wheat Negative    4. Sesame Negative    5. Milk, Cow Negative    6. Casein Negative    7. Egg White, Chicken Negative    8. Shellfish Mix Negative    9. Fish Mix Negative    10. Cashew Negative    11. Walnut Food Negative    12. Almond Negative    13. Hazelnut Negative             Intradermal - 05/29/23 0923     Time Antigen Placed 0915    Location Arm    Number of Test 16    Intradermal Select    Control 3+    Bahia Negative    French Southern Territories Negative    Johnson Negative    7 Grass Negative    Ragweed Mix Negative    Weed Mix Negative    Tree Mix Negative    Mold 1 3+    Mold 2 3+    Mold 3 3+    Mold 4 3+    Mite Mix 3+    Cat Negative    Dog 3+    Cockroach Negative             Allergic rhinitis:  -Skin prick testing today is negative  with adequate controls.  Intradermal skin testing today is positive to major mold mix 1, major mold mix 2, major mold mix 3, major mold mix 4, dust mite, and dog -Copy of skin test given -Start avoidance measures as below - Symptom control: - Start Flonase (fluticasone) 1- 2 sprays in each nostril daily. In the right nostril, point the applicator out toward the right ear. In the left nostril, point the applicator out toward the left ear - Continue Zyrtec (Cetirizine) 10 mg daily as needed  Consider allergy injections as a means of long-term control. - Allergy injections "re-train" and "reset" the immune system to ignore environmental allergens and decrease the resulting immune response to those allergens (sneezing, itchy watery eyes, runny nose, nasal congestion, etc).    - Allergy injections improve symptoms in 75-85% of patients.   - We can discuss this more at the next appointment if the medications are not working for you.  Your symptoms sound concerning for long covid but this is a diagnosis of exclusion. Drainage can certainly affect your appetite, but I would not expect allergies or drainage to affect your appetite to this degree and/or lead to the amount of weight loss you have experienced.  Would continue to follow with your PCP for these issues.  Recent Sting from Insect  - no signs of allergic reaction - may take tylenol or advil every 6-8 hours as needed for pain - zyrtec 10 mg daily up to twice daily as needed for itching - ice to help with swelling   Follow up : 6-8 weeks or sooner if needed  Control of Dog or Cat Allergen Avoidance is the best way to manage a dog or cat allergy. If you have a dog or cat and are allergic to dog or cats, consider removing the dog or cat from the home. If you have a dog or cat but don't want to find it a new home, or if your family wants a  pet even though someone in the household is allergic, here are some strategies that may help keep symptoms  at bay:  Keep the pet out of your bedroom and restrict it to only a few rooms. Be advised that keeping the dog or cat in only one room will not limit the allergens to that room. Don't pet, hug or kiss the dog or cat; if you do, wash your hands with soap and water. High-efficiency particulate air (HEPA) cleaners run continuously in a bedroom or living room can reduce allergen levels over time. Regular use of a high-efficiency vacuum cleaner or a central vacuum can reduce allergen levels. Giving your dog or cat a bath at least once a week can reduce airborne allergen.  Control of Mold Allergen Mold and fungi can grow on a variety of surfaces provided certain temperature and moisture conditions exist.  Outdoor molds grow on plants, decaying vegetation and soil.  The major outdoor mold, Alternaria and Cladosporium, are found in very high numbers during hot and dry conditions.  Generally, a late Summer - Fall peak is seen for common outdoor fungal spores.  Rain will temporarily lower outdoor mold spore count, but counts rise rapidly when the rainy period ends.  The most important indoor molds are Aspergillus and Penicillium.  Dark, humid and poorly ventilated basements are ideal sites for mold growth.  The next most common sites of mold growth are the bathroom and the kitchen.  Outdoor [employer] Use air conditioning and keep windows closed Avoid exposure to decaying vegetation. Avoid leaf raking. Avoid grain handling. Consider wearing a face mask if working in moldy areas.  Indoor Mold Control Maintain humidity below 50%. Clean washable surfaces with 5% bleach solution. Remove sources e.g. Contaminated carpets.  Control of Dust Mite Allergen Dust mites play a major role in allergic asthma and rhinitis. They occur in environments with high humidity wherever human skin is found. Dust mites absorb humidity from the atmosphere (ie, they do not drink) and feed on organic matter (including shed human  and animal skin). Dust mites are a microscopic type of insect that you cannot see with the naked eye. High levels of dust mites have been detected from mattresses, pillows, carpets, upholstered furniture, bed covers, clothes, soft toys and any woven material. The principal allergen of the dust mite is found in its feces. A gram of dust may contain 1,000 mites and 250,000 fecal particles. Mite antigen is easily measured in the air during house cleaning activities. Dust mites do not bite and do not cause harm to humans, other than by triggering allergies/asthma.  Ways to decrease your exposure to dust mites in your home:  1. Encase mattresses, box springs and pillows with a mite-impermeable barrier or cover  2. Wash sheets, blankets and drapes weekly in hot water (130 F) with detergent and dry them in a dryer on the hot setting.  3. Have the room cleaned frequently with a vacuum cleaner and a damp dust-mop. For carpeting or rugs, vacuuming with a vacuum cleaner equipped with a high-efficiency particulate air (HEPA) filter. The dust mite allergic individual should not be in a room which is being cleaned and should wait 1 hour after cleaning before going into the room.  4. Do not sleep on upholstered furniture (eg, couches).  5. If possible removing carpeting, upholstered furniture and drapery from the home is ideal. Horizontal blinds should be eliminated in the rooms where the person spends the most time (bedroom, study, television room). Washable  vinyl, roller-type shades are optimal.  6. Remove all non-washable stuffed toys from the bedroom. Wash stuffed toys weekly like sheets and blankets above.  7. Reduce indoor humidity to less than 50%. Inexpensive humidity monitors can be purchased at most hardware stores. Do not use a humidifier as can make the problem worse and are not recommended. Troy Settle, FNP [facility]

## 2023-06-21 ENCOUNTER — Other Ambulatory Visit (HOSPITAL_BASED_OUTPATIENT_CLINIC_OR_DEPARTMENT_OTHER): Payer: Self-pay | Admitting: *Deleted

## 2023-06-21 MED ORDER — PRASUGREL HCL 10 MG PO TABS: 10.0000 mg | ORAL_TABLET | Freq: Every day | ORAL | 0 refills | Status: DC

## 2023-07-04 ENCOUNTER — Ambulatory Visit: Payer: 59 | Attending: Cardiology | Admitting: Cardiology

## 2023-07-04 ENCOUNTER — Encounter: Payer: Self-pay | Admitting: Cardiology

## 2023-07-04 ENCOUNTER — Other Ambulatory Visit (HOSPITAL_COMMUNITY): Payer: Self-pay

## 2023-07-04 VITALS — BP 120/56 | HR 73 | Resp 16 | Ht 68.0 in | Wt 163.6 lb

## 2023-07-04 DIAGNOSIS — E1159 Type 2 diabetes mellitus with other circulatory complications: Secondary | ICD-10-CM | POA: Diagnosis not present

## 2023-07-04 DIAGNOSIS — I10 Essential (primary) hypertension: Secondary | ICD-10-CM | POA: Diagnosis not present

## 2023-07-04 DIAGNOSIS — I2510 Atherosclerotic heart disease of native coronary artery without angina pectoris: Secondary | ICD-10-CM | POA: Diagnosis not present

## 2023-07-04 DIAGNOSIS — R0609 Other forms of dyspnea: Secondary | ICD-10-CM

## 2023-07-04 DIAGNOSIS — E782 Mixed hyperlipidemia: Secondary | ICD-10-CM

## 2023-07-04 NOTE — Progress Notes (Signed)
Cardiology Office Note:  .   Date:  07/04/2023  ID:  Troy Martinez, DOB 19-Jan-1961, MRN [id number] PCP:  Street, Stephanie Coup, MD  Former Cardiology Providers: Dr. Hazel [facility] Providers Cardiologist:  Tessa Lerner, DO , [facility] (established care 07/04/23) Electrophysiologist:  None  Click to update primary MD,subspecialty MD or APP then REFRESH:1}    Chief Complaint  Patient presents with   Coronary artery disease involving native coronary artery of   Follow-up    History of Present Illness: .   Troy Martinez is a 62 y.o. Caucasian male whose past medical history and cardiovascular risk factors includes: history of CAD (s/p prior DES to LCX 2008, DES to RCA 2012, DESx2 to ramus 03/2014, recent PTCA to ramus 2 03/2017), GERD, HTN, HLD, obesity, arthritis, DM2.  Formally under the care of Dr. Eldridge Dace who last saw Troy Martinez back in September 2023. I am seeing him for the first time to re-establishing care.   Patient presents today for 1 year follow-up visit. He denies any anginal chest pain but has been experiencing shortness of breath with regular activity.  Overall intensity frequency and duration has not changed. 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 He has not experiences anginal equivalents for him that his throat pain. In the summer months he has not been as active due to his choice but in the winter months he usually enjoys hiking.   Review of Systems: .   Review of Systems  Constitutional: Positive for weight loss.  Cardiovascular:  Positive for dyspnea on exertion. Negative for chest pain, claudication, irregular heartbeat, leg swelling, near-syncope, orthopnea, palpitations, paroxysmal nocturnal dyspnea and syncope.  Respiratory:  Negative for shortness of breath.   Hematologic/Lymphatic: Negative for bleeding problem.  Musculoskeletal:  Negative  for muscle cramps and myalgias.  Neurological:  Negative for dizziness and light-headedness.    Studies Reviewed:   EKG: EKG Interpretation Date/Time:  Tuesday July 04 2023 11:32:30 EDT Ventricular Rate:  73 PR Interval:  140 QRS Duration:  96 QT Interval:  390 QTC Calculation: 429 R Axis:   73  Text Interpretation: Normal sinus rhythm Normal ECG When compared with ECG of 24-Jun-2022 19:34, Premature atrial complexes are no longer Present Confirmed by Tessa Lerner [phone]) on 07/04/2023 11:43:56 AM  Echocardiogram: October 2023: LVEF 55 to 60%, diastolic parameters normal, right ventricular size and function normal, mild MR, estimated RAP 3 mmHg.  Cath in 7/18 showed: Patent RCA stent. Patent circuflex stent. Mid LAD lesion, 25 %stenosed. Ramus-2 lesion, 70 %stenosed, focal area of instent restenosis. The was treated with a 3.0 Wolverine cutting balloon, inflated to 3.25 mm. Post intervention, there is a 0% residual stenosis. The left ventricular systolic function is normal. LV end diastolic pressure is normal. The left ventricular ejection fraction is 55-65% by visual estimate. There is no aortic valve stenosis. Dist Cx lesion, 75 %stenosed. Unchanged from prior in small vessel.  RADIOLOGY: NA  Risk Assessment/Calculations:   NA   Labs:       Latest Ref Rng & Units 06/24/2022    7:40 PM 04/13/2017    2:45 PM 04/05/2017    3:02 PM  CBC  WBC 4.0 - 10.5 K/uL 11.4  6.1  7.5   Hemoglobin 13.0 - 17.0 g/dL 19.1  47.8  29.5   Hematocrit 39.0 - 52.0 % 44.3  39.3  40.3   Platelets  150 - 400 K/uL 239  273  297        Latest Ref Rng & Units 06/24/2022    7:40 PM 02/22/2022   10:25 AM 09/04/2019    8:54 AM  BMP  Glucose 70 - 99 mg/dL 952  841  324   BUN 8 - 23 mg/dL 15  16  15    Creatinine 0.61 - 1.24 mg/dL 4.01  0.27  2.53   BUN/Creat Ratio 10 - 24  22  19    Sodium 135 - 145 mmol/L 139  139  142   Potassium 3.5 - 5.1 mmol/L 4.5  4.1  4.2   Chloride 98 - 111 mmol/L  100  97  101   CO2 22 - 32 mmol/L 29  24  27    Calcium 8.9 - 10.3 mg/dL 66.4  9.5  9.3       Latest Ref Rng & Units 06/24/2022    7:40 PM 04/27/2022    9:04 AM 02/22/2022   10:25 AM  CMP  Glucose 70 - 99 mg/dL 403   474   BUN 8 - 23 mg/dL 15   16   Creatinine 2.59 - 1.24 mg/dL 5.63   8.75   Sodium 643 - 145 mmol/L 139   139   Potassium 3.5 - 5.1 mmol/L 4.5   4.1   Chloride 98 - 111 mmol/L 100   97   CO2 22 - 32 mmol/L 29   24   Calcium 8.9 - 10.3 mg/dL 32.9   9.5   Total Protein 6.5 - 8.1 g/dL 7.8  6.8  7.1   Total Bilirubin 0.3 - 1.2 mg/dL 0.5  0.3  0.4   Alkaline Phos 38 - 126 U/L 49  60  72   AST 15 - 41 U/L 19  41  21   ALT 0 - 44 U/L 17  29  19      Lab Results  Component Value Date   CHOL 96 (L) 04/27/2022   HDL 39 (L) 04/27/2022   LDLCALC 36 04/27/2022   TRIG 116 04/27/2022   CHOLHDL 2.5 04/27/2022   No results for input(s): "LIPOA" in the last 8760 hours. No components found for: "NTPROBNP" No results for input(s): "PROBNP" in the last 8760 hours. No results for input(s): "TSH" in the last 8760 hours.   Physical Exam:    Today's Vitals   07/04/23 1129  BP: (!) 120/56  Pulse: 73  Resp: 16  SpO2: 98%  Weight: 163 lb 9.6 oz (74.2 kg)  Height: 5\' 8"  (1.727 m)   Body mass index is 24.88 kg/m. Wt Readings from Last 3 Encounters:  07/04/23 163 lb 9.6 oz (74.2 kg)  06/24/22 163 lb (73.9 kg)  06/17/22 168 lb 6.4 oz (76.4 kg)    Physical Exam  Constitutional: No distress.  hemodynamically stable  Neck: No JVD present.  Cardiovascular: Normal rate, regular rhythm, S1 normal, S2 normal, intact distal pulses and normal pulses. Exam reveals no gallop, no S3 and no S4.  No murmur heard. Pulmonary/Chest: Effort normal and breath sounds normal. No stridor. He has no wheezes. He has no rales.  Abdominal: Soft. Bowel sounds are normal. He exhibits no distension. There is no abdominal tenderness.  Musculoskeletal:        General: No edema.     Cervical back: Neck  supple.  Neurological: He is alert and oriented to person, place, and time. He has intact cranial nerves (2-12).  Skin: Skin is warm  and moist.    Impression & Recommendation(s):  Impression:   ICD-10-CM   1. Coronary artery disease involving native coronary artery of native heart without angina pectoris  I25.10 EKG 12-Lead    Lipid Profile    Comp Met (CMET)    MYOCARDIAL PERFUSION IMAGING    Cardiac Stress Test: Informed Consent Details: Physician/Practitioner Attestation; Transcribe to consent form and obtain patient signature    2. Dyspnea on exertion  R06.09 Lipid Profile    Comp Met (CMET)    MYOCARDIAL PERFUSION IMAGING    Cardiac Stress Test: Informed Consent Details: Physician/Practitioner Attestation; Transcribe to consent form and obtain patient signature    3. Essential hypertension, benign  I10 Lipid Profile    Comp Met (CMET)    4. Type 2 diabetes mellitus with other circulatory complication, without long-term current use of insulin (HCC)  E11.59 Lipid Profile    Comp Met (CMET)    5. Mixed hyperlipidemia  E78.2 Lipid Profile    Comp Met (CMET)       Recommendation(s):  Coronary artery disease involving native coronary artery of native heart without angina pectoris Dyspnea on exertion Known history of coronary artery disease with prior coronary interventions. Originally followed by Dr. Eldridge Dace and now presents for year follow up and to re-establish care.  Denies anginal chest pain but has been experiencing dyspnea on exertion with very minimal activity. The shortness of breath still persist despite the unintentional weight loss that he has experienced. No recent ischemic workup His last coronary interventions were in 2018. EKG is nonischemic Antianginal therapy: Sublingual nitroglycerin tablets Exercise nuclear stress test to evaluate for functional capacity and reversible ischemia.  Further recommendations to follow  Essential hypertension, benign Office  blood pressures are well-controlled. Continue Farxiga 10 mg p.o. daily. Continue lisinopril 5 mg p.o. daily. Reemphasized importance of a low-salt diet  Type 2 diabetes mellitus with other circulatory complication, without long-term current use of insulin (HCC) Reemphasized the importance of glycemic control. No recent labs available for review.  Mixed hyperlipidemia Currently on rosuvastatin, Zetia.   He denies myalgia or other side effects. No recent labs available for review  Will check fasting lipids and CMP.  Orders Placed:  Orders Placed This Encounter  Procedures   Lipid Profile    Standing Status:   Future    Standing Expiration Date:   07/03/2024   Comp Met (CMET)    Standing Status:   Future    Standing Expiration Date:   07/03/2024   Cardiac Stress Test: Informed Consent Details: Physician/Practitioner Attestation; Transcribe to consent form and obtain patient signature    Order Specific Question:   Physician/Practitioner attestation of informed consent for procedure/surgical case    Answer:   I, the physician/practitioner, attest that I have discussed with the patient the benefits, risks, side effects, alternatives, likelihood of achieving goals and potential problems during recovery for the procedure that I have provided informed consent.    Order Specific Question:   Procedure    Answer:   Exercise nuclear stress test    Order Specific Question:   Indication/Reason    Answer:   CAD, dyspnea   MYOCARDIAL PERFUSION IMAGING    Standing Status:   Future    Standing Expiration Date:   07/03/2024    Order Specific Question:   Patient weight in lbs    Answer:   163    Order Specific Question:   Where should this be performed?    Answer:   [facility] Specific Question:   Type of stress    Answer:   Exercise   EKG 12-Lead    As part of medical decision making results of the echo, heart catheterization report from 2018, EKG were  reviewed independently at today's visit.   Final Medication List:   No orders of the defined types were placed in this encounter.   Medications Discontinued During This Encounter  Medication Reason   Multiple Vitamins-Minerals (MULTIVITAMIN PO)    naproxen (NAPROSYN) 500 MG tablet      Current Outpatient Medications:    acetaminophen (TYLENOL) 500 MG tablet, Take 2 tablets (1,000 mg total) by mouth daily as needed for moderate pain or headache., Disp: , Rfl:    buPROPion (WELLBUTRIN XL) 300 MG 24 hr tablet, Take 300 mg by mouth daily., Disp: , Rfl:    esomeprazole (NEXIUM) 20 MG capsule, Take 20 mg by mouth daily at 12 noon., Disp: , Rfl:    ezetimibe (ZETIA) 10 MG tablet, Take 1 tablet (10 mg total) by mouth daily., Disp: 90 tablet, Rfl: 1   FARXIGA 10 MG TABS tablet, Take 10 mg by mouth daily., Disp: , Rfl:    ibuprofen (ADVIL) 200 MG tablet, Take 800 mg by mouth every 6 (six) hours as needed for mild pain., Disp: , Rfl:    JANUMET XR 50-1000 MG TB24, Take 1 tablet by mouth 2 (two) times daily. , Disp: , Rfl: 3   lisinopril (ZESTRIL) 5 MG tablet, Take 1 tablet (5 mg total) by mouth daily. Please call our office to schedule an yearly appointment for any future refills  [phone]. Thank you 2nd attempt, Disp: 90 tablet, Rfl: 0   nitroGLYCERIN (NITROSTAT) 0.4 MG SL tablet, Place 1 tablet (0.4 mg total) under the tongue every 5 (five) minutes as needed for chest pain. Please call our office to schedule an yearly appointment for any future refills  [phone]. Thank you 2nd attempt, Disp: 25 tablet, Rfl: 0   prasugrel (EFFIENT) 10 MG TABS tablet, Take 1 tablet (10 mg total) by mouth daily. 1st attempt. Patient needs a yearly appointment for # 90 day supply or any future refills. Please call office to schedule appt., Disp: 30 tablet, Rfl: 0   rosuvastatin (CRESTOR) 40 MG tablet, Take 1 tablet (40 mg total) by mouth daily. Please call our office to schedule an yearly appointment for any future  refills  [phone]. Thank you 2nd attempt, Disp: 90 tablet, Rfl: 0  Consent:   Informed Consent   Shared Decision Making/Informed Consent The risks [chest pain, shortness of breath, cardiac arrhythmias, dizziness, blood pressure fluctuations, myocardial infarction, stroke/transient ischemic attack, nausea, vomiting, allergic reaction, radiation exposure, metallic taste sensation and life-threatening complications (estimated to be 1 in 10,000)], benefits (risk stratification, diagnosing coronary artery disease, treatment guidance) and alternatives of a nuclear stress test were discussed in detail with Mr. Vanbergen and he agrees to proceed.     Disposition:   Would like him to follow-up with APP in 7 weeks to reevaluate symptoms and review stress test results and lipids and I would like to see him back in a year sooner if needed.  Patient is agreeable with the plan of care and his questions and concerns addressed.   Signed, Tessa Lerner, DO, [facility]  [facility]  [address] [place], [place] [address] 07/04/2023 12:57 PM

## 2023-07-04 NOTE — Telephone Encounter (Signed)
ERROR

## 2023-07-04 NOTE — Patient Instructions (Signed)
Medication Instructions:  Your physician recommends that you continue on your current medications as directed. Please refer to the Current Medication list given to you today.  *If you need a refill on your cardiac medications before your next appointment, please call your pharmacy*  Lab Work: FASTING CMP, Direct LDL, Lipid Panel to be completed at same appointment as the stress test.  Fasting is required/ not required.  If you have labs (blood work) drawn today and your tests are completely normal, you will receive your results only by: MyChart Message (if you have MyChart) OR A paper copy in the mail If you have any lab test that is abnormal or we need to change your treatment, we will call you to review the results.  Testing/Procedures: Exercise Myoview (Stress Test) Instructions  Please arrive 15 minutes prior to your appointment time for registration and insurance purposes.   The test will take approximately 3 to 4 hours to complete; you may bring reading material.  If someone comes with you to your appointment, they will need to remain in the main lobby due to limited space in the testing area. **If you are pregnant or breastfeeding, please notify the nuclear lab prior to your appointment**   How to prepare for your Myocardial Perfusion Test: Do not eat or drink 3 hours prior to your test, except you may have water. Do not consume products containing caffeine (regular or decaffeinated) 12 hours prior to your test. (ex: coffee, chocolate, sodas, tea). Do bring a list of current medications with you. You may take your medications as normal. Do wear comfortable clothes (no dresses or overalls) and walking shoes, tennis shoes preferred (No heels or open toe shoes are allowed). Do NOT wear cologne, perfume, aftershave, or lotions (deodorant is allowed). If these instructions are not followed, your test will have to be rescheduled.   Please report to [address], [address] for  your test.  If you have questions or concerns about your appointment, you can call the Nuclear Lab at [phone].   If you cannot keep your appointment, please provide 24 hours notification to the Nuclear Lab, to avoid a possible $50 charge to your account.   Follow-Up: At [facility], you and your health needs are our priority.  As part of our continuing mission to provide you with exceptional heart care, we have created designated Provider Care Teams.  These Care Teams include your primary Cardiologist (physician) and Advanced Practice Providers (APPs -  Physician Assistants and Nurse Practitioners) who all work together to provide you with the care you need, when you need it.  We recommend signing up for the patient portal called "MyChart".  Sign up information is provided on this After Visit Summary.  MyChart is used to connect with patients for Virtual Visits (Telemedicine).  Patients are able to view lab/test results, encounter notes, upcoming appointments, etc.  Non-urgent messages can be sent to your provider as well.   To learn more about what you can do with MyChart, go to [email or web address].    Your next appointment:   7 weeks  The format for your next appointment:   In Person  Provider:   Jari Favre, PA-C, Ronie Spies, PA-C, Robin Searing, NP, Eligha Bridegroom, NP, Tereso Newcomer, PA-C, or Perlie Gold, PA-C. Then, [employer], DO will plan to see you again in 1 year(s).{

## 2023-07-13 ENCOUNTER — Telehealth (HOSPITAL_COMMUNITY): Payer: Self-pay

## 2023-07-13 NOTE — Telephone Encounter (Signed)
Detailed instructions left on the patient's answering machine. Asked to call back with any questions. S.Aby Gessel CCT

## 2023-07-18 ENCOUNTER — Ambulatory Visit: Payer: 59

## 2023-07-18 ENCOUNTER — Ambulatory Visit (HOSPITAL_COMMUNITY): Payer: 59 | Attending: Cardiology

## 2023-07-18 DIAGNOSIS — E1159 Type 2 diabetes mellitus with other circulatory complications: Secondary | ICD-10-CM | POA: Insufficient documentation

## 2023-07-18 DIAGNOSIS — R0609 Other forms of dyspnea: Secondary | ICD-10-CM

## 2023-07-18 DIAGNOSIS — E782 Mixed hyperlipidemia: Secondary | ICD-10-CM

## 2023-07-18 DIAGNOSIS — I2510 Atherosclerotic heart disease of native coronary artery without angina pectoris: Secondary | ICD-10-CM | POA: Diagnosis present

## 2023-07-18 DIAGNOSIS — I10 Essential (primary) hypertension: Secondary | ICD-10-CM

## 2023-07-18 LAB — MYOCARDIAL PERFUSION IMAGING
Estimated workload: 11.7
Exercise duration (min): 10 min
Exercise duration (sec): 0 s
LV dias vol: 98 mL (ref 62–150)
LV sys vol: 37 mL
MPHR: 158 {beats}/min
Nuc Stress EF: 62 %
Peak HR: 150 {beats}/min
Percent HR: 94 %
Rest HR: 73 {beats}/min
Rest Nuclear Isotope Dose: 8.3 mCi
SDS: 0
SRS: 0
SSS: 0
ST Depression (mm): 1 mm
Stress Nuclear Isotope Dose: 30.6 mCi
TID: 0.83

## 2023-07-18 MED ORDER — TECHNETIUM TC 99M TETROFOSMIN IV KIT
8.3000 | PACK | Freq: Once | INTRAVENOUS | Status: AC | PRN
  Administered 2023-07-18: 8.3 via INTRAVENOUS

## 2023-07-18 MED ORDER — TECHNETIUM TC 99M TETROFOSMIN IV KIT
30.6000 | PACK | Freq: Once | INTRAVENOUS | Status: AC | PRN
  Administered 2023-07-18: 30.6 via INTRAVENOUS

## 2023-07-19 LAB — COMPREHENSIVE METABOLIC PANEL
ALT: 20 [IU]/L (ref 0–44)
AST: 24 [IU]/L (ref 0–40)
Albumin: 4.5 g/dL (ref 3.9–4.9)
Alkaline Phosphatase: 56 [IU]/L (ref 44–121)
BUN/Creatinine Ratio: 12 (ref 10–24)
BUN: 8 mg/dL (ref 8–27)
Bilirubin Total: 0.5 mg/dL (ref 0.0–1.2)
CO2: 27 mmol/L (ref 20–29)
Calcium: 9.3 mg/dL (ref 8.6–10.2)
Chloride: 102 mmol/L (ref 96–106)
Creatinine, Ser: 0.68 mg/dL — ABNORMAL LOW (ref 0.76–1.27)
Globulin, Total: 2.2 g/dL (ref 1.5–4.5)
Glucose: 122 mg/dL — ABNORMAL HIGH (ref 70–99)
Potassium: 4.5 mmol/L (ref 3.5–5.2)
Sodium: 142 mmol/L (ref 134–144)
Total Protein: 6.7 g/dL (ref 6.0–8.5)
eGFR: 105 mL/min/{1.73_m2} (ref >59)

## 2023-07-19 LAB — LIPID PANEL
Chol/HDL Ratio: 2 ratio (ref 0.0–5.0)
Cholesterol, Total: 96 mg/dL — ABNORMAL LOW (ref 100–199)
HDL: 47 mg/dL (ref >39)
LDL Chol Calc (NIH): 35 mg/dL (ref 0–99)
Triglycerides: 64 mg/dL (ref 0–149)
VLDL Cholesterol Cal: 14 mg/dL (ref 5–40)

## 2023-07-24 ENCOUNTER — Ambulatory Visit: Payer: Self-pay | Admitting: Family

## 2023-07-24 ENCOUNTER — Other Ambulatory Visit: Payer: Self-pay

## 2023-07-24 DIAGNOSIS — J309 Allergic rhinitis, unspecified: Secondary | ICD-10-CM

## 2023-07-24 MED ORDER — LISINOPRIL 5 MG PO TABS: 5.0000 mg | ORAL_TABLET | Freq: Every day | ORAL | 3 refills | Status: DC

## 2023-07-24 MED ORDER — EZETIMIBE 10 MG PO TABS: 10.0000 mg | ORAL_TABLET | Freq: Every day | ORAL | 3 refills | Status: DC

## 2023-07-24 MED ORDER — ROSUVASTATIN CALCIUM 40 MG PO TABS: 40.0000 mg | ORAL_TABLET | Freq: Every day | ORAL | 3 refills | Status: DC

## 2023-07-28 ENCOUNTER — Other Ambulatory Visit (HOSPITAL_COMMUNITY): Payer: Self-pay

## 2023-07-28 ENCOUNTER — Other Ambulatory Visit: Payer: Self-pay | Admitting: Cardiology

## 2023-07-28 DIAGNOSIS — I10 Essential (primary) hypertension: Secondary | ICD-10-CM

## 2023-07-28 DIAGNOSIS — R0609 Other forms of dyspnea: Secondary | ICD-10-CM

## 2023-07-28 MED ORDER — LISINOPRIL 20 MG PO TABS
20.0000 mg | ORAL_TABLET | Freq: Every day | ORAL | 3 refills | Status: DC
  Filled 2023-07-28: qty 30, 30d supply, fill #0

## 2023-07-28 NOTE — Progress Notes (Signed)
Spoke to the patient over the phone regarding his stress test results.  No obvious reversible perfusion defect noted on SPECT images.  The inferior segments illustrate homogenous uptake in the upright images.  Patient was noted to have hypertensive response to exercise.  Clinically he does not have any chest pain Shortness of breath also remains relatively stable  No use of sublingual nitroglycerin tablets  Shared decision is to uptitrate antihypertensive medications.  Will increase lisinopril from 5 mg p.o. daily to 20 mg p.o. every pm and blood work in 1 week to evaluate kidney function.  Patient is asked to seek medical attention if he has new onset of chest pain or worsening shortness of breath.  He has an appointment to follow-up in December 2024 he is asked to keep the appointment for reevaluation.  Sanora Cunanan Sabattus, DO, [facility]

## 2023-08-09 ENCOUNTER — Other Ambulatory Visit (HOSPITAL_COMMUNITY): Payer: Self-pay

## 2023-08-15 ENCOUNTER — Other Ambulatory Visit: Payer: Self-pay

## 2023-08-15 MED ORDER — PRASUGREL HCL 10 MG PO TABS: 10.0000 mg | ORAL_TABLET | Freq: Every day | ORAL | 3 refills | Status: DC

## 2023-08-21 NOTE — Progress Notes (Unsigned)
Cardiology Office Note:  .   Date:  08/22/2023  ID:  Biagio Quint, DOB [birth year], MRN [id number] PCP: Street, Stephanie Coup, MD  [facility] Providers Cardiologist:  Jaxten Brosh Lerner, DO {    History of Present Illness: .   Troy Martinez is a 62 y.o. male with a past medical history of CAD (status post prior DES to LCx 2008, DES to RCA 2012, DES x 2 to ramus 03/2014, recent PTCA to ramus 2 03/2017), GERD, HTN, HLD, obesity, arthritis, and DM 2 here for follow-up appointment.  Formally seen by Dr. Eldridge Dace and care was transferred to Dr. Odis Hollingshead.  Patient presented for 1 year follow-up visit and at that time denied any anginal chest pain but had been experiencing shortness of breath with regular activity.  Overall intensity frequency and duration was not changed.  He continued to experience unintentional weight loss and patient stated that he had not had a work for the past and had been attributed to long COVID.  Had not experienced anginal equivalents for him which is throat pain.  In the summer months he had not been as active due to his choice but in the winter months usually enjoys hiking.  Today, he presents with a history of angina and diabetes  for an annual review. He reports ongoing symptoms of long COVID, including unintentional weight loss, which he has been experiencing for the past two years. He describes variable appetite, with some days where he can eat normally and others where he cannot. Despite this, he reports that he has been able to maintain his weight. Other symptoms of long COVID include sinus drainage and occasional coughing. These symptoms have been stable for the past several months to a year.  The patient also reports a unique angina equivalent, described as throat pain. He has not experienced this symptom recently. He underwent a stress test, the results of which were discussed during this visit. The patient also reports a hypertensive response to exercise, but no  chest pain or shortness of breath. He has been taking lisinopril for hypertension, but has been off the medication for the past two weeks due to running out of the medication while out of town.  The patient also has diabetes, which is managed by his primary care provider. He is currently taking [place] and Janumet for this condition.  Reports no shortness of breath nor dyspnea on exertion. Reports no chest pain, pressure, or tightness. No edema, orthopnea, PND. Reports no palpitations.   Discussed the use of AI scribe software for clinical note transcription with the patient, who gave verbal consent to proceed.  ROS: Pertinent ROS in HPI  Studies Reviewed: Marland Kitchen       Eugenie Birks Myoview 07/18/2023    Findings are consistent with infarction with peri-infarct ischemia. The study is intermediate risk.   LV perfusion is abnormal. Defect 1: There is a medium defect with moderate reduction in uptake present in the apical inferior location(s) that is partially reversible. There is normal wall motion in the defect area. Consistent with peri-infarct ischemia.   Hypertensive blood pressure response noted during stress.   1.0 mm of up sloping ST depression (II, III, aVF, V4 and V5) was noted.   Hypertensive blood pressure response noted during stress.   Left ventricular function is normal. Nuclear stress EF: 62%. The left ventricular ejection fraction is normal (55-65%). End diastolic cavity size is normal.   Prior study available for comparison from 01/11/2013.     Physical  Exam:   VS:  BP 114/62   Pulse 78   Ht 5\' 8"  (1.727 m)   Wt 163 lb 6.4 oz (74.1 kg)   SpO2 97%   BMI 24.84 kg/m    Wt Readings from Last 3 Encounters:  08/22/23 163 lb 6.4 oz (74.1 kg)  07/04/23 163 lb 9.6 oz (74.2 kg)  06/24/22 163 lb (73.9 kg)    GEN: Well nourished, well developed in no acute distress NECK: No JVD; No carotid bruits CARDIAC: RRR, no murmurs, rubs, gallops RESPIRATORY:  Clear to auscultation without rales,  wheezing or rhonchi  ABDOMEN: Soft, non-tender, non-distended EXTREMITIES:  No edema; No deformity   ASSESSMENT AND PLAN: .   Long COVID Unintentional weight loss, cough, and sinus drainage. Stable for the past few months. -Continue current management and monitor symptoms. -he is able to hike 4 miles with a 40-50 lb backpack  Angina/CAD s/p PCI (most recent intervention 2018) No recent episodes of throat pain, which is the patient's angina equivalent. Stable since last cardiac catheterization. -Continue current management and patient to seek medical attention if new onset of throat pain or shortness of breath.  Hypertension Lisinopril dose increased from 5mg  to 20mg  by Dr. Odis Hollingshead. Some confusion on dosage. -Resume Lisinopril at 20mg  daily. -Check blood pressure regularly. -Order BMP in 1 week to check kidney function after resuming Lisinopril.  Hyperlipidemia Lipid panel in October showed good control with Crestor. LDL 35 -Continue Crestor.  Anticoagulation On Effient. Reports prolonged bleeding with minor injuries. -Continue Effient. Advise patient to be cautious to avoid injuries.  Diabetes Managed by primary care provider. Last A1C was 7.7 in February. -Continue current management with [place] and Janumet. Follow up with primary care provider for A1C check.      Dispo: He can f/u in a year with Dr. Odis Hollingshead  Signed, Sharlene Dory, PA-C

## 2023-08-22 ENCOUNTER — Encounter: Payer: Self-pay | Admitting: Physician Assistant

## 2023-08-22 ENCOUNTER — Ambulatory Visit: Payer: 59 | Attending: Physician Assistant | Admitting: Physician Assistant

## 2023-08-22 ENCOUNTER — Other Ambulatory Visit: Payer: Self-pay | Admitting: *Deleted

## 2023-08-22 VITALS — BP 114/62 | HR 78 | Ht 68.0 in | Wt 163.4 lb

## 2023-08-22 DIAGNOSIS — E785 Hyperlipidemia, unspecified: Secondary | ICD-10-CM

## 2023-08-22 DIAGNOSIS — I2510 Atherosclerotic heart disease of native coronary artery without angina pectoris: Secondary | ICD-10-CM | POA: Diagnosis not present

## 2023-08-22 DIAGNOSIS — I10 Essential (primary) hypertension: Secondary | ICD-10-CM | POA: Diagnosis not present

## 2023-08-22 DIAGNOSIS — R0609 Other forms of dyspnea: Secondary | ICD-10-CM

## 2023-08-22 DIAGNOSIS — E1159 Type 2 diabetes mellitus with other circulatory complications: Secondary | ICD-10-CM

## 2023-08-22 DIAGNOSIS — E782 Mixed hyperlipidemia: Secondary | ICD-10-CM

## 2023-08-22 MED ORDER — NITROGLYCERIN 0.4 MG SL SUBL: 0.4000 mg | SUBLINGUAL_TABLET | SUBLINGUAL | 3 refills | Status: DC | PRN

## 2023-08-22 MED ORDER — LISINOPRIL 20 MG PO TABS: 20.0000 mg | ORAL_TABLET | Freq: Every day | ORAL | 3 refills | Status: DC

## 2023-08-22 NOTE — Patient Instructions (Signed)
Medication Instructions:  Your physician recommends that you continue on your current medications as directed. Please refer to the Current Medication list given to you today.  *If you need a refill on your cardiac medications before your next appointment, please call your pharmacy*   Lab Work: 1 WEEK:  COME BACK FOR BMET  If you have labs (blood work) drawn today and your tests are completely normal, you will receive your results only by: MyChart Message (if you have MyChart) OR A paper copy in the mail If you have any lab test that is abnormal or we need to change your treatment, we will call you to review the results.   Testing/Procedures: None ordered   Follow-Up: At [facility], you and your health needs are our priority.  As part of our continuing mission to provide you with exceptional heart care, we have created designated Provider Care Teams.  These Care Teams include your primary Cardiologist (physician) and Advanced Practice Providers (APPs -  Physician Assistants and Nurse Practitioners) who all work together to provide you with the care you need, when you need it.  We recommend signing up for the patient portal called "MyChart".  Sign up information is provided on this After Visit Summary.  MyChart is used to connect with patients for Virtual Visits (Telemedicine).  Patients are able to view lab/test results, encounter notes, upcoming appointments, etc.  Non-urgent messages can be sent to your provider as well.   To learn more about what you can do with MyChart, go to [email or web address].    Your next appointment:   12 month(s)  Provider:   Tessa Lerner, DO     Other Instructions

## 2023-10-31 ENCOUNTER — Other Ambulatory Visit (HOSPITAL_COMMUNITY): Payer: Self-pay

## 2024-01-15 ENCOUNTER — Other Ambulatory Visit (HOSPITAL_COMMUNITY): Payer: Self-pay | Admitting: Student

## 2024-01-15 DIAGNOSIS — R110 Nausea: Secondary | ICD-10-CM

## 2024-01-24 ENCOUNTER — Encounter (HOSPITAL_COMMUNITY)

## 2024-01-24 ENCOUNTER — Encounter (HOSPITAL_COMMUNITY): Payer: Self-pay

## 2024-08-04 ENCOUNTER — Other Ambulatory Visit: Payer: Self-pay | Admitting: Cardiology

## 2024-08-13 ENCOUNTER — Other Ambulatory Visit: Payer: Self-pay | Admitting: Cardiology

## 2024-08-20 ENCOUNTER — Other Ambulatory Visit: Payer: Self-pay | Admitting: Cardiology

## 2024-08-26 ENCOUNTER — Other Ambulatory Visit: Payer: Self-pay

## 2024-08-29 MED ORDER — NITROGLYCERIN 0.4 MG SL SUBL: 0.4000 mg | SUBLINGUAL_TABLET | SUBLINGUAL | 0 refills | Status: AC | PRN

## 2024-09-02 ENCOUNTER — Telehealth: Payer: Self-pay | Admitting: Cardiology

## 2024-09-02 NOTE — Telephone Encounter (Signed)
Sure. TY.  

## 2024-09-02 NOTE — Telephone Encounter (Signed)
 Pt spouse calling to switch to Dr. Liborio in [place] since they live there, is this switch okay?

## 2024-09-10 ENCOUNTER — Other Ambulatory Visit: Payer: Self-pay

## 2024-09-10 MED ORDER — LISINOPRIL 20 MG PO TABS: 20.0000 mg | ORAL_TABLET | Freq: Every day | ORAL | 0 refills | Status: AC

## 2024-09-17 ENCOUNTER — Other Ambulatory Visit: Payer: Self-pay | Admitting: Cardiology

## 2024-10-03 ENCOUNTER — Other Ambulatory Visit: Payer: Self-pay | Admitting: Cardiology

## 2024-10-04 NOTE — Telephone Encounter (Signed)
 In accordance with refill protocols, please review and address the following requirements before this medication refill can be authorized:  Labs

## 2024-10-07 ENCOUNTER — Other Ambulatory Visit: Payer: Self-pay | Admitting: Cardiology

## 2024-10-13 NOTE — Telephone Encounter (Signed)
 Refill until the next office visit.  Dr. Claudeen Leason

## 2024-10-14 NOTE — Telephone Encounter (Signed)
 Overdue Labs  In accordance with refill protocols, please review and address the following requirements before this medication refill can be authorized:  Labs

## 2024-10-15 MED ORDER — PRASUGREL HCL 10 MG PO TABS: 10.0000 mg | ORAL_TABLET | Freq: Every day | ORAL | 0 refills | Status: AC

## 2024-10-19 ENCOUNTER — Other Ambulatory Visit: Payer: Self-pay | Admitting: Cardiology

## 2024-11-08 ENCOUNTER — Ambulatory Visit: Admitting: Cardiology
